# Patient Record
Sex: Male | Born: 1945 | Race: White | Hispanic: No | Marital: Married | Smoking: Never smoker
Health system: Southern US, Community
[De-identification: ages and names within clinical notes are randomized; demographics above are authoritative.]

## PROBLEM LIST (undated history)

## (undated) DIAGNOSIS — J302 Other seasonal allergic rhinitis: Secondary | ICD-10-CM

## (undated) DIAGNOSIS — I10 Essential (primary) hypertension: Secondary | ICD-10-CM

## (undated) DIAGNOSIS — K219 Gastro-esophageal reflux disease without esophagitis: Secondary | ICD-10-CM

## (undated) DIAGNOSIS — R7301 Impaired fasting glucose: Secondary | ICD-10-CM

## (undated) DIAGNOSIS — I2510 Atherosclerotic heart disease of native coronary artery without angina pectoris: Secondary | ICD-10-CM

## (undated) DIAGNOSIS — D369 Benign neoplasm, unspecified site: Secondary | ICD-10-CM

## (undated) HISTORY — DX: Impaired fasting glucose: R73.01

## (undated) HISTORY — DX: Benign neoplasm, unspecified site: D36.9

## (undated) HISTORY — DX: Atherosclerotic heart disease of native coronary artery without angina pectoris: I25.10

## (undated) HISTORY — PX: KNEE ARTHROSCOPY: SHX127

---

## 2005-05-17 ENCOUNTER — Other Ambulatory Visit: Admission: RE | Admit: 2005-05-17 | Discharge: 2005-05-17 | Payer: Self-pay | Admitting: Dermatology

## 2005-10-03 ENCOUNTER — Ambulatory Visit: Payer: Self-pay | Admitting: Internal Medicine

## 2005-10-03 ENCOUNTER — Ambulatory Visit (HOSPITAL_COMMUNITY): Admission: RE | Admit: 2005-10-03 | Discharge: 2005-10-03 | Payer: Self-pay | Admitting: Internal Medicine

## 2005-10-03 ENCOUNTER — Encounter: Payer: Self-pay | Admitting: Internal Medicine

## 2005-10-03 HISTORY — PX: COLONOSCOPY: SHX174

## 2011-04-06 NOTE — Op Note (Signed)
NAME:  Roger [name], Roger [name]              ACCOUNT NO.:  [id number]   MEDICAL RECORD NO.:  [id number]          PATIENT TYPE:  AMB   LOCATION:  DAY                           FACILITY:  [facility]   PHYSICIAN:  Roger [name], M.D. DATE OF BIRTH:  [birth year]   DATE OF PROCEDURE:  10/03/2005  DATE OF DISCHARGE:                                 OPERATIVE REPORT   PROCEDURE:  Colonoscopy with snare polypectomy and biopsy.   INDICATIONS FOR PROCEDURE:  The patient is a 65 year old gentleman sent over  at the courtesy of Dr. Nobie [name] for colorectal cancer screening. He is  devoid of any lower GI tract symptoms. He has never had his colon imaged.  There is no family history of colorectal neoplasia although his dad did have  history of liver cancer; whether this is primary or metastatic is unknown.  Colonoscopy is now being done as a screening maneuver. This approach has  been discussed with the patient at length. Potential risks, benefits, and  alternatives have been reviewed and questions answered. Please see  documentation in the medical record.   PROCEDURE NOTE:  O2 saturation, blood pressure, pulse, and respirations were  monitored throughout the entire procedure. Conscious sedation with Versed 4  mg IV and Demerol 75 mg IV in divided doses.   INSTRUMENT:  Olympus video chip system.   FINDINGS:  Digital rectal exam revealed no abnormalities.   ENDOSCOPIC FINDINGS:  Prep was adequate.   Rectum:  Examination of the rectal mucosa including retroflexed view of the  anal verge revealed a 3-mm polyp at 10 cm. Otherwise rectal mucosa appeared  normal.   Colon:  Colonic mucosa was surveyed from the rectosigmoid junction through  the left, transverse, and right colon to the area of the appendiceal  orifice, ileocecal valve, and cecum. These structures were well seen and  photographed for the record. From this level, the scope was slowly  withdrawn, and all previously mentioned mucosal surfaces were  again seen.  The patient was noted to have left sided diverticula. There was a 5-mm polyp  at the base of the ileocecal valve which was removed with cold snare  technique. There was a 3-mm diminutive polyp mid ascending colon which was  cold biopsied/removed. The remainder of the colonic mucosa appeared normal.  The lesion in the rectum was cold biopsied. The patient tolerated the  procedure well and was reactive to endoscopy.   IMPRESSION:  1.  Diminutive rectal polyp. Otherwise normal rectum status post removal of      cold biopsy technique.  2.  Left sided diverticula. Polyp at ileocecal valve removed with cold snare      technique. Mid ascending colon diminutive polyp removed with cold biopsy      technique. The remainder of colonic mucosa appeared normal.   RECOMMENDATIONS:  1.  No aspirin or arthritis medications for 10 days.  2.  Diverticulosis literature provided to Roger [name].  3.  Follow up on pathology.  4.  Further recommendations to follow.      Roger [name], M.D.  Electronically Signed     RMR/MEDQ  D:  10/03/2005  T:  10/03/2005  Job:  [id number]

## 2012-01-23 ENCOUNTER — Telehealth: Payer: Self-pay

## 2012-01-23 NOTE — Telephone Encounter (Signed)
Pt referred from Dr. Lubertha South for colonoscopy. Hx of adenomatous polyp. Needs OV prior to scheduling. LMOM for pt to return call.

## 2012-01-24 NOTE — Telephone Encounter (Signed)
LMOM for pt to return call. 

## 2012-01-29 ENCOUNTER — Encounter: Payer: Self-pay | Admitting: Internal Medicine

## 2012-01-29 NOTE — Telephone Encounter (Signed)
Pt called and left VM with his cell phone number of [phone], for a return call.

## 2012-01-29 NOTE — Telephone Encounter (Signed)
Called and spoke with pt's wife. She said they were out of town for a few days. Pt has the number and is to call today. She is aware that he does need OV prior to procedure. But she said he knows his schedule because he works out of town some. ( Last colonoscopy was 10/03/2005 by Dr. Jena Gauss. Adenomatous polyps).

## 2012-01-29 NOTE — Telephone Encounter (Signed)
Pt is scheduled for OV with [name], NP on 01/30/2012 @ 2:30 PM.

## 2012-01-30 ENCOUNTER — Ambulatory Visit (INDEPENDENT_AMBULATORY_CARE_PROVIDER_SITE_OTHER): Payer: Medicare Other | Admitting: Gastroenterology

## 2012-01-30 ENCOUNTER — Other Ambulatory Visit: Payer: Self-pay

## 2012-01-30 ENCOUNTER — Encounter: Payer: Self-pay | Admitting: Gastroenterology

## 2012-01-30 VITALS — BP 149/78 | HR 76 | Temp 98.5°F | Ht 72.0 in | Wt 199.2 lb

## 2012-01-30 DIAGNOSIS — D369 Benign neoplasm, unspecified site: Secondary | ICD-10-CM | POA: Diagnosis not present

## 2012-01-30 NOTE — Patient Instructions (Signed)
You have been set up for a colonoscopy with Dr. Jena Gauss.   Further recommendations to follow once this is completed.

## 2012-01-30 NOTE — Progress Notes (Signed)
Faxed to PCP

## 2012-01-30 NOTE — Progress Notes (Signed)
Primary Care Physician:  Harlow Asa, MD, MD Primary Gastroenterologist:  Dr. Jena Gauss  Chief Complaint  Patient presents with  . Colonoscopy    HPI:   Roger [name] is a very healthy 66 year old male who presents today for a visit prior to surveillance colonoscopy. Hx of adenomatous polyps in 2006. Denies any abdominal pain, rectal bleeding, change in bowel habits, wt loss, lack of appetite, or any other issues. He has no upper GI symptoms. Doing well.    Past Medical History  Diagnosis Date  . Adenomatous polyps     Past Surgical History  Procedure Date  . Colonoscopy 10/03/05    inflamed hyperplastic polyp/adenomatous poylp, left sided diverticula  . Knee surgery     both    Current Outpatient Prescriptions  Medication Sig Dispense Refill  . aspirin 81 MG tablet Take 81 mg by mouth daily.      . Multiple Vitamin (MULTIVITAMIN) capsule Take 1 capsule by mouth daily.        Allergies as of 01/30/2012  . (No Known Allergies)    Family History  Problem Relation Age of Onset  . Colon cancer      unsure  . Liver cancer Father     History   Social History  . Marital Status: Married    Spouse Name: N/A    Number of Children: N/A  . Years of Education: N/A   Occupational History  . Not on file.   Social History Main Topics  . Smoking status: Never Smoker   . Smokeless tobacco: Not on file  . Alcohol Use: Yes     2 glass of wine a week  . Drug Use: No  . Sexually Active: Not on file   Other Topics Concern  . Not on file   Social History Narrative  . No narrative on file    Review of Systems: Gen: Denies any fever, chills, fatigue, weight loss, lack of appetite.  CV: Denies chest pain, heart palpitations, peripheral edema, syncope.  Resp: Denies shortness of breath at rest or with exertion. Denies wheezing or cough.  GI: Denies dysphagia or odynophagia. Denies jaundice, hematemesis, fecal incontinence. GU : Denies urinary burning, urinary frequency,  urinary hesitancy MS: Denies joint pain, muscle weakness, cramps, or limitation of movement.  Derm: Denies rash, itching, dry skin Psych: Denies depression, anxiety, memory loss, and confusion Heme: Denies bruising, bleeding, and enlarged lymph nodes.  Physical Exam: BP 149/78  Pulse 76  Temp(Src) 98.5 F (36.9 C) (Temporal)  Ht 6' (1.829 m)  Wt 199 lb 3.2 oz (90.357 kg)  BMI 27.02 kg/m2 General:   Alert and oriented. Pleasant and cooperative. Well-nourished and well-developed.  Head:  Normocephalic and atraumatic. Eyes:  Without icterus, sclera clear and conjunctiva pink.  Ears:  Normal auditory acuity. Nose:  No deformity, discharge,  or lesions. Mouth:  No deformity or lesions, oral mucosa pink.  Neck:  Supple, without mass or thyromegaly. Lungs:  Clear to auscultation bilaterally. No wheezes, rales, or rhonchi. No distress.  Heart:  S1, S2 present without murmurs appreciated.  Abdomen:  +BS, soft, non-tender and non-distended. No HSM noted. No guarding or rebound. No masses appreciated.  Rectal:  Deferred  Msk:  Symmetrical without gross deformities. Normal posture. Extremities:  Without clubbing or edema. Neurologic:  Alert and  oriented x4;  grossly normal neurologically. Skin:  Intact without significant lesions or rashes. Cervical Nodes:  No significant cervical adenopathy. Psych:  Alert and cooperative. Normal mood and affect.

## 2012-01-30 NOTE — Assessment & Plan Note (Signed)
66 year old male with hx of adenomatous rectal polyp in 2006. Due for surveillance now. He is completely void of any lower or upper GI symptoms. We will proceed with a colonoscopy in very near future.  Proceed with TCS with Dr. Jena Gauss in near future: the risks, benefits, and alternatives have been discussed with the patient in detail. The patient states understanding and desires to proceed.

## 2012-02-20 ENCOUNTER — Telehealth: Payer: Self-pay

## 2012-02-20 NOTE — Telephone Encounter (Signed)
LM for pt to call to update meds and info for procedure on 03/03/2012 with Dr. Jena Gauss.

## 2012-02-21 NOTE — Telephone Encounter (Signed)
OK 

## 2012-02-21 NOTE — Telephone Encounter (Signed)
Called and spoke to pt's wife. She said he has had no change in medications/ vitamins since he was here. No new medical problems. He is scheduled for 03/03/2012 @ 10:15 AM with Dr. Jena Gauss.

## 2012-02-29 MED ORDER — SODIUM CHLORIDE 0.45 % IV SOLN
Freq: Once | INTRAVENOUS | Status: AC
  Administered 2012-03-03: 10:00:00 via INTRAVENOUS

## 2012-03-03 ENCOUNTER — Ambulatory Visit (HOSPITAL_COMMUNITY)
Admission: RE | Admit: 2012-03-03 | Discharge: 2012-03-03 | Disposition: A | Discharge status: Home / Self Care | Payer: BC Managed Care – PPO | Source: Ambulatory Visit | Attending: Internal Medicine | Admitting: Internal Medicine

## 2012-03-03 ENCOUNTER — Encounter (HOSPITAL_COMMUNITY)
Admission: RE | Disposition: A | Discharge status: Home / Self Care | Payer: Self-pay | Source: Ambulatory Visit | Attending: Internal Medicine

## 2012-03-03 ENCOUNTER — Encounter (HOSPITAL_COMMUNITY): Payer: Self-pay

## 2012-03-03 DIAGNOSIS — K621 Rectal polyp: Secondary | ICD-10-CM

## 2012-03-03 DIAGNOSIS — Z860100 Personal history of colon polyps, unspecified: Secondary | ICD-10-CM

## 2012-03-03 DIAGNOSIS — D369 Benign neoplasm, unspecified site: Secondary | ICD-10-CM

## 2012-03-03 DIAGNOSIS — K5730 Diverticulosis of large intestine without perforation or abscess without bleeding: Secondary | ICD-10-CM

## 2012-03-03 DIAGNOSIS — Z7982 Long term (current) use of aspirin: Secondary | ICD-10-CM | POA: Insufficient documentation

## 2012-03-03 DIAGNOSIS — Z1211 Encounter for screening for malignant neoplasm of colon: Secondary | ICD-10-CM

## 2012-03-03 DIAGNOSIS — D128 Benign neoplasm of rectum: Secondary | ICD-10-CM | POA: Insufficient documentation

## 2012-03-03 DIAGNOSIS — K620 Anal polyp: Secondary | ICD-10-CM

## 2012-03-03 HISTORY — PX: COLONOSCOPY: SHX5424

## 2012-03-03 HISTORY — DX: Other seasonal allergic rhinitis: J30.2

## 2012-03-03 SURGERY — COLONOSCOPY
Anesthesia: Moderate Sedation

## 2012-03-03 MED ORDER — MIDAZOLAM HCL 5 MG/5ML IJ SOLN
INTRAMUSCULAR | Status: DC | PRN
  Administered 2012-03-03: 2 mg via INTRAVENOUS
  Administered 2012-03-03: 1 mg via INTRAVENOUS
  Administered 2012-03-03: 2 mg via INTRAVENOUS

## 2012-03-03 MED ORDER — MEPERIDINE HCL 100 MG/ML IJ SOLN
INTRAMUSCULAR | Status: AC
  Filled 2012-03-03: qty 1

## 2012-03-03 MED ORDER — MIDAZOLAM HCL 5 MG/5ML IJ SOLN
INTRAMUSCULAR | Status: AC
  Filled 2012-03-03: qty 10

## 2012-03-03 MED ORDER — MEPERIDINE HCL 100 MG/ML IJ SOLN
INTRAMUSCULAR | Status: DC | PRN
  Administered 2012-03-03: 50 mg via INTRAVENOUS
  Administered 2012-03-03 (×2): 25 mg via INTRAVENOUS

## 2012-03-03 MED ORDER — STERILE WATER FOR IRRIGATION IR SOLN
Status: DC | PRN
  Administered 2012-03-03: 10:00:00

## 2012-03-03 NOTE — Op Note (Signed)
[facility] [address] [place], [place]  [address]  COLONOSCOPY PROCEDURE REPORT  PATIENT:  [name], Roger  MR#:  [id number] BIRTHDATE:  February 28, 1946, 66 yrs. old  GENDER:  male ENDOSCOPIST:  R. Roetta Sessions, MD FACP [facility] REF. BY:  Simone Curia, M.D. PROCEDURE DATE:  03/03/2012 PROCEDURE:  Ileocolonoscopy  with snare polypectomy  INDICATIONS:  History of colonic adenoma; here for surveillance examination  INFORMED CONSENT:  The risks, benefits, alternatives and imponderables including but not limited to bleeding, perforation as well as the possibility of a missed lesion have been reviewed. The potential for biopsy, lesion removal, etc. have also been discussed.  Questions have been answered.  All parties agreeable. Please see the history and physical in the medical record for more information.  MEDICATIONS:  Versed 5 mg IV and Demerol 100 mg IV in divided doses.  DESCRIPTION OF PROCEDURE:  After a digital rectal exam was performed, the EC-[id number] ([id number]) colonoscope was advanced from the anus through the rectum and colon to the area of the cecum, ileocecal valve and appendiceal orifice.  The cecum was deeply intubated.  These structures were well-seen and photographed for the record.  From the level of the cecum and ileocecal valve, the scope was slowly and cautiously withdrawn.  The mucosal surfaces were carefully surveyed utilizing scope tip deflection to facilitate fold flattening as needed.  The scope was pulled down into the rectum where a thorough examination including retroflexion was performed. <<PROCEDUREIMAGES>>  FINDINGS:    Adequate preparation. 8 mm polyp-pedunculated in the rectum at 4 cm from the anal verge. Otherwise, the rectal mucosa appeared normal. Left-sided diverticulosis; remainder of colonic mucosa and distal 10 cm of terminal ileal mucosa appeared normal  THERAPEUTIC / DIAGNOSTIC MANEUVERS PERFORMED:  The rectal polyp was  completely removed with one pass of a hot snare cautery.  COMPLICATIONS:  None  CECAL WITHDRAWAL TIME:     12 minutes  IMPRESSION:    Rectal polyp-removed as described above. Colonic diverticulosis.  RECOMMENDATIONS:   Followup on pathology  ______________________________ R. Roetta Sessions, MD Caleen Essex  CC:  Simone Curia, M.D.  n. eSIGNED:   R. Roetta Sessions at 03/03/2012 10:58 AM  Kellie Simmering, [phone]

## 2012-03-03 NOTE — Discharge Instructions (Signed)
Colonoscopy Care After Read the instructions outlined below and refer to this sheet in the next few weeks. These discharge instructions provide you with general information on caring for yourself after you leave the hospital. Your doctor may also give you specific instructions. While your treatment has been planned according to the most current medical practices available, unavoidable complications occasionally occur. If you have any problems or questions after discharge, call your doctor. HOME CARE INSTRUCTIONS ACTIVITY:  You may resume your regular activity, but move at a slower pace for the next 24 hours.   Take frequent rest periods for the next 24 hours.   Walking will help get rid of the air and reduce the bloated feeling in your belly (abdomen).   No driving for 24 hours (because of the medicine (anesthesia) used during the test).   You may shower.   Do not sign any important legal documents or operate any machinery for 24 hours (because of the anesthesia used during the test).  NUTRITION:  Drink plenty of fluids.   You may resume your normal diet as instructed by your doctor.   Begin with a light meal and progress to your normal diet. Heavy or fried foods are harder to digest and may make you feel sick to your stomach (nauseated).   Avoid alcoholic beverages for 24 hours or as instructed.  MEDICATIONS:  You may resume your normal medications unless your doctor tells you otherwise.  WHAT TO EXPECT TODAY:  Some feelings of bloating in the abdomen.   Passage of more gas than usual.   Spotting of blood in your stool or on the toilet paper.  IF YOU HAD POLYPS REMOVED DURING THE COLONOSCOPY:  No aspirin products for 7 days or as instructed.   No alcohol for 7 days or as instructed.   Eat a soft diet for the next 24 hours.  FINDING OUT THE RESULTS OF YOUR TEST Not all test results are available during your visit. If your test results are not back during the visit, make an  appointment with your caregiver to find out the results. Do not assume everything is normal if you have not heard from your caregiver or the medical facility. It is important for you to follow up on all of your test results.  SEEK IMMEDIATE MEDICAL CARE IF:  You have more than a spotting of blood in your stool.   Your belly is swollen (abdominal distention).   You are nauseated or vomiting.   You have a fever.   You have abdominal pain or discomfort that is severe or gets worse throughout the day.  Document Released: 06/19/2004 Document Revised: 10/25/2011 Document Reviewed: 06/17/2008 [facility] Patient Information 2012 [place], [place].   Colonoscopy Discharge Instructions  Read the instructions outlined below and refer to this sheet in the next few weeks. These discharge instructions provide you with general information on caring for yourself after you leave the hospital. Your doctor may also give you specific instructions. While your treatment has been planned according to the most current medical practices available, unavoidable complications occasionally occur. If you have any problems or questions after discharge, call Dr. Jena Gauss at [phone]. ACTIVITY  You may resume your regular activity, but move at a slower pace for the next 24 hours.   Take frequent rest periods for the next 24 hours.   Walking will help get rid of the air and reduce the bloated feeling in your belly (abdomen).   No driving for 24 hours (because of  the medicine (anesthesia) used during the test).    Do not sign any important legal documents or operate any machinery for 24 hours (because of the anesthesia used during the test).  NUTRITION  Drink plenty of fluids.   You may resume your normal diet as instructed by your doctor.   Begin with a light meal and progress to your normal diet. Heavy or fried foods are harder to digest and may make you feel sick to your stomach (nauseated).   Avoid alcoholic  beverages for 24 hours or as instructed.  MEDICATIONS  You may resume your normal medications unless your doctor tells you otherwise.  WHAT YOU CAN EXPECT TODAY  Some feelings of bloating in the abdomen.   Passage of more gas than usual.   Spotting of blood in your stool or on the toilet paper.  IF YOU HAD POLYPS REMOVED DURING THE COLONOSCOPY:  No aspirin products for 7 days or as instructed.   No alcohol for 7 days or as instructed.   Eat a soft diet for the next 24 hours.  FINDING OUT THE RESULTS OF YOUR TEST Not all test results are available during your visit. If your test results are not back during the visit, make an appointment with your caregiver to find out the results. Do not assume everything is normal if you have not heard from your caregiver or the medical facility. It is important for you to follow up on all of your test results.  SEEK IMMEDIATE MEDICAL ATTENTION IF:  You have more than a spotting of blood in your stool.   Your belly is swollen (abdominal distention).   You are nauseated or vomiting.   You have a temperature over 101.   You have abdominal pain or discomfort that is severe or gets worse throughout the day.    Diverticulosis and polyp information provided to the patient by nursing staff. Further recommendations to follow pending review of pathology report. Colon Polyps A polyp is extra tissue that grows inside your body. Colon polyps grow in the large intestine. The large intestine, also called the colon, is part of your digestive system. It is a long, hollow tube at the end of your digestive tract where your body makes and stores stool. Most polyps are not dangerous. They are benign. This means they are not cancerous. But over time, some types of polyps can turn into cancer. Polyps that are smaller than a pea are usually not harmful. But larger polyps could someday become or may already be cancerous. To be safe, doctors remove all polyps and test  them.  WHO GETS POLYPS? Anyone can get polyps, but certain people are more likely than others. You may have a greater chance of getting polyps if:  You are over 50.   You have had polyps before.   Someone in your family has had polyps.   Someone in your family has had cancer of the large intestine.   Find out if someone in your family has had polyps. You may also be more likely to get polyps if you:   Eat a lot of fatty foods.   Smoke.   Drink alcohol.   Do not exercise.   Eat too much.  SYMPTOMS  Most small polyps do not cause symptoms. People often do not know they have one until their caregiver finds it during a regular checkup or while testing them for something else. Some people do have symptoms like these:  Bleeding from the anus.  You might notice blood on your underwear or on toilet paper after you have had a bowel movement.   Constipation or diarrhea that lasts more than a week.   Blood in the stool. Blood can make stool look black or it can show up as red streaks in the stool.  If you have any of these symptoms, see your caregiver. HOW DOES THE DOCTOR TEST FOR POLYPS? The doctor can use four tests to check for polyps:  Digital rectal exam. The caregiver wears gloves and checks your rectum (the last part of the large intestine) to see if it feels normal. This test would find polyps only in the rectum. Your caregiver may need to do one of the other tests listed below to find polyps higher up in the intestine.   Barium enema. The caregiver puts a liquid called barium into your rectum before taking x-rays of your large intestine. Barium makes your intestine look white in the pictures. Polyps are dark, so they are easy to see.   Sigmoidoscopy. With this test, the caregiver can see inside your large intestine. A thin flexible tube is placed into your rectum. The device is called a sigmoidoscope, which has a light and a tiny video camera in it. The caregiver uses the  sigmoidoscope to look at the last third of your large intestine.   Colonoscopy. This test is like sigmoidoscopy, but the caregiver looks at all of the large intestine. It usually requires sedation. This is the most common method for finding and removing polyps.  TREATMENT   The caregiver will remove the polyp during sigmoidoscopy or colonoscopy. The polyp is then tested for cancer.   If you have had polyps, your caregiver may want you to get tested regularly in the future.  PREVENTION  There is not one sure way to prevent polyps. You might be able to lower your risk of getting them if you:  Eat more fruits and vegetables and less fatty food.   Do not smoke.   Avoid alcohol.   Exercise every day.   Lose weight if you are overweight.   Eating more calcium and folate can also lower your risk of getting polyps. Some foods that are rich in calcium are milk, cheese, and broccoli. Some foods that are rich in folate are chickpeas, kidney beans, and spinach.   Aspirin might help prevent polyps. Studies are under way.  Document Released: 08/01/2004 Document Revised: 10/25/2011 Document Reviewed: 01/07/2008 [facility] Patient Information 2012 [place], [place].  Diverticulosis Diverticulosis is a common condition that develops when small pouches (diverticula) form in the wall of the colon. The risk of diverticulosis increases with age. It happens more often in people who eat a low-fiber diet. Most individuals with diverticulosis have no symptoms. Those individuals with symptoms usually experience abdominal pain, constipation, or loose stools (diarrhea). HOME CARE INSTRUCTIONS   Increase the amount of fiber in your diet as directed by your caregiver or dietician. This may reduce symptoms of diverticulosis.   Your caregiver may recommend taking a dietary fiber supplement.   Drink at least 6 to 8 glasses of water each day to prevent constipation.   Try not to strain when you have a bowel movement.    Your caregiver may recommend avoiding nuts and seeds to prevent complications, although this is still an uncertain benefit.   Only take over-the-counter or prescription medicines for pain, discomfort, or fever as directed by your caregiver.  FOODS WITH HIGH FIBER CONTENT INCLUDE:  Fruits. Apple, peach,  pear, tangerine, raisins, prunes.   Vegetables. Brussels sprouts, asparagus, broccoli, cabbage, carrot, cauliflower, romaine lettuce, spinach, summer squash, tomato, winter squash, zucchini.   Starchy Vegetables. Baked beans, kidney beans, lima beans, split peas, lentils, potatoes (with skin).   Grains. Whole wheat bread, brown rice, bran flake cereal, plain oatmeal, white rice, shredded wheat, bran muffins.  SEEK IMMEDIATE MEDICAL CARE IF:   You develop increasing pain or severe bloating.   You have an oral temperature above 102 F (38.9 C), not controlled by medicine.   You develop vomiting or bowel movements that are bloody or black.  Document Released: 08/02/2004 Document Revised: 10/25/2011 Document Reviewed: 04/05/2010 [facility] Patient Information 2012 [place], [place].

## 2012-03-03 NOTE — H&P (Signed)
Primary Care Physician: Harlow Asa, MD, MD  Primary Gastroenterologist: Dr. Jena Gauss  Chief Complaint   Patient presents with   .  Colonoscopy    HPI:  Mr. Roger [name] is a very healthy 66 year old male who presents today for a visit prior to surveillance colonoscopy. Hx of adenomatous polyps in 2006. Denies any abdominal pain, rectal bleeding, change in bowel habits, wt loss, lack of appetite, or any other issues. He has no upper GI symptoms. Doing well.  Past Medical History   Diagnosis  Date   .  Adenomatous polyps     Past Surgical History   Procedure  Date   .  Colonoscopy  10/03/05     inflamed hyperplastic polyp/adenomatous poylp, left sided diverticula   .  Knee surgery      both    Current Outpatient Prescriptions   Medication  Sig  Dispense  Refill   .  aspirin 81 MG tablet  Take 81 mg by mouth daily.     .  Multiple Vitamin (MULTIVITAMIN) capsule  Take 1 capsule by mouth daily.      Allergies as of 01/30/2012   .  (No Known Allergies)    Family History   Problem  Relation  Age of Onset   .  Colon cancer        unsure    .  Liver cancer  Father     History    Social History   .  Marital Status:  Married     Spouse Name:  N/A     Number of Children:  N/A   .  Years of Education:  N/A    Occupational History   .  Not on file.    Social History Main Topics   .  Smoking status:  Never Smoker   .  Smokeless tobacco:  Not on file   .  Alcohol Use:  Yes      2 glass of wine a week   .  Drug Use:  No   .  Sexually Active:  Not on file    Other Topics  Concern   .  Not on file    Social History Narrative   .  No narrative on file    Review of Systems:  Gen: Denies any fever, chills, fatigue, weight loss, lack of appetite.  CV: Denies chest pain, heart palpitations, peripheral edema, syncope.  Resp: Denies shortness of breath at rest or with exertion. Denies wheezing or cough.  GI: Denies dysphagia or odynophagia. Denies jaundice, hematemesis, fecal  incontinence.  GU : Denies urinary burning, urinary frequency, urinary hesitancy  MS: Denies joint pain, muscle weakness, cramps, or limitation of movement.  Derm: Denies rash, itching, dry skin  Psych: Denies depression, anxiety, memory loss, and confusion  Heme: Denies bruising, bleeding, and enlarged lymph nodes.  Physical Exam:  General: Alert and oriented. Pleasant and cooperative. Well-nourished and well-developed.  Head: Normocephalic and atraumatic.  Eyes: Without icterus, sclera clear and conjunctiva pink.  Ears: Normal auditory acuity.  Nose: No deformity, discharge, or lesions.  Mouth: No deformity or lesions, oral mucosa pink.  Neck: Supple, without mass or thyromegaly.  Lungs: Clear to auscultation bilaterally. No wheezes, rales, or rhonchi. No distress.  Heart: S1, S2 present without murmurs appreciated.  Abdomen: +BS, soft, non-tender and non-distended. No HSM noted. No guarding or rebound. No masses appreciated.  Rectal: Deferred  Msk: Symmetrical without gross deformities. Normal posture.  Extremities: Without clubbing or edema.  Neurologic:  Alert and oriented x4; grossly normal neurologically.  Skin: Intact without significant lesions or rashes.  Cervical Nodes: No significant cervical adenopathy.  Psych: Alert and cooperative. Normal mood and affect.     Adenomatous polyps - Roger Halls, NP 66 year old male with hx of adenomatous rectal polyp in 2006. Due for surveillance now. He is completely void of any lower or upper GI symptoms. We will proceed with a colonoscopy in very near future.  Proceed with TCS in near future: the risks, benefits, and alternatives have been discussed with the patient in detail. The patient states understanding and desires to proceed.

## 2012-03-05 ENCOUNTER — Encounter (HOSPITAL_COMMUNITY): Payer: Self-pay | Admitting: Internal Medicine

## 2012-03-07 ENCOUNTER — Encounter: Payer: Self-pay | Admitting: Internal Medicine

## 2012-03-10 ENCOUNTER — Encounter: Payer: Self-pay | Admitting: Internal Medicine

## 2012-04-03 DIAGNOSIS — D235 Other benign neoplasm of skin of trunk: Secondary | ICD-10-CM | POA: Diagnosis not present

## 2012-04-03 DIAGNOSIS — L570 Actinic keratosis: Secondary | ICD-10-CM | POA: Diagnosis not present

## 2012-09-09 DIAGNOSIS — Z23 Encounter for immunization: Secondary | ICD-10-CM | POA: Diagnosis not present

## 2012-10-08 DIAGNOSIS — E782 Mixed hyperlipidemia: Secondary | ICD-10-CM | POA: Diagnosis not present

## 2012-10-08 DIAGNOSIS — R5381 Other malaise: Secondary | ICD-10-CM | POA: Diagnosis not present

## 2012-10-08 DIAGNOSIS — Z125 Encounter for screening for malignant neoplasm of prostate: Secondary | ICD-10-CM | POA: Diagnosis not present

## 2012-10-14 DIAGNOSIS — Z23 Encounter for immunization: Secondary | ICD-10-CM | POA: Diagnosis not present

## 2012-10-14 DIAGNOSIS — Z0000 Encounter for general adult medical examination without abnormal findings: Secondary | ICD-10-CM | POA: Diagnosis not present

## 2013-02-23 DIAGNOSIS — C4441 Basal cell carcinoma of skin of scalp and neck: Secondary | ICD-10-CM | POA: Diagnosis not present

## 2013-02-23 DIAGNOSIS — L570 Actinic keratosis: Secondary | ICD-10-CM | POA: Diagnosis not present

## 2013-02-23 DIAGNOSIS — D235 Other benign neoplasm of skin of trunk: Secondary | ICD-10-CM | POA: Diagnosis not present

## 2013-03-30 DIAGNOSIS — Z85828 Personal history of other malignant neoplasm of skin: Secondary | ICD-10-CM | POA: Diagnosis not present

## 2013-06-03 DIAGNOSIS — L259 Unspecified contact dermatitis, unspecified cause: Secondary | ICD-10-CM | POA: Diagnosis not present

## 2013-08-21 DIAGNOSIS — Z23 Encounter for immunization: Secondary | ICD-10-CM | POA: Diagnosis not present

## 2013-09-09 DIAGNOSIS — H524 Presbyopia: Secondary | ICD-10-CM | POA: Diagnosis not present

## 2013-09-09 DIAGNOSIS — H52229 Regular astigmatism, unspecified eye: Secondary | ICD-10-CM | POA: Diagnosis not present

## 2013-09-09 DIAGNOSIS — H5200 Hypermetropia, unspecified eye: Secondary | ICD-10-CM | POA: Diagnosis not present

## 2013-09-09 DIAGNOSIS — H43399 Other vitreous opacities, unspecified eye: Secondary | ICD-10-CM | POA: Diagnosis not present

## 2013-10-07 ENCOUNTER — Telehealth: Payer: Self-pay | Admitting: Family Medicine

## 2013-10-07 DIAGNOSIS — Z79899 Other long term (current) drug therapy: Secondary | ICD-10-CM

## 2013-10-07 DIAGNOSIS — Z125 Encounter for screening for malignant neoplasm of prostate: Secondary | ICD-10-CM

## 2013-10-07 DIAGNOSIS — E785 Hyperlipidemia, unspecified: Secondary | ICD-10-CM

## 2013-10-07 NOTE — Telephone Encounter (Signed)
Patient needs Bw paperwork °

## 2013-10-08 ENCOUNTER — Other Ambulatory Visit: Payer: Self-pay | Admitting: *Deleted

## 2013-10-08 NOTE — Telephone Encounter (Signed)
Lip liv m7 plus psa plus appt

## 2013-10-08 NOTE — Telephone Encounter (Signed)
Pt notified orders are ready. He can go over to lab and have bloodwork done. He has a follow up appt.

## 2013-10-09 DIAGNOSIS — E785 Hyperlipidemia, unspecified: Secondary | ICD-10-CM | POA: Diagnosis not present

## 2013-10-09 DIAGNOSIS — Z79899 Other long term (current) drug therapy: Secondary | ICD-10-CM | POA: Diagnosis not present

## 2013-10-09 DIAGNOSIS — Z125 Encounter for screening for malignant neoplasm of prostate: Secondary | ICD-10-CM | POA: Diagnosis not present

## 2013-10-09 LAB — LIPID PANEL
Total CHOL/HDL Ratio: 3.4 Ratio
VLDL: 24 mg/dL (ref 0–40)

## 2013-10-09 LAB — BASIC METABOLIC PANEL
CO2: 29 mEq/L (ref 19–32)
Glucose, Bld: 96 mg/dL (ref 70–99)
Potassium: 4.2 mEq/L (ref 3.5–5.3)
Sodium: 142 mEq/L (ref 135–145)

## 2013-10-09 LAB — HEPATIC FUNCTION PANEL
AST: 24 U/L (ref 0–37)
Bilirubin, Direct: 0.1 mg/dL (ref 0.0–0.3)
Total Bilirubin: 0.7 mg/dL (ref 0.3–1.2)

## 2013-10-17 ENCOUNTER — Encounter: Payer: Self-pay | Admitting: *Deleted

## 2013-10-21 ENCOUNTER — Encounter: Payer: Self-pay | Admitting: Family Medicine

## 2013-10-21 ENCOUNTER — Ambulatory Visit (INDEPENDENT_AMBULATORY_CARE_PROVIDER_SITE_OTHER): Payer: Medicare Other | Admitting: Family Medicine

## 2013-10-21 VITALS — BP 146/84 | Ht 72.0 in | Wt 202.6 lb

## 2013-10-21 DIAGNOSIS — Z0000 Encounter for general adult medical examination without abnormal findings: Secondary | ICD-10-CM

## 2013-10-21 NOTE — Progress Notes (Signed)
Subjective:    Patient ID: Roger [name], male    DOB: [birth year], 67 y.o.   MRN: [id number]  HPI AWV- Annual Wellness Visit  The patient was seen for their annual wellness visit. The patient's past medical history, surgical history, and family history were reviewed. Pertinent vaccines were reviewed ( tetanus, pneumonia, shingles, flu) The patient's medication list was reviewed and updated. The height and weight were entered. The patient's current BMI is: 27  Waist: 36in Cognitive screening was completed. Outcome of Mini - Cog: PASS  Falls within the past 6 months: NEGATIVE Current tobacco usage: NEGATIVE (All patients who use tobacco were given written and verbal information on quitting)  Recent listing of emergency department/hospitalizations over the past year were reviewed.  current specialist the patient sees on a regular basis:   Medicare annual wellness visit patient questionnaire was reviewed.  A written screening schedule for the patient for the next 5-10 years was given. Appropriate discussion of followup regarding next visit was discussed.   Spring 2013 of   Diet eats the right stuff, but too much Results for orders placed in visit on 10/07/13  LIPID PANEL      Result Value Range   Cholesterol 188  0 - 200 mg/dL   Triglycerides 409  <811 mg/dL   HDL 55  >91 mg/dL   Total CHOL/HDL Ratio 3.4     VLDL 24  0 - 40 mg/dL   LDL Cholesterol 478 (*) 0 - 99 mg/dL  HEPATIC FUNCTION PANEL      Result Value Range   Total Bilirubin 0.7  0.3 - 1.2 mg/dL   Bilirubin, Direct 0.1  0.0 - 0.3 mg/dL   Indirect Bilirubin 0.6  0.0 - 0.9 mg/dL   Alkaline Phosphatase 74  39 - 117 U/L   AST 24  0 - 37 U/L   ALT 23  0 - 53 U/L   Total Protein 6.9  6.0 - 8.3 g/dL   Albumin 4.2  3.5 - 5.2 g/dL  BASIC METABOLIC PANEL      Result Value Range   Sodium 142  135 - 145 mEq/L   Potassium 4.2  3.5 - 5.3 mEq/L   Chloride 103  96 - 112 mEq/L   CO2 29  19 - 32 mEq/L   Glucose, Bld 96   70 - 99 mg/dL   BUN 19  6 - 23 mg/dL   Creat 2.95  6.21 - 3.08 mg/dL   Calcium 9.0  8.4 - 65.7 mg/dL  PSA      Result Value Range   PSA 0.36  <=4.00 ng/mL    Takes red yeat rice extract, take all at once  exefcise five out of seven days--exercising walking and working at it  Review of Systems  Constitutional: Negative for fever, activity change and appetite change.  HENT: Negative for congestion and rhinorrhea.   Eyes: Negative for discharge.  Respiratory: Negative for cough and wheezing.   Cardiovascular: Negative for chest pain.  Gastrointestinal: Negative for vomiting, abdominal pain and blood in stool.  Genitourinary: Negative for frequency and difficulty urinating.  Musculoskeletal: Negative for neck pain.  Skin: Negative for rash.  Allergic/Immunologic: Negative for environmental allergies and food allergies.  Neurological: Negative for weakness and headaches.  Psychiatric/Behavioral: Negative for agitation.       Objective:   Physical Exam  Vitals reviewed. Constitutional: He appears well-developed and well-nourished.  HENT:  Head: Normocephalic and atraumatic.  Right Ear: External ear normal.  Left  Ear: External ear normal.  Nose: Nose normal.  Mouth/Throat: Oropharynx is clear and moist.  Eyes: EOM are normal. Pupils are equal, round, and reactive to light.  Neck: Normal range of motion. Neck supple. No thyromegaly present.  Cardiovascular: Normal rate, regular rhythm and normal heart sounds.   No murmur heard. Pulmonary/Chest: Effort normal and breath sounds normal. No respiratory distress. He has no wheezes.  Abdominal: Soft. Bowel sounds are normal. He exhibits no distension and no mass. There is no tenderness.  Genitourinary: Penis normal.  Musculoskeletal: Normal range of motion. He exhibits no edema.  Lymphadenopathy:    He has no cervical adenopathy.  Neurological: He is alert. He exhibits normal muscle tone.  Skin: Skin is warm and dry. No erythema.    Psychiatric: He has a normal mood and affect. His behavior is normal. Judgment normal.   Skin multiple benign lesions       Assessment & Plan:  Impression 1 wellness exam. #2 borderline hyperlipidemia discussed #3 multiple benign lesions plan diet exercise discussed maintain same meds. Plan diet exercise discussed in encourage. Check yearly. WSL

## 2013-11-18 ENCOUNTER — Encounter: Payer: Self-pay | Admitting: Nurse Practitioner

## 2013-11-18 ENCOUNTER — Ambulatory Visit (INDEPENDENT_AMBULATORY_CARE_PROVIDER_SITE_OTHER): Payer: Medicare Other | Admitting: Nurse Practitioner

## 2013-11-18 VITALS — BP 120/86 | Temp 98.2°F | Ht 72.0 in | Wt 200.2 lb

## 2013-11-18 DIAGNOSIS — J069 Acute upper respiratory infection, unspecified: Secondary | ICD-10-CM

## 2013-11-18 MED ORDER — AZITHROMYCIN 250 MG PO TABS: ORAL_TABLET | ORAL | Status: DC

## 2013-11-20 ENCOUNTER — Encounter: Payer: Self-pay | Admitting: Nurse Practitioner

## 2013-11-20 NOTE — Progress Notes (Signed)
Subjective:  Presents complaints of congestion and cough that began 2 days ago. Frequent cough, slightly productive. No fever. Frontal area headache. Some bodyaches particularly around the trunk area. No specific chest pain. No wheezing or shortness of breath. No chest pain with activity. Right ear pressure. No sore throat. No vomiting diarrhea or abdominal pain. Taking fluids well.  Objective:   BP 120/86  Temp(Src) 98.2 F (36.8 C) (Oral)  Ht 6' (1.829 m)  Wt 200 lb 4 oz (90.833 kg)  BMI 27.15 kg/m2 NAD. Alert, oriented. TMs significant clear effusion, no erythema. Pharynx injected with PND noted. Neck supple with mild soft nontender adenopathy. Lungs clear. Heart regular rate rhythm.  Assessment:Acute upper respiratory infections of unspecified site  Plan: Meds ordered this encounter  Medications  . azithromycin (ZITHROMAX Z-PAK) 250 MG tablet    Sig: Take 2 tablets (500 mg) on  Day 1,  followed by 1 tablet (250 mg) once daily on Days 2 through 5.    Dispense:  6 each    Refill:  0    Order Specific Question:  Supervising Provider    Answer:  Mikey Kirschner [[id number]]   OTC meds as directed for symptomatic care. Reviewed warning signs. Call back in 5-7 days if no improvement, sooner if worse.

## 2013-12-28 DIAGNOSIS — Z85828 Personal history of other malignant neoplasm of skin: Secondary | ICD-10-CM | POA: Diagnosis not present

## 2013-12-28 DIAGNOSIS — L570 Actinic keratosis: Secondary | ICD-10-CM | POA: Diagnosis not present

## 2013-12-28 DIAGNOSIS — D235 Other benign neoplasm of skin of trunk: Secondary | ICD-10-CM | POA: Diagnosis not present

## 2013-12-28 DIAGNOSIS — L820 Inflamed seborrheic keratosis: Secondary | ICD-10-CM | POA: Diagnosis not present

## 2014-06-30 ENCOUNTER — Encounter: Payer: Self-pay | Admitting: Family Medicine

## 2014-06-30 ENCOUNTER — Ambulatory Visit (INDEPENDENT_AMBULATORY_CARE_PROVIDER_SITE_OTHER): Payer: Medicare Other | Admitting: Family Medicine

## 2014-06-30 VITALS — BP 138/82 | Ht 72.0 in | Wt 195.8 lb

## 2014-06-30 DIAGNOSIS — R079 Chest pain, unspecified: Secondary | ICD-10-CM

## 2014-06-30 NOTE — Progress Notes (Signed)
   Subjective:    Patient ID: Roger [name], male    DOB: 07/11/1946, 68 y.o.   MRN: [id number]  HPI  Patient arrives for a check of possible tick on right thigh.  First started noticing , felt a knot ane muscle tightness  Walking and lawn mowing  Tying up plants  Not exercising , some sense of shortness of breath. Also some sweatiness. Though difficult to say with current climate.  Feels tight and heavy, no pain per se,  Started on low dose aspirin, bro was on it and asked and led to that  fam [place] 119[address] (smoked early on)  Sister has history of heart arrhythmias.   Review of Systems No headache no nausea no domino pain.    Objective:   Physical Exam Alert no acute distress. Vitals stable. Lungs clear. Heart regular rate and rhythm HEENT normal. Abdomen benign. Initial leg lesion that brought patient in is a normal nevus with superimposed small ecchymosis  EKG nonspecific anterolateral ST-T changes with normal sinus rhythm       Assessment & Plan:  Impression chest discomfort with exertion. Some features are atypical, however definitely enough to want further investigation. Discussed at length. Plan cardiology referral. Rationale discussed. EKG faxed to office. WSL

## 2014-07-05 ENCOUNTER — Ambulatory Visit (INDEPENDENT_AMBULATORY_CARE_PROVIDER_SITE_OTHER): Payer: Medicare Other | Admitting: Cardiovascular Disease

## 2014-07-05 ENCOUNTER — Encounter: Payer: Self-pay | Admitting: Cardiovascular Disease

## 2014-07-05 ENCOUNTER — Encounter: Payer: Self-pay | Admitting: *Deleted

## 2014-07-05 VITALS — BP 173/91 | HR 76 | Ht 72.0 in | Wt 195.0 lb

## 2014-07-05 DIAGNOSIS — IMO0001 Reserved for inherently not codable concepts without codable children: Secondary | ICD-10-CM

## 2014-07-05 DIAGNOSIS — R0602 Shortness of breath: Secondary | ICD-10-CM | POA: Diagnosis not present

## 2014-07-05 DIAGNOSIS — R9431 Abnormal electrocardiogram [ECG] [EKG]: Secondary | ICD-10-CM | POA: Diagnosis not present

## 2014-07-05 DIAGNOSIS — R030 Elevated blood-pressure reading, without diagnosis of hypertension: Secondary | ICD-10-CM

## 2014-07-05 DIAGNOSIS — R079 Chest pain, unspecified: Secondary | ICD-10-CM | POA: Insufficient documentation

## 2014-07-05 MED ORDER — NITROGLYCERIN 0.4 MG SL SUBL: 0.4000 mg | SUBLINGUAL_TABLET | SUBLINGUAL | Status: DC | PRN

## 2014-07-05 NOTE — Progress Notes (Signed)
Patient ID: TRA WILEMON, male   DOB: 1946-06-23, 68 y.o.   MRN: [id number]       CARDIOLOGY CONSULT NOTE  Patient ID: EVERTTE SONES MRN: [id number] DOB/AGE: 05-30-1946 68 y.o.  Admit date: (Not on file) Primary Physician Rubbie Battiest, MD  Reason for Consultation: chest pain  HPI: The patient is a 68 year old male with a history of hyperlipidemia who has been referred by Dr. Mickie Hillier for the evaluation of chest pain. Approximately two weeks ago while he was out walking, he began to experience retrosternal chest tightness which radiated to the upper left and right regions of his chest. It was accompanied by mild shortness of breath. He denies associated nausea, dizziness and lightheadedness. It again occurred two days later while he was out walking. Each episode lasted approximately 4-5 minutes. He again had an experience while he was mowing his lawn. He denies a history of orthopnea, leg swelling and paroxysmal nocturnal dyspnea. He tries to walk several times per week. He has never smoked. He said his cholesterol is normally well controlled and he denies a history of high blood pressure. He does say that whenever his blood pressures checked with an automated cuff, systolic readings have been as high as 170 to 190 mm mercury. However he said his blood pressure was checked last week at his primary care physician's office and it was 130/80. An ECG performed at his primary care physician's office demonstrated normal sinus rhythm with a nonspecific ST segment and T wave abnormality.  Soc: Nonsmoker. Worked as a Systems developer.  Fam: Father had MI at age 22.  No Known Allergies  Current Outpatient Prescriptions  Medication Sig Dispense Refill  . aspirin 81 MG tablet Take 81 mg by mouth daily.      . cetirizine (ZYRTEC) 10 MG tablet Take 10 mg by mouth daily as needed for allergies.      . Multiple Vitamin (MULTIVITAMIN) capsule Take 1 capsule by mouth daily.      . Red Yeast Rice 600 MG  CAPS Take 1 capsule by mouth daily.        No current facility-administered medications for this visit.    Past Medical History  Diagnosis Date  . Adenomatous polyps   . Seasonal allergies   . Impaired fasting glucose     Past Surgical History  Procedure Laterality Date  . Colonoscopy  10/03/05    inflamed hyperplastic polyp/adenomatous poylp, left sided diverticula  . Knee surgery      both  . Colonoscopy  03/03/2012    Procedure: COLONOSCOPY;  Surgeon: Daneil Dolin, MD;  Location: [facility];  Service: Endoscopy;  Laterality: N/A;  10:15    History   Social History  . Marital Status: Married    Spouse Name: N/A    Number of Children: N/A  . Years of Education: N/A   Occupational History  . Not on file.   Social History Main Topics  . Smoking status: Never Smoker   . Smokeless tobacco: Never Used  . Alcohol Use: Yes     Comment: 2 glass of wine a week  . Drug Use: No  . Sexual Activity: Not on file   Other Topics Concern  . Not on file   Social History Narrative  . No narrative on file      Prior to Admission medications   Medication Sig Start Date End Date Taking? Authorizing Provider  aspirin 81 MG tablet Take 81 mg by mouth daily.  Yes Historical Provider, MD  cetirizine (ZYRTEC) 10 MG tablet Take 10 mg by mouth daily as needed for allergies.   Yes Historical Provider, MD  Multiple Vitamin (MULTIVITAMIN) capsule Take 1 capsule by mouth daily.   Yes Historical Provider, MD  Red Yeast Rice 600 MG CAPS Take 1 capsule by mouth daily.    Yes Historical Provider, MD     Review of systems complete and found to be negative unless listed above in HPI     Physical exam Blood pressure 173/91, pulse 76, height 6' (1.829 m), weight 195 lb (88.451 kg). General: NAD Neck: No JVD, no thyromegaly or thyroid nodule.  Lungs: Clear to auscultation bilaterally with normal respiratory effort. CV: Nondisplaced PMI. Regular rate and rhythm, normal S1/S2, no  S3/S4, no murmur.  No peripheral edema.  No carotid bruit.  Normal pedal pulses.  Abdomen: Soft, nontender, no hepatosplenomegaly, no distention.  Skin: Intact without lesions or rashes.  Neurologic: Alert and oriented x 3.  Psych: Normal affect. Extremities: No clubbing or cyanosis.  HEENT: Normal.   Labs:   No results found for this basename: WBC, HGB, HCT, MCV, PLT   No results found for this basename: NA, K, CL, CO2, BUN, CREATININE, CALCIUM, LABALBU, PROT, BILITOT, ALKPHOS, ALT, AST, GLUCOSE,  in the last 168 hours No results found for this basename: CKTOTAL, CKMB, CKMBINDEX, TROPONINI    Lab Results  Component Value Date   CHOL 188 10/08/2013   Lab Results  Component Value Date   HDL 55 10/08/2013   Lab Results  Component Value Date   LDLCALC 109* 10/08/2013   Lab Results  Component Value Date   TRIG 121 10/08/2013   Lab Results  Component Value Date   CHOLHDL 3.4 10/08/2013   No results found for this basename: LDLDIRECT         Studies: No results found.  ASSESSMENT AND PLAN:  1. Chest pain: Although he apparently lacks traditional risk factors, his symptoms appear to be consistent with stable angina. I will prescribe sublingual nitroglycerin as he plans to go to the beach this weekend with his wife. I will proceed with an echocardiogram to evaluate left ventricular systolic function and regional wall motion. I will also obtain an exercise Cardiolite myocardial perfusion study to evaluate for ischemia. As he plans to go on a cruise in one month, I will try and complete this assessment beforehand.  2. Elevated BP: I have asked him to return next week for a nurse visit so that he can have a manual blood pressure check. If his blood pressure is consistently elevated, he would benefit from antihypertensive therapy.  Dispo: f/u 3 weeks.  Signed: Kate Sable, M.D., F.A.C.C.  07/05/2014, 1:08 PM

## 2014-07-05 NOTE — Patient Instructions (Signed)
   Begin Nitroglycerin as needed for chest pain ONLY. Sent to pharmacy.  Continue all other medications.   Your physician has requested that you have an echocardiogram. Echocardiography is a painless test that uses sound waves to create images of your heart. It provides your doctor with information about the size and shape of your heart and how well your heart's chambers and valves are working. This procedure takes approximately one hour. There are no restrictions for this procedure. Your physician has requested that you have en exercise stress myoview. For further information please visit [email or web address]. Please follow instruction sheet, as given. Office will contact with results via phone or letter.   Nurse visit in one week. ([employer]) Follow up with Dr. Bronson Ing in  3-4 weeks

## 2014-07-12 ENCOUNTER — Other Ambulatory Visit: Payer: Self-pay | Admitting: *Deleted

## 2014-07-12 ENCOUNTER — Other Ambulatory Visit: Payer: Self-pay | Admitting: Cardiovascular Disease

## 2014-07-12 DIAGNOSIS — R079 Chest pain, unspecified: Secondary | ICD-10-CM

## 2014-07-13 ENCOUNTER — Encounter (HOSPITAL_COMMUNITY): Payer: Self-pay

## 2014-07-13 ENCOUNTER — Ambulatory Visit (HOSPITAL_COMMUNITY)
Admission: RE | Admit: 2014-07-13 | Discharge: 2014-07-13 | Disposition: A | Discharge status: Home / Self Care | Payer: Medicare Other | Source: Ambulatory Visit | Attending: Cardiovascular Disease | Admitting: Cardiovascular Disease

## 2014-07-13 ENCOUNTER — Encounter (HOSPITAL_COMMUNITY)
Admission: RE | Admit: 2014-07-13 | Discharge: 2014-07-13 | Disposition: A | Discharge status: Home / Self Care | Payer: Medicare Other | Source: Ambulatory Visit | Attending: Cardiovascular Disease | Admitting: Cardiovascular Disease

## 2014-07-13 ENCOUNTER — Ambulatory Visit (INDEPENDENT_AMBULATORY_CARE_PROVIDER_SITE_OTHER): Payer: Medicare Other | Admitting: *Deleted

## 2014-07-13 ENCOUNTER — Other Ambulatory Visit: Payer: Self-pay | Admitting: Adult Health

## 2014-07-13 VITALS — BP 152/84 | HR 67 | Ht 72.0 in | Wt 191.0 lb

## 2014-07-13 DIAGNOSIS — I517 Cardiomegaly: Secondary | ICD-10-CM | POA: Diagnosis not present

## 2014-07-13 DIAGNOSIS — I10 Essential (primary) hypertension: Secondary | ICD-10-CM | POA: Diagnosis not present

## 2014-07-13 DIAGNOSIS — I080 Rheumatic disorders of both mitral and aortic valves: Secondary | ICD-10-CM | POA: Diagnosis not present

## 2014-07-13 DIAGNOSIS — I209 Angina pectoris, unspecified: Secondary | ICD-10-CM | POA: Insufficient documentation

## 2014-07-13 DIAGNOSIS — I2510 Atherosclerotic heart disease of native coronary artery without angina pectoris: Secondary | ICD-10-CM | POA: Diagnosis not present

## 2014-07-13 DIAGNOSIS — R079 Chest pain, unspecified: Secondary | ICD-10-CM | POA: Insufficient documentation

## 2014-07-13 DIAGNOSIS — E785 Hyperlipidemia, unspecified: Secondary | ICD-10-CM | POA: Insufficient documentation

## 2014-07-13 DIAGNOSIS — I079 Rheumatic tricuspid valve disease, unspecified: Secondary | ICD-10-CM | POA: Diagnosis not present

## 2014-07-13 DIAGNOSIS — Z7982 Long term (current) use of aspirin: Secondary | ICD-10-CM | POA: Diagnosis not present

## 2014-07-13 DIAGNOSIS — R9439 Abnormal result of other cardiovascular function study: Secondary | ICD-10-CM | POA: Diagnosis present

## 2014-07-13 DIAGNOSIS — R0789 Other chest pain: Secondary | ICD-10-CM

## 2014-07-13 DIAGNOSIS — Z136 Encounter for screening for cardiovascular disorders: Secondary | ICD-10-CM

## 2014-07-13 DIAGNOSIS — Z0130 Encounter for examination of blood pressure without abnormal findings: Secondary | ICD-10-CM

## 2014-07-13 MED ORDER — SODIUM CHLORIDE 0.9 % IJ SOLN
10.0000 mL | INTRAMUSCULAR | Status: DC | PRN
  Administered 2014-07-13: 10 mL via INTRAVENOUS

## 2014-07-13 MED ORDER — TECHNETIUM TC 99M SESTAMIBI - CARDIOLITE
30.0000 | Freq: Once | INTRAVENOUS | Status: AC | PRN
  Administered 2014-07-13: 12:00:00 30 via INTRAVENOUS

## 2014-07-13 MED ORDER — TECHNETIUM TC 99M SESTAMIBI - CARDIOLITE
10.0000 | Freq: Once | INTRAVENOUS | Status: AC | PRN
  Administered 2014-07-13: 10 via INTRAVENOUS

## 2014-07-13 MED ORDER — SODIUM CHLORIDE 0.9 % IJ SOLN
INTRAMUSCULAR | Status: AC
  Administered 2014-07-13: 10 mL via INTRAVENOUS
  Filled 2014-07-13: qty 10

## 2014-07-13 MED ORDER — TECHNETIUM TC 99M SESTAMIBI GENERIC - CARDIOLITE: 30.0000 | Freq: Once | INTRAVENOUS | Status: DC | PRN

## 2014-07-13 MED ORDER — SODIUM CHLORIDE 0.9 % IJ SOLN: 10.0000 mL | INTRAMUSCULAR | Status: DC | PRN

## 2014-07-13 NOTE — Progress Notes (Signed)
Stress Lab Nurses Notes - [place] 07/13/2014 Reason for doing test: Chest Pain Type of test: Stress Cardiolite Nurse performing test: [name], RN Nuclear Medicine Tech: Redmond Baseman Echo Tech: Not Applicable MD performing test: S. McDowell/K.Purcell Nails NP  Family MD: Mickie Hillier Test explained and consent signed: Yes.   IV started: Saline lock flushed, No redness or edema and Saline lock started in radiology Symptoms: SOB & Chest tightness Treatment/Intervention: None Reason test stopped: reached target HR After recovery IV was: Discontinued via X-ray tech and No redness or edema Patient to return to Nuc. Med at : 12:30 Patient discharged: Home Patient's Condition upon discharge was: stable Comments: During test peak BP 217/76 & HR 148.  Recovery BP 172/87 & HR 72.  Symptoms resolved in recovery. Geanie Cooley T

## 2014-07-13 NOTE — Progress Notes (Signed)
Pt had no complaints bp was 154/82 HR 62.

## 2014-07-13 NOTE — Patient Instructions (Signed)
Please keep a Blood pressure log.   We will contact you regarding stress test  You will see Dr. Bronson Ing on the 8th of Spetember at 8:40am in [place]  Thank you for choosing [facility]!!

## 2014-07-13 NOTE — Progress Notes (Signed)
*  PRELIMINARY RESULTS* Echocardiogram 2D Echocardiogram has been performed.  Roger [name] 07/13/2014, 2:56 PM

## 2014-07-14 ENCOUNTER — Ambulatory Visit (INDEPENDENT_AMBULATORY_CARE_PROVIDER_SITE_OTHER): Payer: Medicare Other | Admitting: Cardiovascular Disease

## 2014-07-14 ENCOUNTER — Telehealth: Payer: Self-pay | Admitting: Family Medicine

## 2014-07-14 ENCOUNTER — Other Ambulatory Visit: Payer: Self-pay | Admitting: Cardiovascular Disease

## 2014-07-14 ENCOUNTER — Encounter: Payer: Self-pay | Admitting: *Deleted

## 2014-07-14 ENCOUNTER — Ambulatory Visit (HOSPITAL_COMMUNITY)
Admission: RE | Admit: 2014-07-14 | Discharge: 2014-07-14 | Disposition: A | Discharge status: Home / Self Care | Payer: Medicare Other | Source: Ambulatory Visit | Attending: Cardiovascular Disease | Admitting: Cardiovascular Disease

## 2014-07-14 ENCOUNTER — Telehealth: Payer: Self-pay | Admitting: *Deleted

## 2014-07-14 ENCOUNTER — Encounter: Payer: Self-pay | Admitting: Cardiovascular Disease

## 2014-07-14 VITALS — BP 176/84 | HR 60 | Ht 72.0 in | Wt 195.0 lb

## 2014-07-14 DIAGNOSIS — R0602 Shortness of breath: Secondary | ICD-10-CM

## 2014-07-14 DIAGNOSIS — R079 Chest pain, unspecified: Secondary | ICD-10-CM

## 2014-07-14 DIAGNOSIS — I209 Angina pectoris, unspecified: Secondary | ICD-10-CM

## 2014-07-14 DIAGNOSIS — R931 Abnormal findings on diagnostic imaging of heart and coronary circulation: Secondary | ICD-10-CM

## 2014-07-14 DIAGNOSIS — IMO0001 Reserved for inherently not codable concepts without codable children: Secondary | ICD-10-CM

## 2014-07-14 DIAGNOSIS — R030 Elevated blood-pressure reading, without diagnosis of hypertension: Secondary | ICD-10-CM

## 2014-07-14 DIAGNOSIS — R9439 Abnormal result of other cardiovascular function study: Secondary | ICD-10-CM

## 2014-07-14 MED ORDER — METOPROLOL TARTRATE 25 MG PO TABS: 12.5000 mg | ORAL_TABLET | Freq: Two times a day (BID) | ORAL | Status: DC

## 2014-07-14 NOTE — Telephone Encounter (Signed)
Message copied by Desma Mcgregor on Wed Jul 14, 2014  4:39 PM ------      Message from: Kate Sable A      Created: Wed Jul 14, 2014  3:07 PM       Ok. ------

## 2014-07-14 NOTE — Progress Notes (Signed)
Patient ID: Roger [name], male   DOB: [birth year], 68 y.o.   MRN: [id number]      SUBJECTIVE: The patient returns for follow up after undergoing cardiovascular testing performed for the evaluation of chest pain. Echocardiography demonstrated normal LV systolic function and regional wall motion with mild LVH and grade 1 diastolic dysfunction. Nuclear MPI was markedly abnormal with marked ST depression, TID, inferior scar and focal lateral apical ischemia and was deemed a high risk study, with a Duke treadmill score of -18. He has had no further episodes of chest pain while at home. During the stress test, he complained of chest tightness, shortness of breath, and fatigue.   Review of Systems: As per "subjective", otherwise negative.  No Known Allergies  Current Outpatient Prescriptions  Medication Sig Dispense Refill  . aspirin 81 MG tablet Take 81 mg by mouth daily.      . cetirizine (ZYRTEC) 10 MG tablet Take 10 mg by mouth daily as needed for allergies.      . Multiple Vitamin (MULTIVITAMIN) capsule Take 1 capsule by mouth daily.      . nitroGLYCERIN (NITROSTAT) 0.4 MG SL tablet Place 1 tablet (0.4 mg total) under the tongue every 5 (five) minutes as needed for chest pain.  25 tablet  3  . Red Yeast Rice 600 MG CAPS Take 1 capsule by mouth daily.        No current facility-administered medications for this visit.    Past Medical History  Diagnosis Date  . Adenomatous polyps   . Seasonal allergies   . Impaired fasting glucose     Past Surgical History  Procedure Laterality Date  . Colonoscopy  10/03/05    inflamed hyperplastic polyp/adenomatous poylp, left sided diverticula  . Knee surgery      both  . Colonoscopy  03/03/2012    Procedure: COLONOSCOPY;  Surgeon: Daneil Dolin, MD;  Location: [facility];  Service: Endoscopy;  Laterality: N/A;  10:15    History   Social History  . Marital Status: Married    Spouse Name: N/A    Number of Children: N/A  . Years of  Education: N/A   Occupational History  . Not on file.   Social History Main Topics  . Smoking status: Never Smoker   . Smokeless tobacco: Never Used  . Alcohol Use: Yes     Comment: 2 glass of wine a week  . Drug Use: No  . Sexual Activity: Not on file   Other Topics Concern  . Not on file   Social History Narrative  . No narrative on file    BP 176/84  Pulse 60    PHYSICAL EXAM General: NAD HEENT: Normal. Neck: No JVD, no thyromegaly. Lungs: Clear to auscultation bilaterally with normal respiratory effort. CV: Nondisplaced PMI.  Regular rate and rhythm, normal S1/S2, no S3/S4, no murmur. No pretibial or periankle edema.  No carotid bruit.  Normal pedal pulses.  Abdomen: Soft, nontender, no hepatosplenomegaly, no distention.  Neurologic: Alert and oriented x 3.  Psych: Normal affect. Skin: Normal. Musculoskeletal: Normal range of motion, no gross deformities. Extremities: No clubbing or cyanosis.   ECG: Most recent ECG reviewed.  Stress test: High risk abnormal exercise Cardiolite as outlined. There were  significant ST segment abnormalities consistent with ischemia  beginning in stage I and associated with non limiting chest pain and  hypertensive response. Duke treadmill score was high risk at -18.  Perfusion imaging is of variable quality from stress  to rest  imaging, indicates potentially inferior scar with mild focal lateral  apical ischemia, although the increased TID ratio 1.34 is more  concerning for potential balanced global ischemia which might be  seen with multivessel distribution CAD. LVEF is calculated at 54%  with normal volumes and no obvious wall motion abnormalities.    ASSESSMENT AND PLAN: 1. Chest pain in the setting of a high risk stress test: Given the marked abnormalities noted with exercise Cardiolite myocardial perfusion study (marked ST depression, TID, inferior scar and focal lateral apical ischemia), I will proceed with coronary  angiography. His resting HR is 60 bpm today. I will add low-dose metoprolol 12.5 mg bid and continue ASA 81 mg daily and SL nitro prn. Continue red rice yeast extract for now. I had a long discussion with both the patient and his wife and explained the risks/benefits of coronary angiography and PCI, and they were very appreciative.  2. Elevated BP: The patient was very anxious today. I will continue to monitor. If his blood pressure is consistently elevated, he would benefit from antihypertensive therapy.  3. Hyperlipidemia: Will check lipids and LFT's. Continue red rice yeast extract for now, but will likely require moderate intensity statin therapy at a minimum. LDL 109 in 09/2013.   Dispo: f/u after cath.  Time spent: 40 minutes, of which >50% spent on counseling regarding CAD with pt and wife and reviewing stress test results.  Kate Sable, M.D., F.A.C.C.

## 2014-07-14 NOTE — Telephone Encounter (Signed)
pts going to have a heart Cath put in on Friday morning 07/16/14 They would like for you to call him an discuss this if you have the time  To do so.

## 2014-07-14 NOTE — Patient Instructions (Signed)
Your physician recommends that you schedule a follow-up appointment in: 1 month with Dr. Bronson Ing  Your physician has requested that you have a cardiac catheterization. Cardiac catheterization is used to diagnose and/or treat various heart conditions. Doctors may recommend this procedure for a number of different reasons. The most common reason is to evaluate chest pain. Chest pain can be a symptom of coronary artery disease (CAD), and cardiac catheterization can show whether plaque is narrowing or blocking your heart's arteries. This procedure is also used to evaluate the valves, as well as measure the blood flow and oxygen levels in different parts of your heart. For further information please visit [email or web address]. Please follow instruction sheet, as given.   Your physician has recommended you make the following change in your medication:   Start Metoprolol 12.5 mg twice daily  Thank you for choosing [facility]!!

## 2014-07-14 NOTE — Addendum Note (Signed)
Addended by: Hilarie Fredrickson T on: 07/14/2014 12:38 PM   Modules accepted: Orders

## 2014-07-14 NOTE — Telephone Encounter (Signed)
Notified pt. 

## 2014-07-15 ENCOUNTER — Encounter (HOSPITAL_COMMUNITY): Payer: Self-pay | Admitting: Pharmacy Technician

## 2014-07-15 ENCOUNTER — Telehealth: Payer: Self-pay | Admitting: *Deleted

## 2014-07-15 DIAGNOSIS — R079 Chest pain, unspecified: Secondary | ICD-10-CM | POA: Diagnosis not present

## 2014-07-15 LAB — CBC WITH DIFFERENTIAL/PLATELET
BASOS ABS: 0.1 10*3/uL (ref 0.0–0.1)
BASOS PCT: 1 % (ref 0–1)
EOS ABS: 0.3 10*3/uL (ref 0.0–0.7)
Eosinophils Relative: 5 % (ref 0–5)
HCT: 45 % (ref 39.0–52.0)
Hemoglobin: 15.5 g/dL (ref 13.0–17.0)
Lymphocytes Relative: 29 % (ref 12–46)
Lymphs Abs: 1.6 10*3/uL (ref 0.7–4.0)
MCH: 29.8 pg (ref 26.0–34.0)
MCHC: 34.4 g/dL (ref 30.0–36.0)
MCV: 86.5 fL (ref 78.0–100.0)
MONOS PCT: 10 % (ref 3–12)
Monocytes Absolute: 0.5 10*3/uL (ref 0.1–1.0)
NEUTROS ABS: 3 10*3/uL (ref 1.7–7.7)
NEUTROS PCT: 55 % (ref 43–77)
PLATELETS: 203 10*3/uL (ref 150–400)
RBC: 5.2 MIL/uL (ref 4.22–5.81)
RDW: 13.8 % (ref 11.5–15.5)
WBC: 5.4 10*3/uL (ref 4.0–10.5)

## 2014-07-15 LAB — BASIC METABOLIC PANEL
BUN: 18 mg/dL (ref 6–23)
CALCIUM: 9.1 mg/dL (ref 8.4–10.5)
CO2: 30 mEq/L (ref 19–32)
Chloride: 105 mEq/L (ref 96–112)
Creat: 1.1 mg/dL (ref 0.50–1.35)
GLUCOSE: 100 mg/dL — AB (ref 70–99)
POTASSIUM: 4.3 meq/L (ref 3.5–5.3)
SODIUM: 141 meq/L (ref 135–145)

## 2014-07-15 LAB — HEPATIC FUNCTION PANEL
ALBUMIN: 4.3 g/dL (ref 3.5–5.2)
ALT: 22 U/L (ref 0–53)
AST: 27 U/L (ref 0–37)
Alkaline Phosphatase: 72 U/L (ref 39–117)
Bilirubin, Direct: 0.1 mg/dL (ref 0.0–0.3)
Indirect Bilirubin: 0.5 mg/dL (ref 0.2–1.2)
Total Bilirubin: 0.6 mg/dL (ref 0.2–1.2)
Total Protein: 6.9 g/dL (ref 6.0–8.3)

## 2014-07-15 LAB — LIPID PANEL
CHOL/HDL RATIO: 3.7 ratio
CHOLESTEROL: 190 mg/dL (ref 0–200)
HDL: 51 mg/dL (ref >39)
LDL Cholesterol: 116 mg/dL — ABNORMAL HIGH (ref 0–99)
TRIGLYCERIDES: 114 mg/dL (ref <150)
VLDL: 23 mg/dL (ref 0–40)

## 2014-07-15 LAB — PROTIME-INR
INR: 0.98 (ref <1.50)
PROTHROMBIN TIME: 13 s (ref 11.6–15.2)

## 2014-07-15 NOTE — Telephone Encounter (Signed)
Message copied by Desma Mcgregor on Thu Jul 15, 2014  4:24 PM ------      Message from: Kate Sable A      Created: Thu Jul 15, 2014  4:00 PM       Scheduled for cath. Will likely receive statin therapy at discharge. If not, I will prescribe. ------

## 2014-07-15 NOTE — Telephone Encounter (Signed)
Pt.notified

## 2014-07-15 NOTE — Telephone Encounter (Signed)
Patient said to call him at [phone]. He will be available at 12:30 pm.

## 2014-07-16 ENCOUNTER — Encounter (HOSPITAL_COMMUNITY): Payer: Self-pay | Admitting: General Practice

## 2014-07-16 ENCOUNTER — Encounter (HOSPITAL_COMMUNITY)
Admission: RE | Disposition: A | Discharge status: Home / Self Care | Payer: Self-pay | Source: Ambulatory Visit | Attending: Interventional Cardiology

## 2014-07-16 ENCOUNTER — Ambulatory Visit (HOSPITAL_COMMUNITY)
Admission: RE | Admit: 2014-07-16 | Discharge: 2014-07-17 | Disposition: A | Discharge status: Home / Self Care | Payer: Medicare Other | Source: Ambulatory Visit | Attending: Interventional Cardiology | Admitting: Interventional Cardiology

## 2014-07-16 DIAGNOSIS — I10 Essential (primary) hypertension: Secondary | ICD-10-CM

## 2014-07-16 DIAGNOSIS — E785 Hyperlipidemia, unspecified: Secondary | ICD-10-CM

## 2014-07-16 DIAGNOSIS — I2510 Atherosclerotic heart disease of native coronary artery without angina pectoris: Secondary | ICD-10-CM | POA: Diagnosis not present

## 2014-07-16 DIAGNOSIS — R9439 Abnormal result of other cardiovascular function study: Secondary | ICD-10-CM

## 2014-07-16 DIAGNOSIS — I209 Angina pectoris, unspecified: Secondary | ICD-10-CM | POA: Diagnosis present

## 2014-07-16 DIAGNOSIS — Z713 Dietary counseling and surveillance: Secondary | ICD-10-CM

## 2014-07-16 DIAGNOSIS — Z7182 Exercise counseling: Secondary | ICD-10-CM

## 2014-07-16 DIAGNOSIS — Z7982 Long term (current) use of aspirin: Secondary | ICD-10-CM | POA: Insufficient documentation

## 2014-07-16 DIAGNOSIS — Z955 Presence of coronary angioplasty implant and graft: Secondary | ICD-10-CM

## 2014-07-16 DIAGNOSIS — R931 Abnormal findings on diagnostic imaging of heart and coronary circulation: Secondary | ICD-10-CM

## 2014-07-16 HISTORY — PX: CORONARY ANGIOPLASTY WITH STENT PLACEMENT: SHX49

## 2014-07-16 HISTORY — PX: LEFT AND RIGHT HEART CATHETERIZATION WITH CORONARY ANGIOGRAM: SHX5449

## 2014-07-16 HISTORY — DX: Essential (primary) hypertension: I10

## 2014-07-16 LAB — POCT ACTIVATED CLOTTING TIME: Activated Clotting Time: 298 seconds

## 2014-07-16 LAB — PLATELET COUNT: Platelets: 177 10*3/uL (ref 150–400)

## 2014-07-16 SURGERY — LEFT AND RIGHT HEART CATHETERIZATION WITH CORONARY ANGIOGRAM
Anesthesia: LOCAL

## 2014-07-16 MED ORDER — FENTANYL CITRATE 0.05 MG/ML IJ SOLN
INTRAMUSCULAR | Status: AC
  Filled 2014-07-16: qty 2

## 2014-07-16 MED ORDER — CLOPIDOGREL BISULFATE 300 MG PO TABS
ORAL_TABLET | ORAL | Status: AC
  Filled 2014-07-16: qty 2

## 2014-07-16 MED ORDER — HEPARIN SODIUM (PORCINE) 1000 UNIT/ML IJ SOLN
INTRAMUSCULAR | Status: AC
  Filled 2014-07-16: qty 1

## 2014-07-16 MED ORDER — HEPARIN (PORCINE) IN NACL 2-0.9 UNIT/ML-% IJ SOLN
INTRAMUSCULAR | Status: AC
  Filled 2014-07-16: qty 1000

## 2014-07-16 MED ORDER — ACETAMINOPHEN 500 MG PO TABS: 1000.0000 mg | ORAL_TABLET | Freq: Four times a day (QID) | ORAL | Status: DC | PRN

## 2014-07-16 MED ORDER — SODIUM CHLORIDE 0.9 % IV SOLN: 250.0000 mL | INTRAVENOUS | Status: DC | PRN

## 2014-07-16 MED ORDER — ASPIRIN 81 MG PO TABS: 81.0000 mg | ORAL_TABLET | Freq: Every day | ORAL | Status: DC

## 2014-07-16 MED ORDER — ONDANSETRON HCL 4 MG/2ML IJ SOLN: 4.0000 mg | Freq: Four times a day (QID) | INTRAMUSCULAR | Status: DC | PRN

## 2014-07-16 MED ORDER — VERAPAMIL HCL 2.5 MG/ML IV SOLN
INTRAVENOUS | Status: AC
  Filled 2014-07-16: qty 2

## 2014-07-16 MED ORDER — ACETAMINOPHEN 325 MG PO TABS
650.0000 mg | ORAL_TABLET | ORAL | Status: DC | PRN
  Administered 2014-07-16: 23:00:00 650 mg via ORAL
  Filled 2014-07-16: qty 2

## 2014-07-16 MED ORDER — ASPIRIN 81 MG PO CHEW
CHEWABLE_TABLET | ORAL | Status: AC
  Administered 2014-07-16: 81 mg via ORAL
  Filled 2014-07-16: qty 1

## 2014-07-16 MED ORDER — LORATADINE 10 MG PO TABS
10.0000 mg | ORAL_TABLET | Freq: Every day | ORAL | Status: DC
  Administered 2014-07-17: 09:00:00 10 mg via ORAL
  Filled 2014-07-16 (×2): qty 1

## 2014-07-16 MED ORDER — ASPIRIN 81 MG PO CHEW
81.0000 mg | CHEWABLE_TABLET | ORAL | Status: AC
  Administered 2014-07-16: 81 mg via ORAL

## 2014-07-16 MED ORDER — SODIUM CHLORIDE 0.9 % IJ SOLN: 3.0000 mL | Freq: Two times a day (BID) | INTRAMUSCULAR | Status: DC

## 2014-07-16 MED ORDER — CLOPIDOGREL BISULFATE 75 MG PO TABS
75.0000 mg | ORAL_TABLET | Freq: Every day | ORAL | Status: DC
  Administered 2014-07-17: 75 mg via ORAL
  Filled 2014-07-16: qty 1

## 2014-07-16 MED ORDER — NITROGLYCERIN 0.4 MG SL SUBL: 0.4000 mg | SUBLINGUAL_TABLET | SUBLINGUAL | Status: DC | PRN

## 2014-07-16 MED ORDER — SODIUM CHLORIDE 0.9 % IJ SOLN: 3.0000 mL | INTRAMUSCULAR | Status: DC | PRN

## 2014-07-16 MED ORDER — TIROFIBAN HCL IV 5 MG/100ML
0.1500 ug/kg/min | INTRAVENOUS | Status: AC
  Administered 2014-07-16: 0.15 ug/kg/min via INTRAVENOUS
  Filled 2014-07-16: qty 100

## 2014-07-16 MED ORDER — METOPROLOL TARTRATE 12.5 MG HALF TABLET
12.5000 mg | ORAL_TABLET | Freq: Two times a day (BID) | ORAL | Status: DC
Start: 2014-07-16 — End: 2014-07-17
  Administered 2014-07-16 – 2014-07-17 (×2): 12.5 mg via ORAL
  Filled 2014-07-16 (×3): qty 1

## 2014-07-16 MED ORDER — NITROGLYCERIN 1 MG/10 ML FOR IR/CATH LAB
INTRA_ARTERIAL | Status: AC
  Filled 2014-07-16: qty 10

## 2014-07-16 MED ORDER — TIROFIBAN HCL IV 5 MG/100ML
INTRAVENOUS | Status: AC
  Filled 2014-07-16: qty 100

## 2014-07-16 MED ORDER — MIDAZOLAM HCL 2 MG/2ML IJ SOLN
INTRAMUSCULAR | Status: AC
  Filled 2014-07-16: qty 2

## 2014-07-16 MED ORDER — SODIUM CHLORIDE 0.9 % IV SOLN
1.0000 mL/kg/h | INTRAVENOUS | Status: AC
  Administered 2014-07-16 (×2): 1 mL/kg/h via INTRAVENOUS

## 2014-07-16 MED ORDER — ASPIRIN 81 MG PO CHEW
81.0000 mg | CHEWABLE_TABLET | Freq: Every day | ORAL | Status: DC
  Administered 2014-07-17: 09:00:00 81 mg via ORAL
  Filled 2014-07-16: qty 1

## 2014-07-16 MED ORDER — MORPHINE SULFATE 2 MG/ML IJ SOLN: 2.0000 mg | INTRAMUSCULAR | Status: DC | PRN

## 2014-07-16 MED ORDER — LIDOCAINE HCL (PF) 1 % IJ SOLN
INTRAMUSCULAR | Status: AC
  Filled 2014-07-16: qty 30

## 2014-07-16 MED ORDER — ATORVASTATIN CALCIUM 10 MG PO TABS
10.0000 mg | ORAL_TABLET | Freq: Every day | ORAL | Status: DC
  Administered 2014-07-16: 21:00:00 10 mg via ORAL
  Filled 2014-07-16 (×2): qty 1

## 2014-07-16 NOTE — Interval H&P Note (Signed)
Cath Lab Visit (complete for each Cath Lab visit)  Clinical Evaluation Leading to the Procedure:   ACS: No.  Non-ACS:    Anginal Classification: CCS III  Anti-ischemic medical therapy: No Therapy  Non-Invasive Test Results: High-risk stress test findings: cardiac mortality >3%/year  Prior CABG: No previous CABG      History and Physical Interval Note:  07/16/2014 10:45 AM  Roger [name]  has presented today for surgery, with the diagnosis of angina  The various methods of treatment have been discussed with the patient and family. After consideration of risks, benefits and other options for treatment, the patient has consented to  Procedure(s): LEFT AND RIGHT HEART CATHETERIZATION WITH CORONARY ANGIOGRAM (N/A) as a surgical intervention .  The patient's history has been reviewed, patient examined, no change in status, stable for surgery.  I have reviewed the patient's chart and labs.  Questions were answered to the patient's satisfaction.     [name] S.

## 2014-07-16 NOTE — Care Management Note (Addendum)
  Page 1 of 1   07/16/2014     1:25:45 PM CARE MANAGEMENT NOTE 07/16/2014  Patient:  Roger [name], Roger [name]   Account Number:  [id number]  Date Initiated:  07/16/2014  Documentation initiated by:  Mariann Laster  Subjective/Objective Assessment:   Angina     Action/Plan:   CM to follow for disposition needs   Anticipated DC Date:  07/17/2014   Anticipated DC Plan:  HOME/SELF CARE         Choice offered to / List presented to:             Status of service:  Completed, signed off Medicare Important Message given?   (If response is "NO", the following Medicare IM given date fields will be blank) Date Medicare IM given:   Medicare IM given by:   Date Additional Medicare IM given:   Additional Medicare IM given by:    Discharge Disposition:  HOME/SELF CARE  Per UR Regulation:    If discussed at Long Length of Stay Meetings, dates discussed:    Comments:  Lekita Kerekes RN, BSN, MSHL, CCM  Nurse - Case Manager, (Unit [phone]  07/16/2014 Med Review:  Plavix Dispo Plan:  Home / Self care.

## 2014-07-16 NOTE — CV Procedure (Signed)
       PROCEDURE:  Left heart catheterization with selective coronary angiography, left ventriculogram.  PCI LAD  INDICATIONS:  High risk stress test  The risks, benefits, and details of the procedure were explained to the patient.  The patient verbalized understanding and wanted to proceed.  Informed written consent was obtained.  PROCEDURE TECHNIQUE:  After Xylocaine anesthesia a 61F slender sheath was placed in the right radial artery with a single anterior needle wall stick.   IV heparin was given. Right coronary angiography was done using a Judkins R4 guide catheter.  Left coronary angiography was done using a Judkins L3.5 guide catheter.  Left ventriculography was done using a pigtail catheter.  A TR band was used for hemostasis.   CONTRAST:  Total of 80 cc.  COMPLICATIONS:  None.    HEMODYNAMICS:  Aortic pressure was 152/73; LV pressure was 148/8; LVEDP 18.  There was no gradient between the left ventricle and aorta.    ANGIOGRAPHIC DATA:   The left main coronary artery is widely patent.  The left anterior descending artery is a large vessel which wraps around the apex. At the origin of the first diagonal, there is a severe, 90% stenosis. There is mild disease past the stenosis. The mid to distal LAD has mild, diffuse atherosclerosis. There are several small to medium-sized diagonals which are patent.  The left circumflex artery is a large, dominant vessel. There are 2 large obtuse marginal vessels which are patent. The ramus vessel is also widely patent.    The right coronary artery is a medium size nondominant vessel which is widely patent.  LEFT VENTRICULOGRAM:  Left ventricular angiogram was not done. LVEDP was 18 mmHg.  PCI NARRATIVE: A CLS 3.0 guiding catheters using his left main. A pro-water wire was placed across the area disease in the LAD. A 2.5 x 15 angiosculpt cutting balloon was placed across the lesion in several inflations were performed. A 3.0 x 24 promise  drug-eluting stent was then used to treat the entire diseased segment. A large diagonal was jailed. The stent was post dilated with a 3.75 x 15 noncompliant balloon. TIMI-3 flow remained in as well as in the large diagonal. There was some plaque shift into the diagonal despite using the scoring balloon.  Several doses of intra-coronary nitroglycerin were given. The patient tolerated the procedure well.  IMPRESSIONS:  1. Normal left main coronary artery. 2. Severe disease in the proximal to mid left anterior descending artery.  This was successfully treated with a 3.0 x 24 promus drug-eluting stent, postdilated to 3.8 mm in diameter. There was a large first diagonal which was jailed but continued to have TIMI-3 flow. 3. Widely patent left circumflex artery and its branches. 4. Widely patent nondominant right coronary artery. 5. Normal left ventricular systolic function.  LVEDP 18 mmHg.  Ejection fraction normal by prior echocardiogram.  RECOMMENDATION:  Continue dual antiplatelet therapy for at least a year. He'll be watched overnight. Continue aggressive secondary prevention. Would recommend cardiac rehabilitation as well. Anticipate discharge tomorrow. Continue IV tirofiban for 4 hours.  Will start low dose statin as well given his CAD.

## 2014-07-16 NOTE — H&P (View-Only) (Signed)
Patient ID: Roger [name], male   DOB: 10-02-1946, 68 y.o.   MRN: [id number]      SUBJECTIVE: The patient returns for follow up after undergoing cardiovascular testing performed for the evaluation of chest pain. Echocardiography demonstrated normal LV systolic function and regional wall motion with mild LVH and grade 1 diastolic dysfunction. Nuclear MPI was markedly abnormal with marked ST depression, TID, inferior scar and focal lateral apical ischemia and was deemed a high risk study, with a Duke treadmill score of -18. He has had no further episodes of chest pain while at home. During the stress test, he complained of chest tightness, shortness of breath, and fatigue.   Review of Systems: As per "subjective", otherwise negative.  No Known Allergies  Current Outpatient Prescriptions  Medication Sig Dispense Refill  . aspirin 81 MG tablet Take 81 mg by mouth daily.      . cetirizine (ZYRTEC) 10 MG tablet Take 10 mg by mouth daily as needed for allergies.      . Multiple Vitamin (MULTIVITAMIN) capsule Take 1 capsule by mouth daily.      . nitroGLYCERIN (NITROSTAT) 0.4 MG SL tablet Place 1 tablet (0.4 mg total) under the tongue every 5 (five) minutes as needed for chest pain.  25 tablet  3  . Red Yeast Rice 600 MG CAPS Take 1 capsule by mouth daily.        No current facility-administered medications for this visit.    Past Medical History  Diagnosis Date  . Adenomatous polyps   . Seasonal allergies   . Impaired fasting glucose     Past Surgical History  Procedure Laterality Date  . Colonoscopy  10/03/05    inflamed hyperplastic polyp/adenomatous poylp, left sided diverticula  . Knee surgery      both  . Colonoscopy  03/03/2012    Procedure: COLONOSCOPY;  Surgeon: Daneil Dolin, MD;  Location: [facility];  Service: Endoscopy;  Laterality: N/A;  10:15    History   Social History  . Marital Status: Married    Spouse Name: N/A    Number of Children: N/A  . Years of  Education: N/A   Occupational History  . Not on file.   Social History Main Topics  . Smoking status: Never Smoker   . Smokeless tobacco: Never Used  . Alcohol Use: Yes     Comment: 2 glass of wine a week  . Drug Use: No  . Sexual Activity: Not on file   Other Topics Concern  . Not on file   Social History Narrative  . No narrative on file    BP 176/84  Pulse 60    PHYSICAL EXAM General: NAD HEENT: Normal. Neck: No JVD, no thyromegaly. Lungs: Clear to auscultation bilaterally with normal respiratory effort. CV: Nondisplaced PMI.  Regular rate and rhythm, normal S1/S2, no S3/S4, no murmur. No pretibial or periankle edema.  No carotid bruit.  Normal pedal pulses.  Abdomen: Soft, nontender, no hepatosplenomegaly, no distention.  Neurologic: Alert and oriented x 3.  Psych: Normal affect. Skin: Normal. Musculoskeletal: Normal range of motion, no gross deformities. Extremities: No clubbing or cyanosis.   ECG: Most recent ECG reviewed.  Stress test: High risk abnormal exercise Cardiolite as outlined. There were  significant ST segment abnormalities consistent with ischemia  beginning in stage I and associated with non limiting chest pain and  hypertensive response. Duke treadmill score was high risk at -18.  Perfusion imaging is of variable quality from stress  to rest  imaging, indicates potentially inferior scar with mild focal lateral  apical ischemia, although the increased TID ratio 1.34 is more  concerning for potential balanced global ischemia which might be  seen with multivessel distribution CAD. LVEF is calculated at 54%  with normal volumes and no obvious wall motion abnormalities.    ASSESSMENT AND PLAN: 1. Chest pain in the setting of a high risk stress test: Given the marked abnormalities noted with exercise Cardiolite myocardial perfusion study (marked ST depression, TID, inferior scar and focal lateral apical ischemia), I will proceed with coronary  angiography. His resting HR is 60 bpm today. I will add low-dose metoprolol 12.5 mg bid and continue ASA 81 mg daily and SL nitro prn. Continue red rice yeast extract for now. I had a long discussion with both the patient and his wife and explained the risks/benefits of coronary angiography and PCI, and they were very appreciative.  2. Elevated BP: The patient was very anxious today. I will continue to monitor. If his blood pressure is consistently elevated, he would benefit from antihypertensive therapy.  3. Hyperlipidemia: Will check lipids and LFT's. Continue red rice yeast extract for now, but will likely require moderate intensity statin therapy at a minimum. LDL 109 in 09/2013.   Dispo: f/u after cath.  Time spent: 40 minutes, of which >50% spent on counseling regarding CAD with pt and wife and reviewing stress test results.  Kate Sable, M.D., F.A.C.C.

## 2014-07-17 DIAGNOSIS — I2510 Atherosclerotic heart disease of native coronary artery without angina pectoris: Secondary | ICD-10-CM | POA: Diagnosis not present

## 2014-07-17 DIAGNOSIS — I209 Angina pectoris, unspecified: Secondary | ICD-10-CM | POA: Diagnosis not present

## 2014-07-17 DIAGNOSIS — Z713 Dietary counseling and surveillance: Secondary | ICD-10-CM

## 2014-07-17 DIAGNOSIS — I10 Essential (primary) hypertension: Secondary | ICD-10-CM | POA: Diagnosis not present

## 2014-07-17 DIAGNOSIS — E785 Hyperlipidemia, unspecified: Secondary | ICD-10-CM

## 2014-07-17 DIAGNOSIS — Z9861 Coronary angioplasty status: Secondary | ICD-10-CM | POA: Diagnosis not present

## 2014-07-17 DIAGNOSIS — R9439 Abnormal result of other cardiovascular function study: Secondary | ICD-10-CM

## 2014-07-17 DIAGNOSIS — R079 Chest pain, unspecified: Secondary | ICD-10-CM | POA: Diagnosis not present

## 2014-07-17 DIAGNOSIS — Z7182 Exercise counseling: Secondary | ICD-10-CM

## 2014-07-17 LAB — BASIC METABOLIC PANEL
Anion gap: 11 (ref 5–15)
BUN: 15 mg/dL (ref 6–23)
CO2: 26 mEq/L (ref 19–32)
Calcium: 8.8 mg/dL (ref 8.4–10.5)
Chloride: 105 mEq/L (ref 96–112)
Creatinine, Ser: 1.05 mg/dL (ref 0.50–1.35)
GFR, EST AFRICAN AMERICAN: 82 mL/min — AB (ref >90)
GFR, EST NON AFRICAN AMERICAN: 71 mL/min — AB (ref >90)
Glucose, Bld: 104 mg/dL — ABNORMAL HIGH (ref 70–99)
POTASSIUM: 4.1 meq/L (ref 3.7–5.3)
Sodium: 142 mEq/L (ref 137–147)

## 2014-07-17 LAB — CBC
HCT: 42.8 % (ref 39.0–52.0)
Hemoglobin: 14.8 g/dL (ref 13.0–17.0)
MCH: 29.8 pg (ref 26.0–34.0)
MCHC: 34.6 g/dL (ref 30.0–36.0)
MCV: 86.3 fL (ref 78.0–100.0)
Platelets: 177 10*3/uL (ref 150–400)
RBC: 4.96 MIL/uL (ref 4.22–5.81)
RDW: 13 % (ref 11.5–15.5)
WBC: 6.8 10*3/uL (ref 4.0–10.5)

## 2014-07-17 MED ORDER — LISINOPRIL 5 MG PO TABS
5.0000 mg | ORAL_TABLET | Freq: Every day | ORAL | Status: DC
Start: 2014-07-17 — End: 2014-07-17
  Administered 2014-07-17: 5 mg via ORAL
  Filled 2014-07-17 (×2): qty 1

## 2014-07-17 MED ORDER — LISINOPRIL 5 MG PO TABS: 5.0000 mg | ORAL_TABLET | Freq: Every day | ORAL | Status: DC

## 2014-07-17 MED ORDER — LISINOPRIL 5 MG PO TABS
5.0000 mg | ORAL_TABLET | Freq: Once | ORAL | Status: AC
  Administered 2014-07-17: 5 mg via ORAL
  Filled 2014-07-17: qty 1

## 2014-07-17 MED ORDER — LISINOPRIL 10 MG PO TABS: 10.0000 mg | ORAL_TABLET | Freq: Every day | ORAL | Status: DC

## 2014-07-17 MED ORDER — ATORVASTATIN CALCIUM 10 MG PO TABS: 10.0000 mg | ORAL_TABLET | Freq: Every day | ORAL | Status: DC

## 2014-07-17 MED ORDER — CLOPIDOGREL BISULFATE 75 MG PO TABS: 75.0000 mg | ORAL_TABLET | Freq: Every day | ORAL | Status: DC

## 2014-07-17 NOTE — Progress Notes (Signed)
B/p 158/78 mmhg ,cleared for discharge by B. Simon PA, discharged home with wife ,denies any discomforts

## 2014-07-17 NOTE — Progress Notes (Signed)
Additional dose of lisinopril 5 mg given per B. Simon, B/P 173/ 66 mmhg, pt denies any discomforts

## 2014-07-17 NOTE — Progress Notes (Signed)
The patient was seen and examined, and I agree with the assessment and plan as documented above, with modifications as noted below. Pt feeling better after undergoing LAD stenting. Denies chest pain and shortness of breath. Plans on participating in cardiac rehab after cruise with his wife to [place], [place], and [place]. I explained the benefits of both statin therapy and BP reduction. He will continue with Lipitor 10 mg daily. I will also start lisinopril 5 mg daily for BP reduction. Also provided education on lifestyle risk factor modification through specific dietary strategies and exercise (walking). He will f/u with me in clinic on 9/8.

## 2014-07-17 NOTE — Discharge Summary (Signed)
Physician Discharge Summary  Patient ID: Roger [name] MRN: [id number] DOB/AGE: 68/01/1946 68 y.o.  Admit date: 07/16/2014 Discharge date: 07/17/2014  Admission Diagnoses:  Discharge Diagnoses:  Active Problems:   Other and unspecified angina pectoris   Abnormal stress test   HTN (hypertension)   HLD (hyperlipidemia)- LDL not at goal of <70.   CAD- s/p PCI + DES to mid LAD 07/16/14   Discharged Condition: stable  Hospital Course: The patient is a 68 year old male, followed by Dr. Bronson Ing, with a prior history of hypertension and hyperlipidemia who presented to [facility] on 07/16/2014 to undergo elective cardiac catheterization in the setting of a markedly abnormal nuclear stress test. Left heart catheterization was performed by Dr. Irish Lack. Access was obtained via the right radial artery. He was found to have severe disease in the proximal to mid left anterior descending artery. This was successfully treated with PCI utilizing a drug-eluting stent. The left main coronary artery was normal. The left circumflex and right coronary artery are both widely patent. Left ventriculography was deferred as prior 2-D echocardiogram revealed normal systolic function with an ejection fraction of 55-60%. The patient was placed on dual antiplatelet therapy with aspirin plus Plavix and left the Cath Lab in stable condition. He was also continued on beta blocker therapy. Statin therapy was initiated with Lipitor (note LDL was elevated at 116 mg/dL). Due to elevated blood pressure, ACE inhibitor therapy was also initiated. Renal function was stable. He was placed on 5 mg of lisinopril. He had no post-cath complications. He denied recurrent chest pain. The right radial access site remained stable. He had no difficulties with ambulation. He was last seen and examined by Dr. Bronson Ing, who determined that he was stable for discharge home. It is recommended that he enroll in cardiac rehabilitation as an  outpatient. He is scheduled to followup with Dr. Bronson Ing on 07/27/2014 in [place].  Consults: None  Significant Diagnostic Studies:   LHC 07/16/14 IMPRESSIONS:  1. Normal left main coronary artery. 2. Severe disease in the proximal to mid left anterior descending artery. This was successfully treated with a 3.0 x 24 promus drug-eluting stent, postdilated to 3.8 mm in diameter. There was a large first diagonal which was jailed but continued to have TIMI-3 flow. 3. Widely patent left circumflex artery and its branches. 4. Widely patent nondominant right coronary artery. 5. Normal left ventricular systolic function. LVEDP 18 mmHg. Ejection fraction normal by prior echocardiogram.   Treatments: See Hospital Course  Discharge Exam: Blood pressure 161/71, pulse 75, temperature 97.8 F (36.6 C), temperature source Oral, resp. rate 18, height 6' (1.829 m), weight 190 lb 14.7 oz (86.6 kg), SpO2 96.00%.   Disposition: 01-Home or Self Care      Discharge Instructions   Amb Referral to Cardiac Rehabilitation    Complete by:  As directed      Diet - low sodium heart healthy    Complete by:  As directed      Increase activity slowly    Complete by:  As directed             Medication List    STOP taking these medications       Red Yeast Rice 600 MG Caps      TAKE these medications       acetaminophen 500 MG tablet  Commonly known as:  TYLENOL  Take 1,000 mg by mouth every 6 (six) hours as needed (pain).     aspirin 81 MG tablet  Take 81 mg by mouth daily.     atorvastatin 10 MG tablet  Commonly known as:  LIPITOR  Take 1 tablet (10 mg total) by mouth daily at 6 PM.     cetirizine 10 MG tablet  Commonly known as:  ZYRTEC  Take 10 mg by mouth daily as needed for allergies.     clopidogrel 75 MG tablet  Commonly known as:  PLAVIX  Take 1 tablet (75 mg total) by mouth daily with breakfast.     lisinopril 5 MG tablet  Commonly known as:  PRINIVIL,ZESTRIL  Take 1 tablet  (5 mg total) by mouth daily.     metoprolol tartrate 25 MG tablet  Commonly known as:  LOPRESSOR  Take 0.5 tablets (12.5 mg total) by mouth 2 (two) times daily.     multivitamin capsule  Take 1 capsule by mouth daily.     nitroGLYCERIN 0.4 MG SL tablet  Commonly known as:  NITROSTAT  Place 1 tablet (0.4 mg total) under the tongue every 5 (five) minutes as needed for chest pain.       Follow-up Information   Follow up with Herminio Commons, MD On 07/27/2014. (8:40 am)    Specialty:  Cardiology   Contact information:   [address] [place] [address] [phone]      TIME SPENT ON DISCHARGE INCLUDING PHYSICIAN TIME: > 30 MINUTES  Signed: Micaila Ziemba 07/17/2014, 10:22 AM

## 2014-07-17 NOTE — Progress Notes (Signed)
CARDIAC REHAB PHASE I   PRE:  Rate/Rhythm: 54 SR  BP:  Supine:   Sitting: 173/81  Standing:    SaO2: 100% RA  MODE:  Ambulation: 600+ ft   POST:  Rate/Rhythm: 73 SR  BP:  Supine:   Sitting: 193/81  Standing:    SaO2: 100% RA  [id number] Patient tolerated ambulation well, gait steady. PCI education complete with patient and patient's wife including restrictions, risk factor modification, CP, NTG use, and calling 911, Plavix use, stent card reviewed, heart healthy diet and exercise guidelines given. Pt verbalizes understanding of instructions given. Patient is interested in participating in the phase 2 cardiac rehab, permission given to send contact info to the cardiac rehab at [facility].   Sol Passer, MS, ACSM CCEP

## 2014-07-17 NOTE — Progress Notes (Signed)
Patient Profile: 68 y/o male with h/o HTN and HLD admitted for diagnostic LHC in the setting of an abnormal, "high risk" stress test.    Subjective: No chest pain overnight. Hasn't ambulated yet this am. Denies dyspnea. Right wrist ok.   Objective: Vital signs in last 24 hours: Temp:  [97.5 F (36.4 C)-97.9 F (36.6 C)] 97.8 F (36.6 C) (08/29 0744) Pulse Rate:  [53-75] 75 (08/29 0744) Resp:  [18] 18 (08/29 0744) BP: (139-163)/(62-84) 161/71 mmHg (08/29 0744) SpO2:  [96 %-100 %] 96 % (08/29 0744) Weight:  [190 lb 14.7 oz (86.6 kg)-191 lb (86.637 kg)] 190 lb 14.7 oz (86.6 kg) (08/29 0003) Last BM Date: 07/15/14  Intake/Output from previous day: 08/28 0701 - 08/29 0700 In: 963.3 [P.O.:240; I.V.:723.3] Out: 900 [Urine:900] Intake/Output this shift: Total I/O In: 480 [P.O.:480] Out: 375 [Urine:375]  Medications Current Facility-Administered Medications  Medication Dose Route Frequency Provider Last Rate Last Dose  . acetaminophen (TYLENOL) tablet 650 mg  650 mg Oral Q4H PRN [name], MD   650 mg at 07/16/14 2305  . aspirin chewable tablet 81 mg  81 mg Oral Daily [name], MD      . atorvastatin (LIPITOR) tablet 10 mg  10 mg Oral q1800 [name], MD   10 mg at 07/16/14 2109  . clopidogrel (PLAVIX) tablet 75 mg  75 mg Oral Q breakfast [name], MD      . loratadine (CLARITIN) tablet 10 mg  10 mg Oral Daily [name], MD      . metoprolol tartrate (LOPRESSOR) tablet 12.5 mg  12.5 mg Oral BID [name], MD   12.5 mg at 07/16/14 2109  . morphine 2 MG/ML injection 2 mg  2 mg Intravenous Q1H PRN [name], MD      . nitroGLYCERIN (NITROSTAT) SL tablet 0.4 mg  0.4 mg Sublingual Q5 min PRN [name], MD      . ondansetron [facility]) injection 4 mg  4 mg Intravenous Q6H PRN [name], MD        PE: General appearance: alert, cooperative and no distress Neck: no carotid bruit and no JVD Lungs:  clear to auscultation bilaterally Heart: regular rate and rhythm Extremities: no LEE Pulses: 2+ and symmetric Skin: warm and dry Neurologic: Grossly normal  Lab Results:   Recent Labs  07/16/14 1740 07/17/14 0427  WBC  --  6.8  HGB  --  14.8  HCT  --  42.8  PLT 177 177   BMET  Recent Labs  07/17/14 0427  NA 142  K 4.1  CL 105  CO2 26  GLUCOSE 104*  BUN 15  CREATININE 1.05  CALCIUM 8.8   PT/INR No results found for this basename: LABPROT, INR,  in the last 72 hours Cholesterol  Recent Labs  07/14/14 1230  CHOL 190   Cardiac Enzymes No components found with this basename: TROPONIN,  CKMB,   Studies/Results: LHC 07/16/14  IMPRESSIONS:  1. Normal left main coronary artery. 2. Severe disease in the proximal to mid left anterior descending artery. This was successfully treated with a 3.0 x 24 promus drug-eluting stent, postdilated to 3.8 mm in diameter. There was a large first diagonal which was jailed but continued to have TIMI-3 flow. 3. Widely patent left circumflex artery and its branches. 4. Widely patent nondominant right coronary artery. 5. Normal left ventricular systolic function. LVEDP 18 mmHg. Ejection fraction normal by prior echocardiogram.  Assessment/Plan  Active Problems:   Other and unspecified angina pectoris  1. Abnormal Stress Test: LHC was diagnostic for CAD (see plan below).  2. CAD: LHC revealed severe disease in the proximal to mid LAD. Day 1 s/p successful PCI utilizing a DES. No recurrent CP. EF normal by prior echo. Continue DAPT with ASA+ Plavix for a minimum of 1 year. Continue Lipitor, and metoprolol. Will also add an ACE to treat his HTN. Plan for OP cardiac rehab. Consider checking a P2Y12 at f/u to check responsiveness to Plavix.    3. HTN: BP elevated in the 333L systolic. Has h/o of elevated BP in the outpatient setting. Will continue BB therapy given concominant CAD. Will initiate ACE-I therapy for better BP control. Renal  function is stable.   4. HLD: LDL elevated at 116. Goal is < 70. Continue statin therapy with Lipitor. Recheck fasting lipid panel and LFTs in several weeks. If LDL not at goal, can consider adding Zetia.     LOS: 1 day    Brittainy M. Rosita Fire, PA-C 07/17/2014 8:02 AM

## 2014-07-27 ENCOUNTER — Ambulatory Visit (INDEPENDENT_AMBULATORY_CARE_PROVIDER_SITE_OTHER): Payer: Medicare Other | Admitting: Cardiovascular Disease

## 2014-07-27 ENCOUNTER — Encounter: Payer: Self-pay | Admitting: Cardiovascular Disease

## 2014-07-27 VITALS — BP 160/74 | HR 55 | Ht 72.0 in | Wt 191.0 lb

## 2014-07-27 DIAGNOSIS — Z9861 Coronary angioplasty status: Secondary | ICD-10-CM | POA: Diagnosis not present

## 2014-07-27 DIAGNOSIS — E785 Hyperlipidemia, unspecified: Secondary | ICD-10-CM

## 2014-07-27 DIAGNOSIS — I10 Essential (primary) hypertension: Secondary | ICD-10-CM

## 2014-07-27 DIAGNOSIS — Z87898 Personal history of other specified conditions: Secondary | ICD-10-CM

## 2014-07-27 DIAGNOSIS — Z9289 Personal history of other medical treatment: Secondary | ICD-10-CM

## 2014-07-27 DIAGNOSIS — Z955 Presence of coronary angioplasty implant and graft: Secondary | ICD-10-CM

## 2014-07-27 DIAGNOSIS — I2510 Atherosclerotic heart disease of native coronary artery without angina pectoris: Secondary | ICD-10-CM | POA: Diagnosis not present

## 2014-07-27 DIAGNOSIS — I209 Angina pectoris, unspecified: Secondary | ICD-10-CM | POA: Diagnosis not present

## 2014-07-27 MED ORDER — LISINOPRIL 20 MG PO TABS: 20.0000 mg | ORAL_TABLET | Freq: Every day | ORAL | Status: DC

## 2014-07-27 NOTE — Progress Notes (Signed)
Patient ID: Roger [name], male   DOB: [birth year], 68 y.o.   MRN: [id number]      SUBJECTIVE: The patient presents for followup after undergoing drug-eluting stent placement to the LAD. Coronary angiography demonstrated severe disease in the proximal to mid left anterior descending artery. This was successfully treated with a 3.0 x 24 promus drug-eluting stent. There was a large first diagonal which was jailed but continued to have TIMI-3 flow. He also had widely patent left circumflex artery and its branches and a patent RCA.  He is doing very well. He occasionally has fleeting chest twinges lasting seconds, but they are not particularly bothersome. He denies exertional chest pain and dyspnea. He is excited about going on a cruise next week with his wife. He plans to commence cardiac rehabilitation in early October.  He has been checking his blood pressure at home with systolic readings fairly consistently in the 140-160 mm mercury range.   Review of Systems: As per "subjective", otherwise negative.  No Known Allergies  Current Outpatient Prescriptions  Medication Sig Dispense Refill  . acetaminophen (TYLENOL) 500 MG tablet Take 1,000 mg by mouth every 6 (six) hours as needed (pain).      Marland Kitchen aspirin 81 MG tablet Take 81 mg by mouth daily.      Marland Kitchen atorvastatin (LIPITOR) 10 MG tablet Take 1 tablet (10 mg total) by mouth daily at 6 PM.  30 tablet  5  . cetirizine (ZYRTEC) 10 MG tablet Take 10 mg by mouth daily as needed for allergies.      Marland Kitchen clopidogrel (PLAVIX) 75 MG tablet Take 1 tablet (75 mg total) by mouth daily with breakfast.  30 tablet  11  . lisinopril (PRINIVIL,ZESTRIL) 10 MG tablet Take 1 tablet (10 mg total) by mouth daily.  30 tablet  5  . metoprolol tartrate (LOPRESSOR) 25 MG tablet Take 0.5 tablets (12.5 mg total) by mouth 2 (two) times daily.  30 tablet  3  . Multiple Vitamin (MULTIVITAMIN) capsule Take 1 capsule by mouth daily.      . nitroGLYCERIN (NITROSTAT) 0.4 MG SL tablet  Place 1 tablet (0.4 mg total) under the tongue every 5 (five) minutes as needed for chest pain.  25 tablet  3   No current facility-administered medications for this visit.    Past Medical History  Diagnosis Date  . Adenomatous polyps   . Seasonal allergies   . Impaired fasting glucose   . Hypertension     Past Surgical History  Procedure Laterality Date  . Colonoscopy  10/03/05    inflamed hyperplastic polyp/adenomatous poylp, left sided diverticula  . Knee arthroscopy Right ~ 1994  . Colonoscopy  03/03/2012    Procedure: COLONOSCOPY;  Surgeon: Daneil Dolin, MD;  [facility]: [facility];  Service: Endoscopy;  Laterality: N/A;  10:15  . Coronary angioplasty with stent placement  07/16/2014    "1"  . Knee arthroscopy Left ~ 2001    History   Social History  . Marital Status: Married    Spouse Name: N/A    Number of Children: N/A  . Years of Education: N/A   Occupational History  . Not on file.   Social History Main Topics  . Smoking status: Never Smoker   . Smokeless tobacco: Never Used  . Alcohol Use: 1.2 oz/week    2 Glasses of wine per week  . Drug Use: No  . Sexual Activity: Yes   Other Topics Concern  . Not on file  Social History Narrative  . No narrative on file     Filed Vitals:   07/27/14 0826 07/27/14 0833  BP: 162/76 160/74  Pulse: 56 55  Height: 6' (1.829 m)   Weight: 191 lb (86.637 kg)     PHYSICAL EXAM General: NAD HEENT: Normal. Neck: No JVD, no thyromegaly. Lungs: Clear to auscultation bilaterally with normal respiratory effort. CV: Nondisplaced PMI.  Regular rate and rhythm, normal S1/S2, no S3/S4, no murmur. No pretibial or periankle edema.  No carotid bruit.  Normal pedal pulses.  Abdomen: Soft, nontender, no hepatosplenomegaly, no distention.  Neurologic: Alert and oriented x 3.  Psych: Normal affect. Skin: Normal. Musculoskeletal: Normal range of motion, no gross deformities. Extremities: No clubbing or cyanosis.   ECG:  Most recent ECG reviewed.      ASSESSMENT AND PLAN: 1. CAD s/p LAD DES: Stable ischemic heart disease. He will commence cardiac rehabilitation in the near future. Continue aspirin, Plavix, metoprolol and statin therapy. Dual antiplatelet therapy for at least 1 year. 2. Essential HTN: Elevated BP today. Will increase lisinopril to 20 mg daily. I recommended he continue checking home BP three times per week at different times in the day, and to keep a log of this. 3. Hyperlipidemia: Now on simvastatin. Will recheck lipids in 3 months with LFT's.  Dispo: f/u 6 months.  Kate Sable, M.D., F.A.C.C.

## 2014-07-27 NOTE — Patient Instructions (Signed)
   Increase Lisinopril to 20mg  daily. New sent to pharmacy. Continue all other medications.   Labs for Lipids and LFT's to be done in 3 month. Office will mail labs when due. Your physician wants you to follow up in: 6 months.  You will receive a reminder letter in the mail one-two months in advance.  If you don't receive a letter, please call our office to schedule the follow up appointment

## 2014-08-26 ENCOUNTER — Encounter (HOSPITAL_COMMUNITY): Payer: Self-pay

## 2014-08-26 ENCOUNTER — Encounter (HOSPITAL_COMMUNITY)
Admission: RE | Admit: 2014-08-26 | Discharge: 2014-08-26 | Disposition: A | Discharge status: Home / Self Care | Payer: Medicare Other | Source: Ambulatory Visit | Attending: Cardiovascular Disease | Admitting: Cardiovascular Disease

## 2014-08-26 VITALS — BP 140/64 | HR 56 | Ht 72.0 in | Wt 191.1 lb

## 2014-08-26 DIAGNOSIS — I2510 Atherosclerotic heart disease of native coronary artery without angina pectoris: Secondary | ICD-10-CM | POA: Insufficient documentation

## 2014-08-26 DIAGNOSIS — Z5189 Encounter for other specified aftercare: Secondary | ICD-10-CM | POA: Insufficient documentation

## 2014-08-26 DIAGNOSIS — Z955 Presence of coronary angioplasty implant and graft: Secondary | ICD-10-CM

## 2014-08-26 DIAGNOSIS — Z9861 Coronary angioplasty status: Secondary | ICD-10-CM | POA: Insufficient documentation

## 2014-08-26 NOTE — Progress Notes (Signed)
Patient was referred to CR post Coronary Stent placement V45.92 by Dr. Bronson Ing. During orientation advised patient on arrival and appointment times what to wear, what to do before, during and after exercise. Reviewed attendance and class policy. Talked about inclement weather and class consultation policy. Pt is scheduled to start Cardiac Rehab on 08/30/14 at 9:30 am. Pt was advised to come to class 5 minutes before class starts. He was also given instructions on meeting with the dietician and attending the Family Structure classes. Pt is eager to get started. Patient was able to complete 6 minute walk test.

## 2014-08-26 NOTE — Patient Instructions (Signed)
Pt has finished orientation and is scheduled to start CR on 08/30/14 at 9:30 am. Pt has been instructed to arrive to class 15 minutes early for scheduled class. Pt has been instructed to wear comfortable clothing and shoes with rubber soles. Pt has been told to take their medications 1 hour prior to coming to class.  If the patient is not going to attend class, he/she has been instructed to call.

## 2014-08-30 ENCOUNTER — Encounter (HOSPITAL_COMMUNITY)
Admission: RE | Admit: 2014-08-30 | Discharge: 2014-08-30 | Disposition: A | Discharge status: Home / Self Care | Payer: Medicare Other | Source: Ambulatory Visit | Attending: Cardiovascular Disease | Admitting: Cardiovascular Disease

## 2014-08-30 DIAGNOSIS — Z9861 Coronary angioplasty status: Secondary | ICD-10-CM | POA: Diagnosis not present

## 2014-08-30 DIAGNOSIS — Z5189 Encounter for other specified aftercare: Secondary | ICD-10-CM | POA: Diagnosis not present

## 2014-08-30 DIAGNOSIS — I2510 Atherosclerotic heart disease of native coronary artery without angina pectoris: Secondary | ICD-10-CM | POA: Diagnosis not present

## 2014-09-01 ENCOUNTER — Encounter (HOSPITAL_COMMUNITY)
Admission: RE | Admit: 2014-09-01 | Discharge: 2014-09-01 | Disposition: A | Discharge status: Home / Self Care | Payer: Medicare Other | Source: Ambulatory Visit | Attending: Cardiovascular Disease | Admitting: Cardiovascular Disease

## 2014-09-01 DIAGNOSIS — I2510 Atherosclerotic heart disease of native coronary artery without angina pectoris: Secondary | ICD-10-CM | POA: Diagnosis not present

## 2014-09-01 DIAGNOSIS — Z9861 Coronary angioplasty status: Secondary | ICD-10-CM | POA: Diagnosis not present

## 2014-09-01 DIAGNOSIS — Z5189 Encounter for other specified aftercare: Secondary | ICD-10-CM | POA: Diagnosis not present

## 2014-09-03 ENCOUNTER — Encounter (HOSPITAL_COMMUNITY)
Admission: RE | Admit: 2014-09-03 | Discharge: 2014-09-03 | Disposition: A | Discharge status: Home / Self Care | Payer: Medicare Other | Source: Ambulatory Visit | Attending: Cardiovascular Disease | Admitting: Cardiovascular Disease

## 2014-09-03 DIAGNOSIS — Z9861 Coronary angioplasty status: Secondary | ICD-10-CM | POA: Diagnosis not present

## 2014-09-03 DIAGNOSIS — Z5189 Encounter for other specified aftercare: Secondary | ICD-10-CM | POA: Diagnosis not present

## 2014-09-03 DIAGNOSIS — I2510 Atherosclerotic heart disease of native coronary artery without angina pectoris: Secondary | ICD-10-CM | POA: Diagnosis not present

## 2014-09-06 ENCOUNTER — Encounter (HOSPITAL_COMMUNITY)
Admission: RE | Admit: 2014-09-06 | Discharge: 2014-09-06 | Disposition: A | Discharge status: Home / Self Care | Payer: Medicare Other | Source: Ambulatory Visit | Attending: Cardiovascular Disease | Admitting: Cardiovascular Disease

## 2014-09-06 DIAGNOSIS — Z9861 Coronary angioplasty status: Secondary | ICD-10-CM | POA: Diagnosis not present

## 2014-09-06 DIAGNOSIS — Z5189 Encounter for other specified aftercare: Secondary | ICD-10-CM | POA: Diagnosis not present

## 2014-09-06 DIAGNOSIS — I2510 Atherosclerotic heart disease of native coronary artery without angina pectoris: Secondary | ICD-10-CM | POA: Diagnosis not present

## 2014-09-08 ENCOUNTER — Encounter (HOSPITAL_COMMUNITY)
Admission: RE | Admit: 2014-09-08 | Discharge: 2014-09-08 | Disposition: A | Discharge status: Home / Self Care | Payer: Medicare Other | Source: Ambulatory Visit | Attending: Cardiovascular Disease | Admitting: Cardiovascular Disease

## 2014-09-08 DIAGNOSIS — Z9861 Coronary angioplasty status: Secondary | ICD-10-CM | POA: Diagnosis not present

## 2014-09-08 DIAGNOSIS — I2510 Atherosclerotic heart disease of native coronary artery without angina pectoris: Secondary | ICD-10-CM | POA: Diagnosis not present

## 2014-09-08 DIAGNOSIS — Z5189 Encounter for other specified aftercare: Secondary | ICD-10-CM | POA: Diagnosis not present

## 2014-09-10 ENCOUNTER — Encounter (HOSPITAL_COMMUNITY)
Admission: RE | Admit: 2014-09-10 | Discharge: 2014-09-10 | Disposition: A | Discharge status: Home / Self Care | Payer: Medicare Other | Source: Ambulatory Visit | Attending: Cardiovascular Disease | Admitting: Cardiovascular Disease

## 2014-09-10 DIAGNOSIS — I2510 Atherosclerotic heart disease of native coronary artery without angina pectoris: Secondary | ICD-10-CM | POA: Diagnosis not present

## 2014-09-10 DIAGNOSIS — Z9861 Coronary angioplasty status: Secondary | ICD-10-CM | POA: Diagnosis not present

## 2014-09-10 DIAGNOSIS — Z5189 Encounter for other specified aftercare: Secondary | ICD-10-CM | POA: Diagnosis not present

## 2014-09-13 ENCOUNTER — Encounter (HOSPITAL_COMMUNITY)
Admission: RE | Admit: 2014-09-13 | Discharge: 2014-09-13 | Disposition: A | Discharge status: Home / Self Care | Payer: Medicare Other | Source: Ambulatory Visit | Attending: Cardiovascular Disease | Admitting: Cardiovascular Disease

## 2014-09-13 DIAGNOSIS — Z9861 Coronary angioplasty status: Secondary | ICD-10-CM | POA: Diagnosis not present

## 2014-09-13 DIAGNOSIS — I2510 Atherosclerotic heart disease of native coronary artery without angina pectoris: Secondary | ICD-10-CM | POA: Diagnosis not present

## 2014-09-13 DIAGNOSIS — Z5189 Encounter for other specified aftercare: Secondary | ICD-10-CM | POA: Diagnosis not present

## 2014-09-15 ENCOUNTER — Encounter (HOSPITAL_COMMUNITY)
Admission: RE | Admit: 2014-09-15 | Discharge: 2014-09-15 | Disposition: A | Discharge status: Home / Self Care | Payer: Medicare Other | Source: Ambulatory Visit | Attending: Cardiovascular Disease | Admitting: Cardiovascular Disease

## 2014-09-15 DIAGNOSIS — Z5189 Encounter for other specified aftercare: Secondary | ICD-10-CM | POA: Diagnosis not present

## 2014-09-15 DIAGNOSIS — Z9861 Coronary angioplasty status: Secondary | ICD-10-CM | POA: Diagnosis not present

## 2014-09-15 DIAGNOSIS — I2510 Atherosclerotic heart disease of native coronary artery without angina pectoris: Secondary | ICD-10-CM | POA: Diagnosis not present

## 2014-09-15 NOTE — Progress Notes (Signed)
Cardiac Rehabilitation Program Outcomes Report   Orientation:  08/26/14 Graduate Date:  tbd Discharge Date:  tbd # of sessions completed: 3  Cardiologist: Bronson Ing Family MD:  Wolfgang Phoenix Class Time:  0930  A.  Exercise Program:  Tolerates exercise @ 3.59 METS for 15 minutes and Walk Test Results:  Pre: 3.0 mets  B.  Mental Health:  Good mental attitude  C.  Education/Instruction/Skills  Accurately checks own pulse.  Rest:  68  Exercise:  79  Uses Perceived Exertion Scale and/or Dyspnea Scale  D.  Nutrition/Weight Control/Body Composition:  Adherence to prescribed nutrition program: good    E.  Blood Lipids    Lab Results  Component Value Date   CHOL 190 07/14/2014   HDL 51 07/14/2014   LDLCALC 116* 07/14/2014   TRIG 114 07/14/2014   CHOLHDL 3.7 07/14/2014    F.  Lifestyle Changes:  Making positive lifestyle changes  G.  Symptoms noted with exercise:  Asymptomatic  Report Completed By:  Benay Pillow RN   Comments:  First week progress note.

## 2014-09-17 ENCOUNTER — Encounter (HOSPITAL_COMMUNITY)
Admission: RE | Admit: 2014-09-17 | Discharge: 2014-09-17 | Disposition: A | Discharge status: Home / Self Care | Payer: Medicare Other | Source: Ambulatory Visit | Attending: Cardiovascular Disease | Admitting: Cardiovascular Disease

## 2014-09-17 DIAGNOSIS — Z5189 Encounter for other specified aftercare: Secondary | ICD-10-CM | POA: Diagnosis not present

## 2014-09-17 DIAGNOSIS — I2510 Atherosclerotic heart disease of native coronary artery without angina pectoris: Secondary | ICD-10-CM | POA: Diagnosis not present

## 2014-09-17 DIAGNOSIS — Z9861 Coronary angioplasty status: Secondary | ICD-10-CM | POA: Diagnosis not present

## 2014-09-20 ENCOUNTER — Encounter (HOSPITAL_COMMUNITY)
Admission: RE | Admit: 2014-09-20 | Discharge: 2014-09-20 | Disposition: A | Discharge status: Home / Self Care | Payer: Medicare Other | Source: Ambulatory Visit | Attending: Cardiovascular Disease | Admitting: Cardiovascular Disease

## 2014-09-20 DIAGNOSIS — I2510 Atherosclerotic heart disease of native coronary artery without angina pectoris: Secondary | ICD-10-CM | POA: Diagnosis not present

## 2014-09-20 DIAGNOSIS — Z9861 Coronary angioplasty status: Secondary | ICD-10-CM | POA: Insufficient documentation

## 2014-09-20 DIAGNOSIS — Z5189 Encounter for other specified aftercare: Secondary | ICD-10-CM | POA: Diagnosis not present

## 2014-09-21 DIAGNOSIS — Z23 Encounter for immunization: Secondary | ICD-10-CM | POA: Diagnosis not present

## 2014-09-22 ENCOUNTER — Encounter (HOSPITAL_COMMUNITY): Payer: Medicare Other

## 2014-09-22 DIAGNOSIS — Z5189 Encounter for other specified aftercare: Secondary | ICD-10-CM | POA: Diagnosis not present

## 2014-09-24 ENCOUNTER — Encounter (HOSPITAL_COMMUNITY)
Admission: RE | Admit: 2014-09-24 | Discharge: 2014-09-24 | Disposition: A | Discharge status: Home / Self Care | Payer: Medicare Other | Source: Ambulatory Visit | Attending: Cardiovascular Disease | Admitting: Cardiovascular Disease

## 2014-09-24 ENCOUNTER — Encounter: Payer: Self-pay | Admitting: Family Medicine

## 2014-09-24 ENCOUNTER — Ambulatory Visit (INDEPENDENT_AMBULATORY_CARE_PROVIDER_SITE_OTHER): Payer: Medicare Other | Admitting: Family Medicine

## 2014-09-24 VITALS — BP 130/70 | Temp 98.2°F | Ht 72.0 in | Wt 191.0 lb

## 2014-09-24 DIAGNOSIS — I209 Angina pectoris, unspecified: Secondary | ICD-10-CM

## 2014-09-24 DIAGNOSIS — Z5189 Encounter for other specified aftercare: Secondary | ICD-10-CM | POA: Diagnosis not present

## 2014-09-24 DIAGNOSIS — R21 Rash and other nonspecific skin eruption: Secondary | ICD-10-CM

## 2014-09-24 MED ORDER — PREDNISONE 10 MG PO TABS: ORAL_TABLET | ORAL | Status: DC

## 2014-09-24 NOTE — Progress Notes (Signed)
   Subjective:    Patient ID: Roger [name], male    DOB: October 08, 1946, 68 y.o.   MRN: [id number]  Rash This is a recurrent problem. The current episode started today (Same s/s back in July 2014). The rash is diffuse. The rash is characterized by itchiness and redness. Associated with: working out in the yard. Past treatments include nothing.   Patient had a similar rash last year. This was before he was on any medication.  Came up as patchy hive-like itchy splotches. Itching seriously  Started after working on leaves  No spraying   Sweating and   Review of Systems  Skin: Positive for rash.  no shortness of breath no headache no wheezing     Objective:   Physical Exam  Alert no apparent distress vital stable lungs clear heart regular in rhythm H&T normal skin multiple discrete urticaria-like patches on trunk and upper extremities      Assessment & Plan:  Impression urticarial rash very pruritic after outdoors exposure true etiology unclear discussed plan prednisone taper. Antihistamine including Benadryl and Zyrtec. Local measures discussed. WSL

## 2014-09-27 ENCOUNTER — Encounter (HOSPITAL_COMMUNITY)
Admission: RE | Admit: 2014-09-27 | Discharge: 2014-09-27 | Disposition: A | Discharge status: Home / Self Care | Payer: Medicare Other | Source: Ambulatory Visit | Attending: Cardiovascular Disease | Admitting: Cardiovascular Disease

## 2014-09-27 DIAGNOSIS — Z5189 Encounter for other specified aftercare: Secondary | ICD-10-CM | POA: Diagnosis not present

## 2014-09-29 ENCOUNTER — Encounter (HOSPITAL_COMMUNITY)
Admission: RE | Admit: 2014-09-29 | Discharge: 2014-09-29 | Disposition: A | Discharge status: Home / Self Care | Payer: Medicare Other | Source: Ambulatory Visit | Attending: Cardiovascular Disease | Admitting: Cardiovascular Disease

## 2014-09-29 DIAGNOSIS — Z5189 Encounter for other specified aftercare: Secondary | ICD-10-CM | POA: Diagnosis not present

## 2014-10-01 ENCOUNTER — Encounter (HOSPITAL_COMMUNITY)
Admission: RE | Admit: 2014-10-01 | Discharge: 2014-10-01 | Disposition: A | Discharge status: Home / Self Care | Payer: Medicare Other | Source: Ambulatory Visit | Attending: Cardiovascular Disease | Admitting: Cardiovascular Disease

## 2014-10-01 DIAGNOSIS — Z5189 Encounter for other specified aftercare: Secondary | ICD-10-CM | POA: Diagnosis not present

## 2014-10-04 ENCOUNTER — Encounter (HOSPITAL_COMMUNITY)
Admission: RE | Admit: 2014-10-04 | Discharge: 2014-10-04 | Disposition: A | Discharge status: Home / Self Care | Payer: Medicare Other | Source: Ambulatory Visit | Attending: Cardiovascular Disease | Admitting: Cardiovascular Disease

## 2014-10-04 DIAGNOSIS — Z5189 Encounter for other specified aftercare: Secondary | ICD-10-CM | POA: Diagnosis not present

## 2014-10-06 ENCOUNTER — Encounter (HOSPITAL_COMMUNITY)
Admission: RE | Admit: 2014-10-06 | Discharge: 2014-10-06 | Disposition: A | Discharge status: Home / Self Care | Payer: Medicare Other | Source: Ambulatory Visit | Attending: Cardiovascular Disease | Admitting: Cardiovascular Disease

## 2014-10-06 DIAGNOSIS — Z5189 Encounter for other specified aftercare: Secondary | ICD-10-CM | POA: Diagnosis not present

## 2014-10-08 ENCOUNTER — Encounter (HOSPITAL_COMMUNITY)
Admission: RE | Admit: 2014-10-08 | Discharge: 2014-10-08 | Disposition: A | Discharge status: Home / Self Care | Payer: Medicare Other | Source: Ambulatory Visit | Attending: Cardiovascular Disease | Admitting: Cardiovascular Disease

## 2014-10-08 DIAGNOSIS — Z5189 Encounter for other specified aftercare: Secondary | ICD-10-CM | POA: Diagnosis not present

## 2014-10-08 NOTE — Progress Notes (Signed)
Cardiac Rehabilitation Program Outcomes Report   Orientation:  08/26/14 Graduate Date:  tbd Discharge Date:  tbd # of sessions completed: 18  Cardiologist: Gaynelle Cage MD:  Wolfgang Phoenix Class Time:  0930  A.  Exercise Program:  Tolerates exercise @ 3.57 METS for 15 minutes  B.  Mental Health:  Good mental attitude  C.  Education/Instruction/Skills  Accurately checks own pulse.  Rest:  67  Exercise:  93 and Knows THR for exercise  Uses Perceived Exertion Scale and/or Dyspnea Scale  D.  Nutrition/Weight Control/Body Composition:  Adherence to prescribed nutrition program: good    E.  Blood Lipids    Lab Results  Component Value Date   CHOL 190 07/14/2014   HDL 51 07/14/2014   LDLCALC 116* 07/14/2014   TRIG 114 07/14/2014   CHOLHDL 3.7 07/14/2014    F.  Lifestyle Changes:  Making positive lifestyle changes  G.  Symptoms noted with exercise:  Asymptomatic  Report Completed By:  Stevphen Rochester RN   Comments:  This is patients halfway note. Pt is doing well in program.

## 2014-10-11 ENCOUNTER — Encounter (HOSPITAL_COMMUNITY)
Admission: RE | Admit: 2014-10-11 | Discharge: 2014-10-11 | Disposition: A | Discharge status: Home / Self Care | Payer: Medicare Other | Source: Ambulatory Visit | Attending: Cardiovascular Disease | Admitting: Cardiovascular Disease

## 2014-10-11 DIAGNOSIS — Z5189 Encounter for other specified aftercare: Secondary | ICD-10-CM | POA: Diagnosis not present

## 2014-10-13 ENCOUNTER — Encounter (HOSPITAL_COMMUNITY)
Admission: RE | Admit: 2014-10-13 | Discharge: 2014-10-13 | Disposition: A | Discharge status: Home / Self Care | Payer: Medicare Other | Source: Ambulatory Visit | Attending: Cardiovascular Disease | Admitting: Cardiovascular Disease

## 2014-10-13 DIAGNOSIS — Z5189 Encounter for other specified aftercare: Secondary | ICD-10-CM | POA: Diagnosis not present

## 2014-10-15 ENCOUNTER — Encounter (HOSPITAL_COMMUNITY): Payer: Medicare Other

## 2014-10-18 ENCOUNTER — Encounter (HOSPITAL_COMMUNITY)
Admission: RE | Admit: 2014-10-18 | Discharge: 2014-10-18 | Disposition: A | Discharge status: Home / Self Care | Payer: Medicare Other | Source: Ambulatory Visit | Attending: Cardiovascular Disease | Admitting: Cardiovascular Disease

## 2014-10-18 ENCOUNTER — Telehealth: Payer: Self-pay | Admitting: Family Medicine

## 2014-10-18 DIAGNOSIS — Z125 Encounter for screening for malignant neoplasm of prostate: Secondary | ICD-10-CM

## 2014-10-18 DIAGNOSIS — Z5189 Encounter for other specified aftercare: Secondary | ICD-10-CM | POA: Diagnosis not present

## 2014-10-18 DIAGNOSIS — E785 Hyperlipidemia, unspecified: Secondary | ICD-10-CM

## 2014-10-18 DIAGNOSIS — Z79899 Other long term (current) drug therapy: Secondary | ICD-10-CM

## 2014-10-18 NOTE — Telephone Encounter (Signed)
Lip liv m7 psa 

## 2014-10-18 NOTE — Telephone Encounter (Signed)
Patient was notified that blood work has been ordered.  

## 2014-10-18 NOTE — Telephone Encounter (Signed)
Pt has appt 12/4  Last labs ran were [facility] 8/29 BMP, CBC Last labs here are Lipid, Hept Func, INR, CBC, BMP

## 2014-10-19 DIAGNOSIS — E785 Hyperlipidemia, unspecified: Secondary | ICD-10-CM | POA: Diagnosis not present

## 2014-10-19 DIAGNOSIS — Z79899 Other long term (current) drug therapy: Secondary | ICD-10-CM | POA: Diagnosis not present

## 2014-10-19 DIAGNOSIS — Z125 Encounter for screening for malignant neoplasm of prostate: Secondary | ICD-10-CM | POA: Diagnosis not present

## 2014-10-19 LAB — HEPATIC FUNCTION PANEL
ALT: 25 U/L (ref 0–53)
AST: 23 U/L (ref 0–37)
Albumin: 4.1 g/dL (ref 3.5–5.2)
Alkaline Phosphatase: 69 U/L (ref 39–117)
Bilirubin, Direct: 0.2 mg/dL (ref 0.0–0.3)
Indirect Bilirubin: 0.7 mg/dL (ref 0.2–1.2)
TOTAL PROTEIN: 6.4 g/dL (ref 6.0–8.3)
Total Bilirubin: 0.9 mg/dL (ref 0.2–1.2)

## 2014-10-19 LAB — LIPID PANEL
CHOL/HDL RATIO: 2.5 ratio
CHOLESTEROL: 137 mg/dL (ref 0–200)
HDL: 54 mg/dL (ref >39)
LDL CALC: 67 mg/dL (ref 0–99)
Triglycerides: 81 mg/dL (ref <150)
VLDL: 16 mg/dL (ref 0–40)

## 2014-10-19 LAB — BASIC METABOLIC PANEL
BUN: 23 mg/dL (ref 6–23)
CHLORIDE: 105 meq/L (ref 96–112)
CO2: 30 mEq/L (ref 19–32)
Calcium: 8.9 mg/dL (ref 8.4–10.5)
Creat: 1.3 mg/dL (ref 0.50–1.35)
Glucose, Bld: 101 mg/dL — ABNORMAL HIGH (ref 70–99)
POTASSIUM: 4.4 meq/L (ref 3.5–5.3)
SODIUM: 139 meq/L (ref 135–145)

## 2014-10-20 ENCOUNTER — Encounter (HOSPITAL_COMMUNITY)
Admission: RE | Admit: 2014-10-20 | Discharge: 2014-10-20 | Disposition: A | Discharge status: Home / Self Care | Payer: Medicare Other | Source: Ambulatory Visit | Attending: Cardiovascular Disease | Admitting: Cardiovascular Disease

## 2014-10-20 DIAGNOSIS — Z5189 Encounter for other specified aftercare: Secondary | ICD-10-CM | POA: Insufficient documentation

## 2014-10-20 DIAGNOSIS — I2510 Atherosclerotic heart disease of native coronary artery without angina pectoris: Secondary | ICD-10-CM | POA: Diagnosis not present

## 2014-10-20 DIAGNOSIS — Z9861 Coronary angioplasty status: Secondary | ICD-10-CM | POA: Diagnosis not present

## 2014-10-20 LAB — PSA: PSA: 0.39 ng/mL (ref <=4.00)

## 2014-10-22 ENCOUNTER — Encounter (HOSPITAL_COMMUNITY): Payer: Medicare Other

## 2014-10-22 ENCOUNTER — Encounter: Payer: Self-pay | Admitting: Family Medicine

## 2014-10-22 ENCOUNTER — Ambulatory Visit (INDEPENDENT_AMBULATORY_CARE_PROVIDER_SITE_OTHER): Payer: Medicare Other | Admitting: Family Medicine

## 2014-10-22 VITALS — BP 130/78 | Ht 72.0 in | Wt 189.0 lb

## 2014-10-22 DIAGNOSIS — Z0000 Encounter for general adult medical examination without abnormal findings: Secondary | ICD-10-CM

## 2014-10-22 DIAGNOSIS — Z23 Encounter for immunization: Secondary | ICD-10-CM

## 2014-10-22 DIAGNOSIS — E785 Hyperlipidemia, unspecified: Secondary | ICD-10-CM | POA: Diagnosis not present

## 2014-10-22 DIAGNOSIS — I10 Essential (primary) hypertension: Secondary | ICD-10-CM | POA: Diagnosis not present

## 2014-10-22 NOTE — Progress Notes (Signed)
Subjective:    Patient ID: Roger [name], male    DOB: 26-Jul-1946, 68 y.o.   MRN: [id number]  HPI AWV- Annual Wellness Visit  The patient was seen for their annual wellness visit. The patient's past medical history, surgical history, and family history were reviewed. Pertinent vaccines were reviewed ( tetanus, pneumonia, shingles, flu) The patient's medication list was reviewed and updated.  The height and weight were entered. The patient's current BMI is: 25  Cognitive screening was completed. Outcome of Mini - Cog: pass Falls within the past 6 months: No  Current tobacco usage: No (All patients who use tobacco were given written and verbal information on quitting)  Recent listing of emergency department/hospitalizations over the past year were reviewed. current specialist the patient sees on a regular basis: Cardiologist, Dr. Jacinta Shoe   Medicare annual wellness visit patient questionnaire was reviewed.  A written screening schedule for the patient for the next 5-10 years was given. Appropriate discussion of followup regarding next visit was discussed.  Results for orders placed or performed in visit on 10/18/14  Lipid panel  Result Value Ref Range   Cholesterol 137 0 - 200 mg/dL   Triglycerides 81 <150 mg/dL   HDL 54 >39 mg/dL   Total CHOL/HDL Ratio 2.5 Ratio   VLDL 16 0 - 40 mg/dL   LDL Cholesterol 67 0 - 99 mg/dL  Hepatic function panel  Result Value Ref Range   Total Bilirubin 0.9 0.2 - 1.2 mg/dL   Bilirubin, Direct 0.2 0.0 - 0.3 mg/dL   Indirect Bilirubin 0.7 0.2 - 1.2 mg/dL   Alkaline Phosphatase 69 39 - 117 U/L   AST 23 0 - 37 U/L   ALT 25 0 - 53 U/L   Total Protein 6.4 6.0 - 8.3 g/dL   Albumin 4.1 3.5 - 5.2 g/dL  Basic metabolic panel  Result Value Ref Range   Sodium 139 135 - 145 mEq/L   Potassium 4.4 3.5 - 5.3 mEq/L   Chloride 105 96 - 112 mEq/L   CO2 30 19 - 32 mEq/L   Glucose, Bld 101 (H) 70 - 99 mg/dL   BUN 23 6 - 23 mg/dL   Creat 1.30 0.50 -  1.35 mg/dL   Calcium 8.9 8.4 - 10.5 mg/dL  PSA  Result Value Ref Range   PSA 0.39 <=4.00 ng/mL        Review of Systems  Constitutional: Negative for fever, activity change and appetite change.  HENT: Negative for congestion and rhinorrhea.   Eyes: Negative for discharge.  Respiratory: Negative for cough and wheezing.   Cardiovascular: Negative for chest pain.  Gastrointestinal: Negative for vomiting, abdominal pain and blood in stool.  Genitourinary: Negative for frequency and difficulty urinating.  Musculoskeletal: Negative for neck pain.  Skin: Negative for rash.  Allergic/Immunologic: Negative for environmental allergies and food allergies.  Neurological: Negative for weakness and headaches.  Psychiatric/Behavioral: Negative for agitation.  All other systems reviewed and are negative.      Objective:   Physical Exam  Constitutional: He appears well-developed and well-nourished.  HENT:  Head: Normocephalic and atraumatic.  Right Ear: External ear normal.  Left Ear: External ear normal.  Nose: Nose normal.  Mouth/Throat: Oropharynx is clear and moist.  Eyes: EOM are normal. Pupils are equal, round, and reactive to light.  Neck: Normal range of motion. Neck supple. No thyromegaly present.  Cardiovascular: Normal rate, regular rhythm and normal heart sounds.   No murmur heard. Pulmonary/Chest: Effort normal and  breath sounds normal. No respiratory distress. He has no wheezes.  Abdominal: Soft. Bowel sounds are normal. He exhibits no distension and no mass. There is no tenderness.  Genitourinary: Penis normal.  Musculoskeletal: Normal range of motion. He exhibits no edema.  Lymphadenopathy:    He has no cervical adenopathy.  Neurological: He is alert. He exhibits normal muscle tone.  Skin: Skin is warm and dry. No erythema.  Psychiatric: He has a normal mood and affect. His behavior is normal. Judgment normal.  Vitals reviewed.         Assessment & Plan:    Impression wellness exam #2 hyperlipidemia discussed at length. Particularly in terms of new diagnosis of #3. LDL now down below 70. #3 coronary artery disease status post stenting. Patient has had transient fast chest pain non-lasting more than a few seconds plan reassured deftly noncardiac. Lipids now at goal discussed. Prevnar shot today. Recheck as needed. WSL

## 2014-10-25 ENCOUNTER — Encounter (HOSPITAL_COMMUNITY)
Admission: RE | Admit: 2014-10-25 | Discharge: 2014-10-25 | Disposition: A | Discharge status: Home / Self Care | Payer: Medicare Other | Source: Ambulatory Visit | Attending: Cardiovascular Disease | Admitting: Cardiovascular Disease

## 2014-10-25 DIAGNOSIS — Z5189 Encounter for other specified aftercare: Secondary | ICD-10-CM | POA: Diagnosis not present

## 2014-10-27 ENCOUNTER — Encounter (HOSPITAL_COMMUNITY)
Admission: RE | Admit: 2014-10-27 | Discharge: 2014-10-27 | Disposition: A | Discharge status: Home / Self Care | Payer: Medicare Other | Source: Ambulatory Visit | Attending: Cardiovascular Disease | Admitting: Cardiovascular Disease

## 2014-10-27 DIAGNOSIS — Z5189 Encounter for other specified aftercare: Secondary | ICD-10-CM | POA: Diagnosis not present

## 2014-10-28 ENCOUNTER — Encounter (HOSPITAL_COMMUNITY): Payer: Self-pay | Admitting: Interventional Cardiology

## 2014-10-29 ENCOUNTER — Encounter (HOSPITAL_COMMUNITY)
Admission: RE | Admit: 2014-10-29 | Discharge: 2014-10-29 | Disposition: A | Discharge status: Home / Self Care | Payer: Medicare Other | Source: Ambulatory Visit | Attending: Cardiovascular Disease | Admitting: Cardiovascular Disease

## 2014-10-29 DIAGNOSIS — Z5189 Encounter for other specified aftercare: Secondary | ICD-10-CM | POA: Diagnosis not present

## 2014-11-01 ENCOUNTER — Encounter (HOSPITAL_COMMUNITY): Payer: Medicare Other

## 2014-11-03 ENCOUNTER — Encounter (HOSPITAL_COMMUNITY)
Admission: RE | Admit: 2014-11-03 | Discharge: 2014-11-03 | Disposition: A | Discharge status: Home / Self Care | Payer: Medicare Other | Source: Ambulatory Visit | Attending: Cardiovascular Disease | Admitting: Cardiovascular Disease

## 2014-11-03 DIAGNOSIS — Z5189 Encounter for other specified aftercare: Secondary | ICD-10-CM | POA: Diagnosis not present

## 2014-11-05 ENCOUNTER — Encounter (HOSPITAL_COMMUNITY)
Admission: RE | Admit: 2014-11-05 | Discharge: 2014-11-05 | Disposition: A | Discharge status: Home / Self Care | Payer: Medicare Other | Source: Ambulatory Visit | Attending: Cardiovascular Disease | Admitting: Cardiovascular Disease

## 2014-11-05 DIAGNOSIS — Z5189 Encounter for other specified aftercare: Secondary | ICD-10-CM | POA: Diagnosis not present

## 2014-11-08 ENCOUNTER — Encounter (HOSPITAL_COMMUNITY)
Admission: RE | Admit: 2014-11-08 | Discharge: 2014-11-08 | Disposition: A | Discharge status: Home / Self Care | Payer: Medicare Other | Source: Ambulatory Visit | Attending: Cardiovascular Disease | Admitting: Cardiovascular Disease

## 2014-11-08 DIAGNOSIS — Z5189 Encounter for other specified aftercare: Secondary | ICD-10-CM | POA: Diagnosis not present

## 2014-11-09 ENCOUNTER — Telehealth: Payer: Self-pay | Admitting: Cardiovascular Disease

## 2014-11-09 MED ORDER — METOPROLOL TARTRATE 25 MG PO TABS: 12.5000 mg | ORAL_TABLET | Freq: Two times a day (BID) | ORAL | Status: DC

## 2014-11-09 NOTE — Telephone Encounter (Signed)
Done

## 2014-11-09 NOTE — Telephone Encounter (Signed)
Patient called stating he needs refill on metoprolol tartrate (LOPRESSOR) 25 MG tablet [place], [place] .Marland Kitchen States that [facility] told patient to contact our office.

## 2014-11-10 ENCOUNTER — Encounter (HOSPITAL_COMMUNITY)
Admission: RE | Admit: 2014-11-10 | Discharge: 2014-11-10 | Disposition: A | Discharge status: Home / Self Care | Payer: Medicare Other | Source: Ambulatory Visit | Attending: Cardiovascular Disease | Admitting: Cardiovascular Disease

## 2014-11-10 DIAGNOSIS — Z5189 Encounter for other specified aftercare: Secondary | ICD-10-CM | POA: Diagnosis not present

## 2014-11-12 ENCOUNTER — Encounter (HOSPITAL_COMMUNITY): Payer: Medicare Other

## 2014-11-15 ENCOUNTER — Encounter (HOSPITAL_COMMUNITY)
Admission: RE | Admit: 2014-11-15 | Discharge: 2014-11-15 | Disposition: A | Discharge status: Home / Self Care | Payer: Medicare Other | Source: Ambulatory Visit | Attending: Cardiovascular Disease | Admitting: Cardiovascular Disease

## 2014-11-15 DIAGNOSIS — Z5189 Encounter for other specified aftercare: Secondary | ICD-10-CM | POA: Diagnosis not present

## 2014-11-17 ENCOUNTER — Encounter (HOSPITAL_COMMUNITY)
Admission: RE | Admit: 2014-11-17 | Discharge: 2014-11-17 | Disposition: A | Discharge status: Home / Self Care | Payer: Medicare Other | Source: Ambulatory Visit | Attending: Cardiovascular Disease | Admitting: Cardiovascular Disease

## 2014-11-17 DIAGNOSIS — Z5189 Encounter for other specified aftercare: Secondary | ICD-10-CM | POA: Diagnosis not present

## 2014-11-19 ENCOUNTER — Encounter (HOSPITAL_COMMUNITY): Payer: Medicare Other

## 2014-11-22 ENCOUNTER — Encounter (HOSPITAL_COMMUNITY)
Admission: RE | Admit: 2014-11-22 | Discharge: 2014-11-22 | Disposition: A | Discharge status: Home / Self Care | Payer: Medicare Other | Source: Ambulatory Visit | Attending: Cardiovascular Disease | Admitting: Cardiovascular Disease

## 2014-11-22 DIAGNOSIS — Z9861 Coronary angioplasty status: Secondary | ICD-10-CM | POA: Insufficient documentation

## 2014-11-22 DIAGNOSIS — Z5189 Encounter for other specified aftercare: Secondary | ICD-10-CM | POA: Diagnosis not present

## 2014-11-22 DIAGNOSIS — I2510 Atherosclerotic heart disease of native coronary artery without angina pectoris: Secondary | ICD-10-CM | POA: Diagnosis not present

## 2014-11-24 ENCOUNTER — Encounter (HOSPITAL_COMMUNITY)
Admission: RE | Admit: 2014-11-24 | Discharge: 2014-11-24 | Disposition: A | Discharge status: Home / Self Care | Payer: Medicare Other | Source: Ambulatory Visit | Attending: Cardiovascular Disease | Admitting: Cardiovascular Disease

## 2014-11-24 DIAGNOSIS — Z5189 Encounter for other specified aftercare: Secondary | ICD-10-CM | POA: Diagnosis not present

## 2014-11-24 DIAGNOSIS — Z9861 Coronary angioplasty status: Secondary | ICD-10-CM | POA: Diagnosis not present

## 2014-11-24 DIAGNOSIS — I2510 Atherosclerotic heart disease of native coronary artery without angina pectoris: Secondary | ICD-10-CM | POA: Diagnosis not present

## 2014-11-26 ENCOUNTER — Encounter (HOSPITAL_COMMUNITY): Payer: Medicare Other

## 2014-11-29 ENCOUNTER — Encounter (HOSPITAL_COMMUNITY)
Admission: RE | Admit: 2014-11-29 | Discharge: 2014-11-29 | Disposition: A | Discharge status: Home / Self Care | Payer: Medicare Other | Source: Ambulatory Visit | Attending: Cardiovascular Disease | Admitting: Cardiovascular Disease

## 2014-11-29 DIAGNOSIS — Z9861 Coronary angioplasty status: Secondary | ICD-10-CM | POA: Diagnosis not present

## 2014-11-29 DIAGNOSIS — Z5189 Encounter for other specified aftercare: Secondary | ICD-10-CM | POA: Diagnosis not present

## 2014-11-29 DIAGNOSIS — I2510 Atherosclerotic heart disease of native coronary artery without angina pectoris: Secondary | ICD-10-CM | POA: Diagnosis not present

## 2014-12-01 ENCOUNTER — Encounter (HOSPITAL_COMMUNITY)
Admission: RE | Admit: 2014-12-01 | Discharge: 2014-12-01 | Disposition: A | Discharge status: Home / Self Care | Payer: Medicare Other | Source: Ambulatory Visit | Attending: Cardiovascular Disease | Admitting: Cardiovascular Disease

## 2014-12-01 DIAGNOSIS — Z9861 Coronary angioplasty status: Secondary | ICD-10-CM | POA: Diagnosis not present

## 2014-12-01 DIAGNOSIS — I2510 Atherosclerotic heart disease of native coronary artery without angina pectoris: Secondary | ICD-10-CM | POA: Diagnosis not present

## 2014-12-01 DIAGNOSIS — Z5189 Encounter for other specified aftercare: Secondary | ICD-10-CM | POA: Diagnosis not present

## 2014-12-03 ENCOUNTER — Encounter (HOSPITAL_COMMUNITY)
Admission: RE | Admit: 2014-12-03 | Discharge: 2014-12-03 | Disposition: A | Discharge status: Home / Self Care | Payer: Medicare Other | Source: Ambulatory Visit | Attending: Cardiovascular Disease | Admitting: Cardiovascular Disease

## 2014-12-03 DIAGNOSIS — Z5189 Encounter for other specified aftercare: Secondary | ICD-10-CM | POA: Diagnosis not present

## 2014-12-03 DIAGNOSIS — I2510 Atherosclerotic heart disease of native coronary artery without angina pectoris: Secondary | ICD-10-CM | POA: Diagnosis not present

## 2014-12-03 DIAGNOSIS — Z9861 Coronary angioplasty status: Secondary | ICD-10-CM | POA: Diagnosis not present

## 2014-12-03 NOTE — Progress Notes (Signed)
Cardiac Rehabilitation Program Outcomes Report   Orientation:  08/26/14 Graduate Date:  12/03/14 Discharge Date:  12/03/14 # of sessions completed: 36  Cardiologist: Bronson Ing Family MD:  Durward Fortes Time:  0930  A.  Exercise Program:  Tolerates exercise @ 3.58 METS for 15 minutes and Walk Test Results:  Post: 3.32  B.  Mental Health:  Good mental attitude  C.  Education/Instruction/Skills  Accurately checks own pulse.  Rest:  61  Exercise:  98, Knows THR for exercise and Attended 12 education classes  Uses Perceived Exertion Scale and/or Dyspnea Scale  D.  Nutrition/Weight Control/Body Composition:  Adherence to prescribed nutrition program: good    E.  Blood Lipids    Lab Results  Component Value Date   CHOL 137 10/19/2014   HDL 54 10/19/2014   LDLCALC 67 10/19/2014   TRIG 81 10/19/2014   CHOLHDL 2.5 10/19/2014    F.  Lifestyle Changes:  Making positive lifestyle changes/never smoker  G.  Symptoms noted with exercise:  Asymptomatic  Report Completed By:  Stevphen Rochester RN    Comments:  Patient has graduated from CR today.  Patient has done well in the program and is going to join the [facility] with wife.

## 2014-12-03 NOTE — Progress Notes (Signed)
Patient is discharged from [place] and Pulmonary program today December 03, 2014 with 36 sessions.  He achieved LTG of 30 minutes of aerobic exercise at max met level of 3.59.  All patient vitals are WNL.  Patient has met with dietician.  Discharge instructions have been reviewed in detail and patient expressed an understanding of material given.  Patient plans to exercise at home and join the [facility]. Cardiac Rehab will make 1 month, 6 month and 1 year call backs.  Patient had no complaints of any abnormal S/S or pain on their exit visit.  Patient able to finish the post six minute walk test.

## 2014-12-06 ENCOUNTER — Encounter (HOSPITAL_COMMUNITY): Payer: Medicare Other

## 2014-12-08 ENCOUNTER — Encounter (HOSPITAL_COMMUNITY): Payer: Medicare Other

## 2014-12-10 ENCOUNTER — Encounter (HOSPITAL_COMMUNITY): Payer: Medicare Other

## 2015-01-03 DIAGNOSIS — L820 Inflamed seborrheic keratosis: Secondary | ICD-10-CM | POA: Diagnosis not present

## 2015-01-03 DIAGNOSIS — Z1283 Encounter for screening for malignant neoplasm of skin: Secondary | ICD-10-CM | POA: Diagnosis not present

## 2015-01-07 ENCOUNTER — Telehealth: Payer: Self-pay | Admitting: Cardiovascular Disease

## 2015-01-07 MED ORDER — CLOPIDOGREL BISULFATE 75 MG PO TABS: 75.0000 mg | ORAL_TABLET | Freq: Every day | ORAL | Status: DC

## 2015-01-07 NOTE — Telephone Encounter (Signed)
Plavix sent to [facility]. Pt made aware

## 2015-01-10 ENCOUNTER — Telehealth: Payer: Self-pay | Admitting: *Deleted

## 2015-01-10 ENCOUNTER — Telehealth: Payer: Self-pay | Admitting: Cardiovascular Disease

## 2015-01-10 MED ORDER — ATORVASTATIN CALCIUM 10 MG PO TABS: 10.0000 mg | ORAL_TABLET | Freq: Every day | ORAL | Status: DC

## 2015-01-10 NOTE — Telephone Encounter (Signed)
Called and left message on voice mail asking to speak with Dr. Bronson Ing in reference to a medication that was called into pharmacy.

## 2015-01-10 NOTE — Telephone Encounter (Signed)
Refill request from [facility] Lipitor 10 mg daily

## 2015-01-10 NOTE — Telephone Encounter (Signed)
Advised patient that medication was sent to pharmacy this morning at 7:44 am.

## 2015-01-27 ENCOUNTER — Ambulatory Visit (INDEPENDENT_AMBULATORY_CARE_PROVIDER_SITE_OTHER): Payer: Medicare Other | Admitting: Cardiovascular Disease

## 2015-01-27 ENCOUNTER — Encounter: Payer: Self-pay | Admitting: Cardiovascular Disease

## 2015-01-27 VITALS — BP 130/80 | HR 63 | Ht 72.0 in | Wt 193.4 lb

## 2015-01-27 DIAGNOSIS — I10 Essential (primary) hypertension: Secondary | ICD-10-CM

## 2015-01-27 DIAGNOSIS — Z955 Presence of coronary angioplasty implant and graft: Secondary | ICD-10-CM | POA: Diagnosis not present

## 2015-01-27 DIAGNOSIS — E785 Hyperlipidemia, unspecified: Secondary | ICD-10-CM | POA: Diagnosis not present

## 2015-01-27 DIAGNOSIS — I2510 Atherosclerotic heart disease of native coronary artery without angina pectoris: Secondary | ICD-10-CM | POA: Diagnosis not present

## 2015-01-27 MED ORDER — CLOPIDOGREL BISULFATE 75 MG PO TABS: 75.0000 mg | ORAL_TABLET | Freq: Every day | ORAL | Status: DC

## 2015-01-27 MED ORDER — ATORVASTATIN CALCIUM 10 MG PO TABS: 10.0000 mg | ORAL_TABLET | Freq: Every day | ORAL | Status: DC

## 2015-01-27 MED ORDER — METOPROLOL TARTRATE 25 MG PO TABS: 12.5000 mg | ORAL_TABLET | Freq: Two times a day (BID) | ORAL | Status: DC

## 2015-01-27 NOTE — Patient Instructions (Signed)
Your physician wants you to follow-up in: 6 months with Dr. Shawna Orleans. You will receive a reminder letter in the mail two months in advance. If you don't receive a letter, please call our office to schedule the follow-up appointment.  Your physician recommends that you continue on your current medications as directed. Please refer to the Current Medication list given to you today.  I have refilled your Rx with 90 day supply.   Thank you for choosing [facility]!

## 2015-01-27 NOTE — Progress Notes (Signed)
Patient ID: Roger [name], male   DOB: 1946/01/07, 69 y.o.   MRN: [id number]      SUBJECTIVE: The patient presents for followup of CAD. He underwent drug-eluting stent placement to the LAD on 07/16/14. Coronary angiography demonstrated severe disease in the proximal to mid left anterior descending artery. This was successfully treated with a 3.0 x 24 promus drug-eluting stent. There was a large first diagonal which was jailed but continued to have TIMI-3 flow. He also had widely patent left circumflex artery and its branches and a patent RCA.  He completed cardiac rehabilitation. On 10/19/14, total cholesterol 137, triglycerides 81, HDL 54, LDL 67. Liver transaminases were normal.  He has been feeling very well and denies exertional chest pain, exertional dyspnea, leg swelling, palpitations, orthopnea, lightheadedness, dizziness, and bleeding problems. He had some mild postnasal drip when the weather was colder. He denies nosebleeds.  He told me that his sister-in-law, Manus Gunning, is a Cabin crew in [place].   Review of Systems: As per "subjective", otherwise negative.  No Known Allergies  Current Outpatient Prescriptions  Medication Sig Dispense Refill  . acetaminophen (TYLENOL) 500 MG tablet Take 1,000 mg by mouth every 6 (six) hours as needed (pain).    Marland Kitchen aspirin 81 MG tablet Take 81 mg by mouth daily.    Marland Kitchen atorvastatin (LIPITOR) 10 MG tablet Take 1 tablet (10 mg total) by mouth daily at 6 PM. 30 tablet 5  . cetirizine (ZYRTEC) 10 MG tablet Take 10 mg by mouth daily as needed for allergies.    Marland Kitchen clopidogrel (PLAVIX) 75 MG tablet Take 1 tablet (75 mg total) by mouth daily with breakfast. 30 tablet 6  . lisinopril (PRINIVIL,ZESTRIL) 20 MG tablet Take 1 tablet (20 mg total) by mouth daily. 30 tablet 6  . metoprolol tartrate (LOPRESSOR) 25 MG tablet Take 0.5 tablets (12.5 mg total) by mouth 2 (two) times daily. 30 tablet 6  . Multiple Vitamin (MULTIVITAMIN) capsule Take 1 capsule by mouth daily.     . nitroGLYCERIN (NITROSTAT) 0.4 MG SL tablet Place 1 tablet (0.4 mg total) under the tongue every 5 (five) minutes as needed for chest pain. 25 tablet 3   No current facility-administered medications for this visit.    Past Medical History  Diagnosis Date  . Adenomatous polyps   . Seasonal allergies   . Impaired fasting glucose   . Hypertension     Past Surgical History  Procedure Laterality Date  . Colonoscopy  10/03/05    inflamed hyperplastic polyp/adenomatous poylp, left sided diverticula  . Knee arthroscopy Right ~ 1994  . Colonoscopy  03/03/2012    Procedure: COLONOSCOPY;  Surgeon: Daneil Dolin, MD;  [facility];  Service: Endoscopy;  Laterality: N/A;  10:15  . Coronary angioplasty with stent placement  07/16/2014    "1"  . Knee arthroscopy Left ~ 2001  . Left and right heart catheterization with coronary angiogram N/A 07/16/2014    Procedure: LEFT AND RIGHT HEART CATHETERIZATION WITH CORONARY ANGIOGRAM;  Surgeon: [name], MD;  Location: [facility] CATH LAB;  Service: Cardiovascular;  Laterality: N/A;    History   Social History  . Marital Status: Married    Spouse Name: N/A  . Number of Children: N/A  . Years of Education: N/A   Occupational History  . Not on file.   Social History Main Topics  . Smoking status: Never Smoker   . Smokeless tobacco: Never Used  . Alcohol Use: 1.2 oz/week    2  Glasses of wine per week  . Drug Use: No  . Sexual Activity: Yes   Other Topics Concern  . Not on file   Social History Narrative     Filed Vitals:   01/27/15 0905  BP: 130/80  Pulse: 63  Height: 6' (1.829 m)  Weight: 193 lb 6.4 oz (87.726 kg)  SpO2: 99%    PHYSICAL EXAM General: NAD HEENT: Normal. Neck: No JVD, no thyromegaly. Lungs: Clear to auscultation bilaterally with normal respiratory effort. CV: Nondisplaced PMI.  Regular rate and rhythm, normal S1/S2, no S3/S4, no murmur. No pretibial or periankle edema.  No carotid bruit.  Normal  pedal pulses.  Abdomen: Soft, nontender, no hepatosplenomegaly, no distention.  Neurologic: Alert and oriented x 3.  Psych: Normal affect. Skin: Normal. Musculoskeletal: Normal range of motion, no gross deformities. Extremities: No clubbing or cyanosis.   ECG: Most recent ECG reviewed.      ASSESSMENT AND PLAN: 1. CAD s/p LAD DES: Stable ischemic heart disease. He has completed cardiac rehabilitation. Continue aspirin, Plavix, metoprolol and statin therapy. Dual antiplatelet therapy for at least 1 year. 2. Essential HTN: Well controlled with increase of lisinopril to 20 mg daily. No changes. 3. Hyperlipidemia: Results from 10/19/14 noted above. Continue Lipitor 10 mg.  Dispo: f/u 6 months.   Kate Sable, M.D., F.A.C.C.

## 2015-03-03 ENCOUNTER — Other Ambulatory Visit: Payer: Self-pay | Admitting: Cardiovascular Disease

## 2015-03-03 NOTE — Telephone Encounter (Signed)
Received fax refill request  Rx # [id number] Medication:  LISINOPRIL 25MG  TAB Qty 30 Sig:  TAKE ONE TABLEY BY MOUTH ONCE DAILY (DOSE INCREASE 07/27/14) Physician:  Kate Sable

## 2015-03-25 DIAGNOSIS — H43392 Other vitreous opacities, left eye: Secondary | ICD-10-CM | POA: Diagnosis not present

## 2015-03-25 DIAGNOSIS — H52223 Regular astigmatism, bilateral: Secondary | ICD-10-CM | POA: Diagnosis not present

## 2015-03-25 DIAGNOSIS — H524 Presbyopia: Secondary | ICD-10-CM | POA: Diagnosis not present

## 2015-03-25 DIAGNOSIS — H5203 Hypermetropia, bilateral: Secondary | ICD-10-CM | POA: Diagnosis not present

## 2015-08-04 ENCOUNTER — Encounter: Payer: Self-pay | Admitting: Family Medicine

## 2015-08-04 ENCOUNTER — Ambulatory Visit (INDEPENDENT_AMBULATORY_CARE_PROVIDER_SITE_OTHER): Payer: Medicare Other | Admitting: Family Medicine

## 2015-08-04 VITALS — Temp 98.1°F | Ht 72.0 in | Wt 194.1 lb

## 2015-08-04 DIAGNOSIS — R21 Rash and other nonspecific skin eruption: Secondary | ICD-10-CM | POA: Diagnosis not present

## 2015-08-04 DIAGNOSIS — I2510 Atherosclerotic heart disease of native coronary artery without angina pectoris: Secondary | ICD-10-CM

## 2015-08-04 NOTE — Progress Notes (Signed)
   Subjective:    Patient ID: Roger [name], male    DOB: 23-Jul-1946, 69 y.o.   MRN: [id number]  Rash This is a recurrent problem. The affected locations include the face, neck and chest. The rash is characterized by itchiness and redness. It is unknown if there was an exposure to a precipitant. Past treatments include antihistamine (Benadryl, Zyrtec).   Red splotch raised and itchy  Came up last night   [place] and goes  Outdoors playing soccer   After zyrtec and bendadryl, Has had several spells like this in the past. Always occurring after being outdoors. Sudden itchy splotchy patches that come up. Resolved nicely with Benadryl and Zyrtec More itchy  Patient states no other concerns this visit.  Review of Systems  Skin: Positive for rash.   No headache no chest pain no back pain    Objective:   Physical Exam  Alert vitals stable lungs clear heart regular in rhythm HEENT normal skin resolving urticarial patch      Assessment & Plan:  U impression Urticaria, likely environmental induced, sporadic and not a major challenged patient discuss. We'll hold off on allergy workup for now. Plan Zyrtec and Benadryl when necessary. Care discussed WSL

## 2015-08-18 ENCOUNTER — Encounter: Payer: Self-pay | Admitting: Cardiovascular Disease

## 2015-08-18 ENCOUNTER — Ambulatory Visit (INDEPENDENT_AMBULATORY_CARE_PROVIDER_SITE_OTHER): Payer: Medicare Other | Admitting: Cardiovascular Disease

## 2015-08-18 VITALS — BP 128/76 | HR 65 | Ht 72.0 in | Wt 194.0 lb

## 2015-08-18 DIAGNOSIS — E785 Hyperlipidemia, unspecified: Secondary | ICD-10-CM

## 2015-08-18 DIAGNOSIS — I2510 Atherosclerotic heart disease of native coronary artery without angina pectoris: Secondary | ICD-10-CM | POA: Diagnosis not present

## 2015-08-18 DIAGNOSIS — Z955 Presence of coronary angioplasty implant and graft: Secondary | ICD-10-CM | POA: Diagnosis not present

## 2015-08-18 DIAGNOSIS — I10 Essential (primary) hypertension: Secondary | ICD-10-CM | POA: Diagnosis not present

## 2015-08-18 NOTE — Patient Instructions (Signed)
Your physician wants you to follow-up in: 1 year with Dr Koneswaran You will receive a reminder letter in the mail two months in advance. If you don't receive a letter, please call our office to schedule the follow-up appointment.    STOP Plavix   Thank you for choosing [facility] !         

## 2015-08-18 NOTE — Progress Notes (Signed)
Patient ID: Roger [name], male   DOB: [birth year], 69 y.o.   MRN: [id number]      SUBJECTIVE: The patient presents for followup of CAD. He underwent drug-eluting stent placement to the LAD on 07/16/14. Coronary angiography demonstrated severe disease in the proximal to mid left anterior descending artery. This was successfully treated with a 3.0 x 24 promus drug-eluting stent. There was a large first diagonal which was jailed but continued to have TIMI-3 flow. He also had widely patent left circumflex artery and its branches and a patent RCA.  He has been feeling very well and denies exertional chest pain, exertional dyspnea, leg swelling, palpitations, orthopnea, and bleeding problems.   If he stands up too quickly after kneeling down while gardening, he experiences some dizziness.  ECG performed in the office today demonstrates normal sinus rhythm with no ischemic ST segment or T-wave abnormalities, nor any arrhythmias.  Soc: His sister-in-law, Manus Gunning, is a Cabin crew in [place].   Review of Systems: As per "subjective", otherwise negative.  No Known Allergies  Current Outpatient Prescriptions  Medication Sig Dispense Refill  . acetaminophen (TYLENOL) 500 MG tablet Take 1,000 mg by mouth every 6 (six) hours as needed (pain).    Marland Kitchen aspirin 81 MG tablet Take 81 mg by mouth daily.    Marland Kitchen atorvastatin (LIPITOR) 10 MG tablet Take 1 tablet (10 mg total) by mouth daily at 6 PM. 90 tablet 3  . cetirizine (ZYRTEC) 10 MG tablet Take 10 mg by mouth daily as needed for allergies.    Marland Kitchen clopidogrel (PLAVIX) 75 MG tablet Take 1 tablet (75 mg total) by mouth daily with breakfast. 90 tablet 3  . lisinopril (PRINIVIL,ZESTRIL) 20 MG tablet Take 1 tablet (20 mg total) by mouth daily. 30 tablet 6  . metoprolol tartrate (LOPRESSOR) 25 MG tablet Take 0.5 tablets (12.5 mg total) by mouth 2 (two) times daily. 90 tablet 3  . Multiple Vitamin (MULTIVITAMIN) capsule Take 1 capsule by mouth daily.    . nitroGLYCERIN  (NITROSTAT) 0.4 MG SL tablet Place 1 tablet (0.4 mg total) under the tongue every 5 (five) minutes as needed for chest pain. 25 tablet 3   No current facility-administered medications for this visit.    Past Medical History  Diagnosis Date  . Adenomatous polyps   . Seasonal allergies   . Impaired fasting glucose   . Hypertension     Past Surgical History  Procedure Laterality Date  . Colonoscopy  10/03/05    inflamed hyperplastic polyp/adenomatous poylp, left sided diverticula  . Knee arthroscopy Right ~ 1994  . Colonoscopy  03/03/2012    Procedure: COLONOSCOPY;  Surgeon: Daneil Dolin, MD;  Location: [facility];  Service: Endoscopy;  Laterality: N/A;  10:15  . Coronary angioplasty with stent placement  07/16/2014    "1"  . Knee arthroscopy Left ~ 2001  . Left and right heart catheterization with coronary angiogram N/A 07/16/2014    Procedure: LEFT AND RIGHT HEART CATHETERIZATION WITH CORONARY ANGIOGRAM;  Surgeon: [name], MD;  Location: [facility] CATH LAB;  Service: Cardiovascular;  Laterality: N/A;    Social History   Social History  . Marital Status: Married    Spouse Name: N/A  . Number of Children: N/A  . Years of Education: N/A   Occupational History  . Not on file.   Social History Main Topics  . Smoking status: Never Smoker   . Smokeless tobacco: Never Used  . Alcohol Use: 1.2 oz/week  2 Glasses of wine per week  . Drug Use: No  . Sexual Activity: Yes   Other Topics Concern  . Not on file   Social History Narrative     Filed Vitals:   08/18/15 1358  BP: 128/76  Pulse: 65  Height: 6' (1.829 m)  Weight: 194 lb (87.998 kg)  SpO2: 97%    PHYSICAL EXAM General: NAD HEENT: Normal. Neck: No JVD, no thyromegaly. Lungs: Clear to auscultation bilaterally with normal respiratory effort. CV: Nondisplaced PMI.  Regular rate and rhythm, normal S1/S2, no S3/S4, no murmur. No pretibial or periankle edema.  No carotid bruit.  Normal pedal pulses.    Abdomen: Soft, nontender, no hepatosplenomegaly, no distention.  Neurologic: Alert and oriented x 3.  Psych: Normal affect. Skin: Normal. Musculoskeletal: Normal range of motion, no gross deformities. Extremities: No clubbing or cyanosis.   ECG: Most recent ECG reviewed.      ASSESSMENT AND PLAN: 1. CAD s/p LAD DES: Stable ischemic heart disease. Continue aspirin, will stop Plavix, continue metoprolol and statin therapy.   2. Essential HTN: Well controlled lisinopril 20 mg daily. No changes.  3. Hyperlipidemia: Lipids due to be checked in December 2016. Continue Lipitor 10 mg.  Dispo: f/u 1 year.  Kate Sable, M.D., F.A.C.C.

## 2015-08-22 ENCOUNTER — Other Ambulatory Visit: Payer: Self-pay | Admitting: Cardiovascular Disease

## 2015-08-25 ENCOUNTER — Ambulatory Visit (INDEPENDENT_AMBULATORY_CARE_PROVIDER_SITE_OTHER): Payer: Medicare Other | Admitting: *Deleted

## 2015-08-25 DIAGNOSIS — Z23 Encounter for immunization: Secondary | ICD-10-CM | POA: Diagnosis not present

## 2015-10-04 ENCOUNTER — Telehealth: Payer: Self-pay | Admitting: Family Medicine

## 2015-10-04 DIAGNOSIS — Z139 Encounter for screening, unspecified: Secondary | ICD-10-CM

## 2015-10-04 DIAGNOSIS — E785 Hyperlipidemia, unspecified: Secondary | ICD-10-CM

## 2015-10-04 DIAGNOSIS — Z79899 Other long term (current) drug therapy: Secondary | ICD-10-CM

## 2015-10-04 DIAGNOSIS — I10 Essential (primary) hypertension: Secondary | ICD-10-CM

## 2015-10-04 NOTE — Telephone Encounter (Signed)
Called and informed patient per Dr.Steve [place] were ordered for upcoming physical. Informed patient to fast 8 hrs prior to testing. Patient verbalized understanding.

## 2015-10-04 NOTE — Telephone Encounter (Signed)
Same as last  

## 2015-10-04 NOTE — Telephone Encounter (Signed)
bw orders for wellness  Last labs 10/19/14 Lip, Hep, BMP, PSA

## 2015-10-05 ENCOUNTER — Other Ambulatory Visit: Payer: Self-pay | Admitting: *Deleted

## 2015-10-05 DIAGNOSIS — Z125 Encounter for screening for malignant neoplasm of prostate: Secondary | ICD-10-CM

## 2015-10-05 DIAGNOSIS — E785 Hyperlipidemia, unspecified: Secondary | ICD-10-CM

## 2015-10-05 DIAGNOSIS — Z79899 Other long term (current) drug therapy: Secondary | ICD-10-CM

## 2015-10-27 DIAGNOSIS — Z79899 Other long term (current) drug therapy: Secondary | ICD-10-CM | POA: Diagnosis not present

## 2015-10-27 DIAGNOSIS — Z125 Encounter for screening for malignant neoplasm of prostate: Secondary | ICD-10-CM | POA: Diagnosis not present

## 2015-10-27 DIAGNOSIS — E785 Hyperlipidemia, unspecified: Secondary | ICD-10-CM | POA: Diagnosis not present

## 2015-10-27 DIAGNOSIS — Z139 Encounter for screening, unspecified: Secondary | ICD-10-CM | POA: Diagnosis not present

## 2015-10-27 DIAGNOSIS — I10 Essential (primary) hypertension: Secondary | ICD-10-CM | POA: Diagnosis not present

## 2015-10-28 LAB — BASIC METABOLIC PANEL
BUN / CREAT RATIO: 16 (ref 10–22)
BUN: 20 mg/dL (ref 8–27)
CHLORIDE: 103 mmol/L (ref 97–106)
CO2: 24 mmol/L (ref 18–29)
CREATININE: 1.22 mg/dL (ref 0.76–1.27)
Calcium: 9.2 mg/dL (ref 8.6–10.2)
GFR calc Af Amer: 69 mL/min/{1.73_m2} (ref >59)
GFR calc non Af Amer: 60 mL/min/{1.73_m2} (ref >59)
GLUCOSE: 106 mg/dL — AB (ref 65–99)
Potassium: 4.7 mmol/L (ref 3.5–5.2)
SODIUM: 142 mmol/L (ref 136–144)

## 2015-10-28 LAB — HEPATIC FUNCTION PANEL
ALT: 35 IU/L (ref 0–44)
AST: 26 IU/L (ref 0–40)
Albumin: 4.3 g/dL (ref 3.6–4.8)
Alkaline Phosphatase: 70 IU/L (ref 39–117)
BILIRUBIN, DIRECT: 0.13 mg/dL (ref 0.00–0.40)
Bilirubin Total: 0.6 mg/dL (ref 0.0–1.2)
TOTAL PROTEIN: 6.5 g/dL (ref 6.0–8.5)

## 2015-10-28 LAB — LIPID PANEL
CHOL/HDL RATIO: 3.8 ratio (ref 0.0–5.0)
Cholesterol, Total: 196 mg/dL (ref 100–199)
HDL: 51 mg/dL (ref >39)
LDL Calculated: 121 mg/dL — ABNORMAL HIGH (ref 0–99)
Triglycerides: 119 mg/dL (ref 0–149)
VLDL Cholesterol Cal: 24 mg/dL (ref 5–40)

## 2015-10-28 LAB — PSA: PROSTATE SPECIFIC AG, SERUM: 0.4 ng/mL (ref 0.0–4.0)

## 2015-11-03 ENCOUNTER — Ambulatory Visit (INDEPENDENT_AMBULATORY_CARE_PROVIDER_SITE_OTHER): Payer: Medicare Other | Admitting: Family Medicine

## 2015-11-03 ENCOUNTER — Telehealth: Payer: Self-pay | Admitting: Cardiovascular Disease

## 2015-11-03 ENCOUNTER — Encounter: Payer: Self-pay | Admitting: Family Medicine

## 2015-11-03 VITALS — BP 138/80 | Ht 72.0 in | Wt 195.4 lb

## 2015-11-03 DIAGNOSIS — Z0000 Encounter for general adult medical examination without abnormal findings: Secondary | ICD-10-CM | POA: Diagnosis not present

## 2015-11-03 NOTE — Progress Notes (Signed)
Subjective:    Patient ID: Roger [name], male    DOB: 09-21-1946, 69 y.o.   MRN: [id number]  HPI AWV- Annual Wellness Visit  The patient was seen for their annual wellness visit. The patient's past medical history, surgical history, and family history were reviewed. Pertinent vaccines were reviewed ( tetanus, pneumonia, shingles, flu) The patient's medication list was reviewed and updated.  The height and weight were entered. The patient's current BMI is:26.5  Cognitive screening was completed. Outcome of Mini - Cog: pass  Falls within the past 6 months:none  Current tobacco usage: none (All patients who use tobacco were given written and verbal information on quitting)  Recent listing of emergency department/hospitalizations over the past year were reviewed.  current specialist the patient sees on a regular basis: cardiology once a year   Medicare annual wellness visit patient questionnaire was reviewed.  A written screening schedule for the patient for the next 5-10 years was given. Appropriate discussion of followup regarding next visit was discussed.   Results for orders placed or performed in visit on 10/05/15  Lipid panel  Result Value Ref Range   Cholesterol, Total 196 100 - 199 mg/dL   Triglycerides 119 0 - 149 mg/dL   HDL 51 >39 mg/dL   VLDL Cholesterol Cal 24 5 - 40 mg/dL   LDL Calculated 121 (H) 0 - 99 mg/dL   Chol/HDL Ratio 3.8 0.0 - 5.0 ratio units  Hepatic function panel  Result Value Ref Range   Total Protein 6.5 6.0 - 8.5 g/dL   Albumin 4.3 3.6 - 4.8 g/dL   Bilirubin Total 0.6 0.0 - 1.2 mg/dL   Bilirubin, Direct 0.13 0.00 - 0.40 mg/dL   Alkaline Phosphatase 70 39 - 117 IU/L   AST 26 0 - 40 IU/L   ALT 35 0 - 44 IU/L  Basic metabolic panel  Result Value Ref Range   Glucose 106 (H) 65 - 99 mg/dL   BUN 20 8 - 27 mg/dL   Creatinine, Ser 1.22 0.76 - 1.27 mg/dL   GFR calc non Af Amer 60 >59 mL/min/1.73   GFR calc Af Amer 69 >59 mL/min/1.73   BUN/Creatinine Ratio 16 10 - 22   Sodium 142 136 - 144 mmol/L   Potassium 4.7 3.5 - 5.2 mmol/L   Chloride 103 97 - 106 mmol/L   CO2 24 18 - 29 mmol/L   Calcium 9.2 8.6 - 10.2 mg/dL  PSA  Result Value Ref Range   Prostate Specific Ag, Serum 0.4 0.0 - 4.0 ng/mL   Baby aspirin watching and compliant         Review of Systems  Constitutional: Negative for fever, activity change and appetite change.  HENT: Negative for congestion and rhinorrhea.   Eyes: Negative for discharge.  Respiratory: Negative for cough and wheezing.   Cardiovascular: Negative for chest pain.  Gastrointestinal: Negative for vomiting, abdominal pain and blood in stool.  Genitourinary: Negative for frequency and difficulty urinating.  Musculoskeletal: Negative for neck pain.  Skin: Negative for rash.  Allergic/Immunologic: Negative for environmental allergies and food allergies.  Neurological: Negative for weakness and headaches.  Psychiatric/Behavioral: Negative for agitation.  All other systems reviewed and are negative.      Objective:   Physical Exam  Constitutional: He appears well-developed and well-nourished.  HENT:  Head: Normocephalic and atraumatic.  Right Ear: External ear normal.  Left Ear: External ear normal.  Nose: Nose normal.  Mouth/Throat: Oropharynx is clear and moist.  Eyes:  EOM are normal. Pupils are equal, round, and reactive to light.  Neck: Normal range of motion. Neck supple. No thyromegaly present.  Cardiovascular: Normal rate, regular rhythm and normal heart sounds.   No murmur heard. Pulmonary/Chest: Effort normal and breath sounds normal. No respiratory distress. He has no wheezes.  Mild nasal cong, lungs clear  Abdominal: Soft. Bowel sounds are normal. He exhibits no distension and no mass. There is no tenderness.  Genitourinary: Penis normal.  Musculoskeletal: Normal range of motion. He exhibits no edema.  Lymphadenopathy:    He has no cervical adenopathy.    Neurological: He is alert. He exhibits normal muscle tone.  Skin: Skin is warm and dry. No erythema.  Psychiatric: He has a normal mood and affect. His behavior is normal. Judgment normal.  Vitals reviewed.         Assessment & Plan:  Impression 1 wellness exam #2 blood work discussed LDL is up, medications maintain by cardiologist patient will speak cardiologist #3 URI plan diet exercise discussed up-to-date on colonoscopy up-to-date in vaccinations WSL

## 2015-11-03 NOTE — Patient Instructions (Signed)
Results for orders placed or performed in visit on 10/05/15  Lipid panel  Result Value Ref Range   Cholesterol, Total 196 100 - 199 mg/dL   Triglycerides 119 0 - 149 mg/dL   HDL 51 >39 mg/dL   VLDL Cholesterol Cal 24 5 - 40 mg/dL   LDL Calculated 121 (H) 0 - 99 mg/dL   Chol/HDL Ratio 3.8 0.0 - 5.0 ratio units  Hepatic function panel  Result Value Ref Range   Total Protein 6.5 6.0 - 8.5 g/dL   Albumin 4.3 3.6 - 4.8 g/dL   Bilirubin Total 0.6 0.0 - 1.2 mg/dL   Bilirubin, Direct 0.13 0.00 - 0.40 mg/dL   Alkaline Phosphatase 70 39 - 117 IU/L   AST 26 0 - 40 IU/L   ALT 35 0 - 44 IU/L  Basic metabolic panel  Result Value Ref Range   Glucose 106 (H) 65 - 99 mg/dL   BUN 20 8 - 27 mg/dL   Creatinine, Ser 1.22 0.76 - 1.27 mg/dL   GFR calc non Af Amer 60 >59 mL/min/1.73   GFR calc Af Amer 69 >59 mL/min/1.73   BUN/Creatinine Ratio 16 10 - 22   Sodium 142 136 - 144 mmol/L   Potassium 4.7 3.5 - 5.2 mmol/L   Chloride 103 97 - 106 mmol/L   CO2 24 18 - 29 mmol/L   Calcium 9.2 8.6 - 10.2 mg/dL  PSA  Result Value Ref Range   Prostate Specific Ag, Serum 0.4 0.0 - 4.0 ng/mL

## 2015-11-03 NOTE — Telephone Encounter (Signed)
Roger [name] called the [facility] office wanting Dr. Bronson Ing to review his recent labs from Dr. Lance Sell office.

## 2015-11-07 ENCOUNTER — Ambulatory Visit (INDEPENDENT_AMBULATORY_CARE_PROVIDER_SITE_OTHER): Payer: Medicare Other | Admitting: Adult Health

## 2015-11-07 ENCOUNTER — Encounter: Payer: Self-pay | Admitting: Adult Health

## 2015-11-07 VITALS — BP 120/60 | HR 67 | Ht 72.0 in | Wt 197.0 lb

## 2015-11-07 DIAGNOSIS — E7800 Pure hypercholesterolemia, unspecified: Secondary | ICD-10-CM | POA: Diagnosis not present

## 2015-11-07 DIAGNOSIS — E785 Hyperlipidemia, unspecified: Secondary | ICD-10-CM

## 2015-11-07 DIAGNOSIS — I2510 Atherosclerotic heart disease of native coronary artery without angina pectoris: Secondary | ICD-10-CM

## 2015-11-07 IMAGING — CR DG CHEST 2V
2 series · 2 of 2 positions shown · non-contrast
Comparison: None.

CLINICAL DATA: Chest pain and shortness of breath.

EXAM:
CHEST  2 VIEW

[view not recorded (1 of 2)]
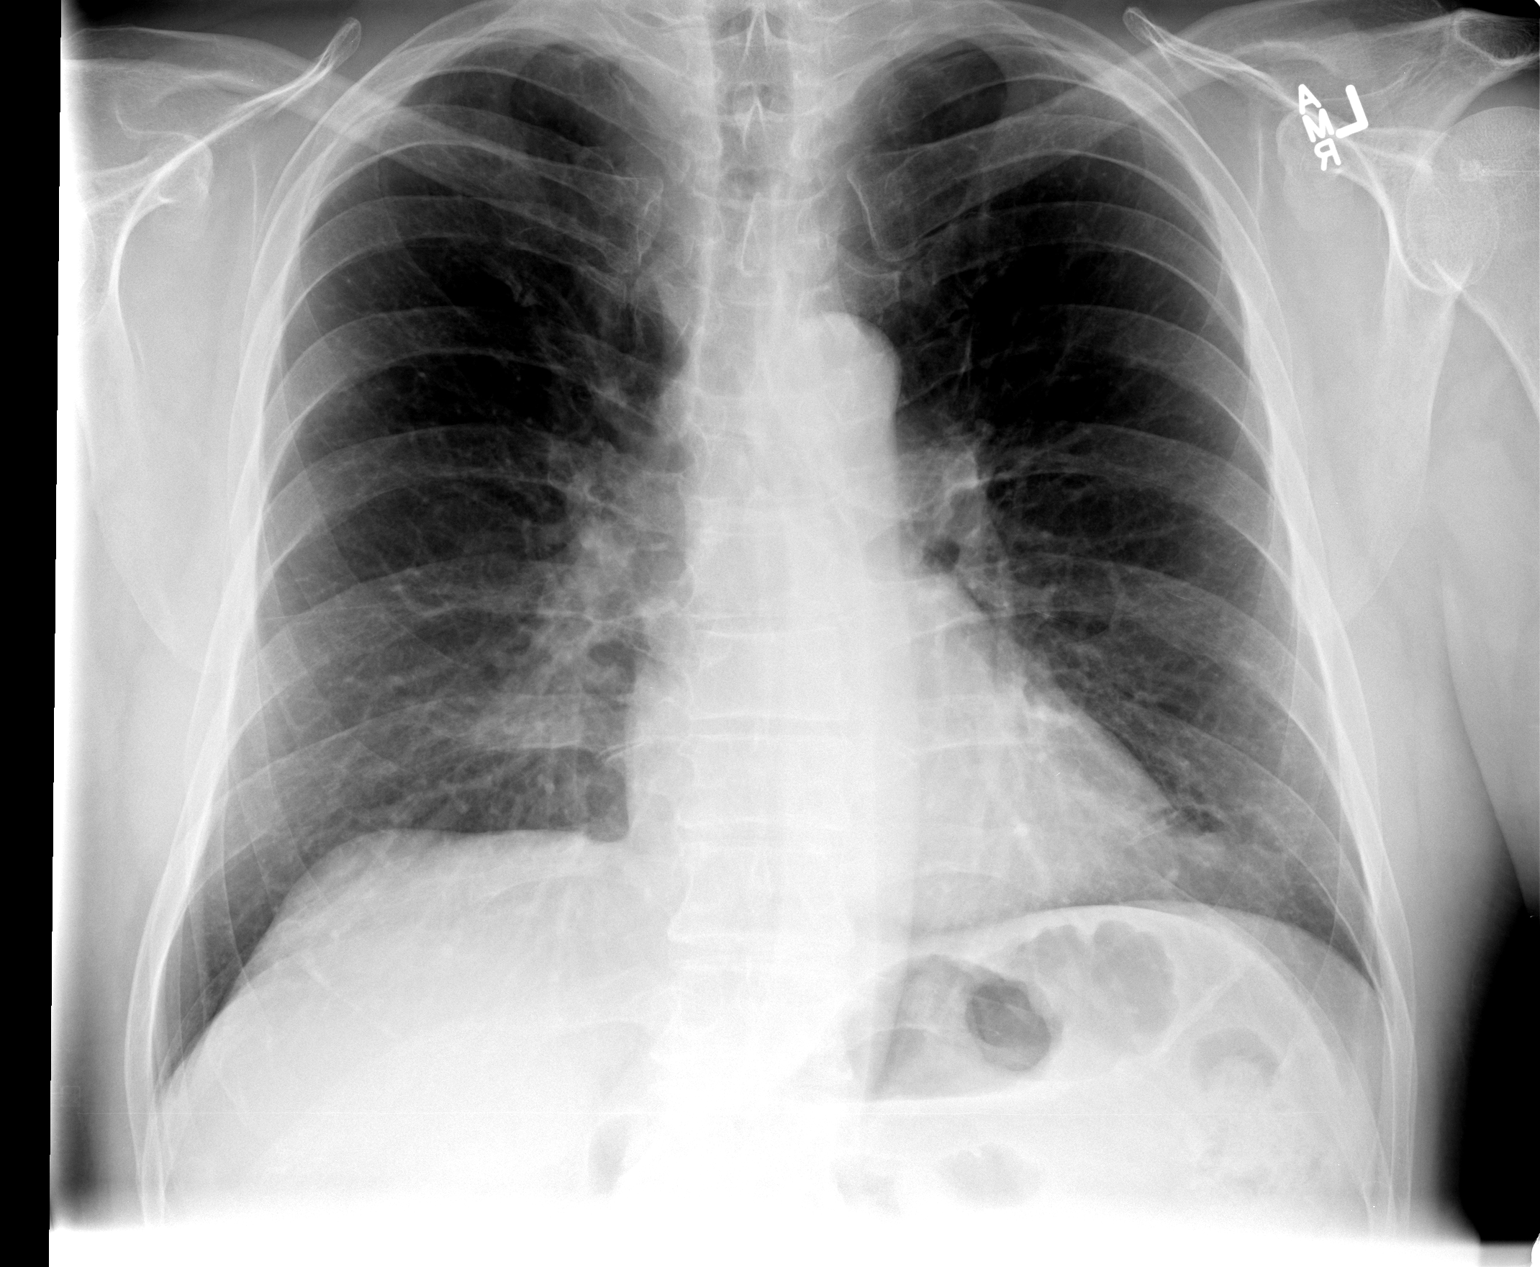

[view not recorded (2 of 2)]
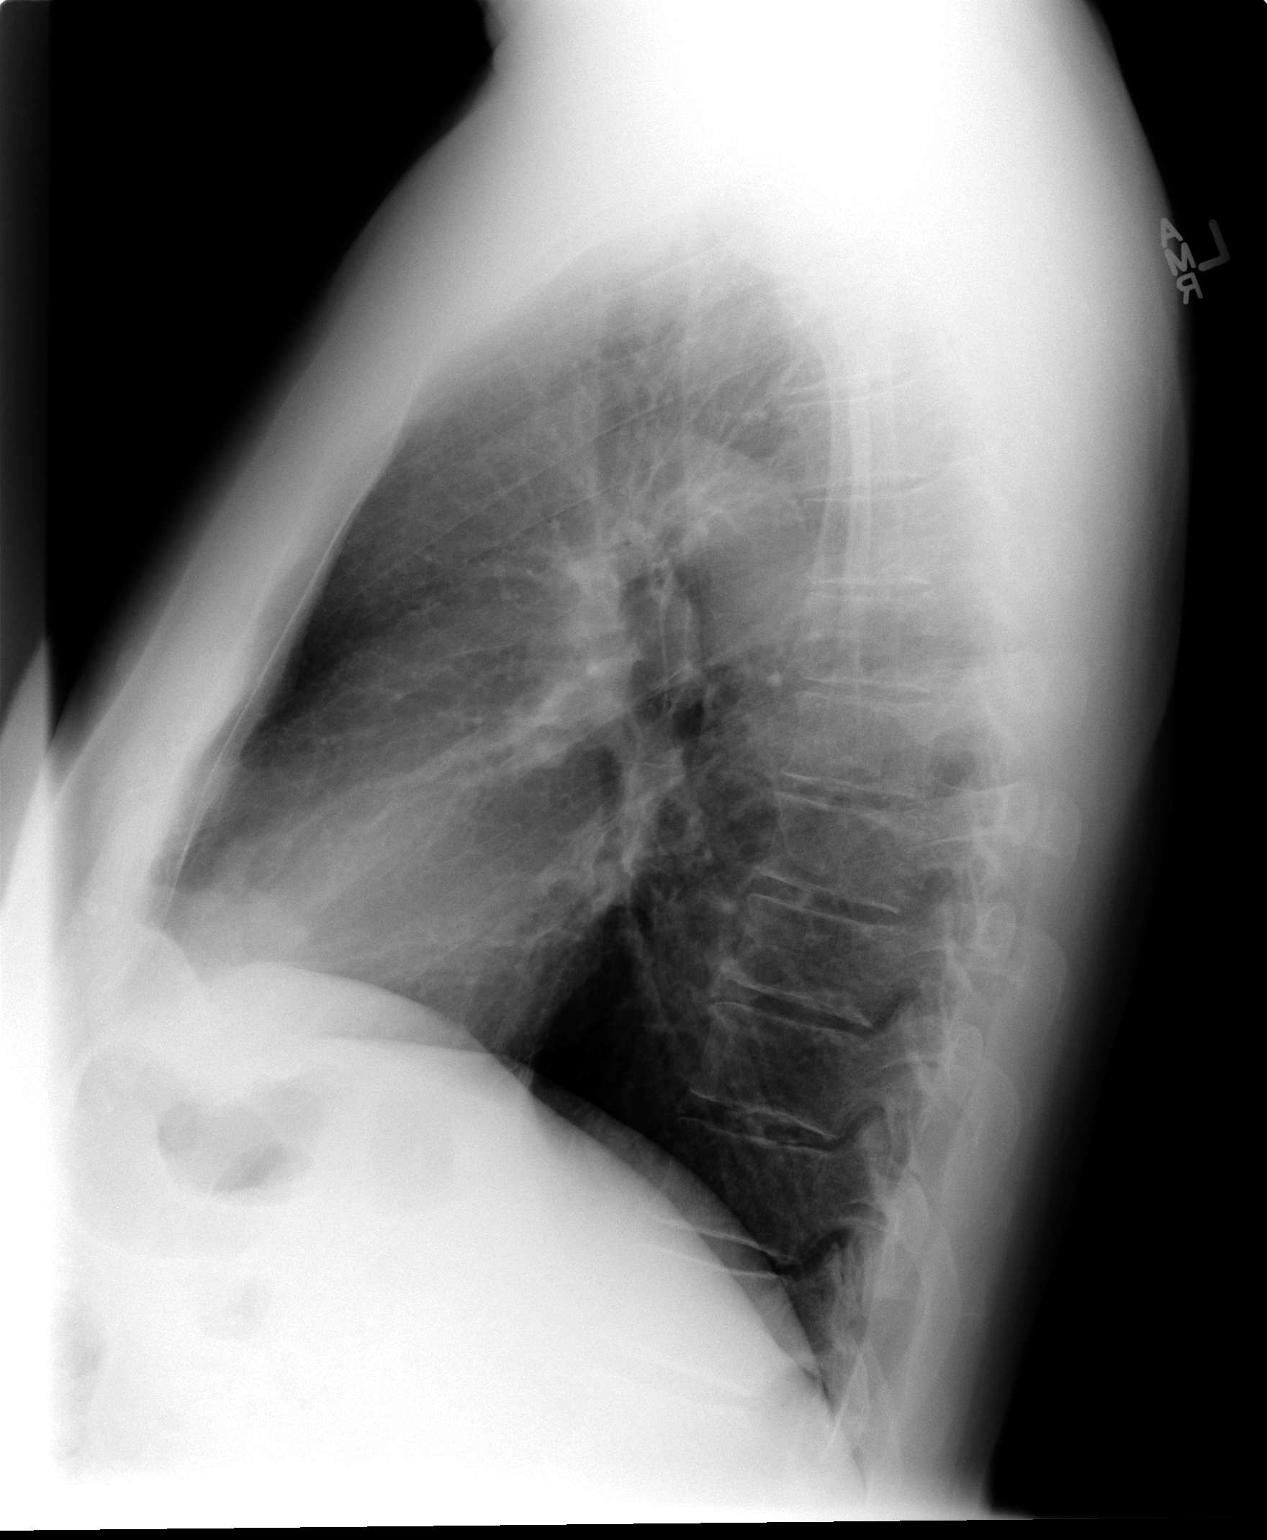

[2 of 2 positions shown; findings below may reference images not displayed]

FINDINGS: Trachea is midline. Heart size normal. Right infrahilar added
density is likely due to superimposition of osseous and vascular
shadows. Lungs are mildly hyperinflated but otherwise clear.
IMPRESSION: No acute findings.

## 2015-11-07 MED ORDER — ATORVASTATIN CALCIUM 20 MG PO TABS: 20.0000 mg | ORAL_TABLET | Freq: Every day | ORAL | Status: DC

## 2015-11-07 NOTE — Progress Notes (Deleted)
Cardiology Office Note   Date:  11/07/2015   ID:  BLONG PIKE, DOB 1946-05-04, MRN [id number]  PCP:  Mickie Hillier, MD  Cardiologist:  Woodroe Chen, NP   No chief complaint on file.     History of Present Illness: Roger [name] is a 69 y.o. male who presents for for followup of CAD. He underwent drug-eluting stent placement to the LAD on 07/16/14. Coronary angiography demonstrated severe disease in the proximal to mid left anterior descending artery. This was successfully treated with a 3.0 x 24 promus drug-eluting stent. There was a large first diagonal which was jailed but continued to have TIMI-3 flow. He also had widely patent left circumflex artery and its branches and a patent RCA.  He was last seen by Dr. Wolfgang Phoenix earlier this month and was found to have elevated LDL of 120 compared to previous levels of 67 on year ago.Total cholesterol has risen from 167 to 197. He remains active and asymptomatic. He walks daily. He denies chest pain or DOE. No muscle aches or pains.    Past Medical History  Diagnosis Date  . Adenomatous polyps   . Seasonal allergies   . Impaired fasting glucose   . Hypertension     Past Surgical History  Procedure Laterality Date  . Colonoscopy  10/03/05    inflamed hyperplastic polyp/adenomatous poylp, left sided diverticula  . Knee arthroscopy Right ~ 1994  . Colonoscopy  03/03/2012    Procedure: COLONOSCOPY;  Surgeon: Daneil Dolin, MD;  Location: [facility];  Service: Endoscopy;  Laterality: N/A;  10:15  . Coronary angioplasty with stent placement  07/16/2014    "1"  . Knee arthroscopy Left ~ 2001  . Left and right heart catheterization with coronary angiogram N/A 07/16/2014    Procedure: LEFT AND RIGHT HEART CATHETERIZATION WITH CORONARY ANGIOGRAM;  Surgeon: [name], MD;  Location: [facility] CATH LAB;  Service: Cardiovascular;  Laterality: N/A;     Current Outpatient Prescriptions  Medication Sig Dispense Refill  .  acetaminophen (TYLENOL) 500 MG tablet Take 1,000 mg by mouth every 6 (six) hours as needed (pain).    Marland Kitchen aspirin 81 MG tablet Take 81 mg by mouth daily.    Marland Kitchen atorvastatin (LIPITOR) 10 MG tablet Take 1 tablet (10 mg total) by mouth daily at 6 PM. 90 tablet 3  . cetirizine (ZYRTEC) 10 MG tablet Take 10 mg by mouth daily as needed for allergies.    Marland Kitchen lisinopril (PRINIVIL,ZESTRIL) 20 MG tablet Take 1 tablet (20 mg total) by mouth daily. 30 tablet 6  . metoprolol tartrate (LOPRESSOR) 25 MG tablet Take 0.5 tablets (12.5 mg total) by mouth 2 (two) times daily. 90 tablet 3  . Multiple Vitamin (MULTIVITAMIN) capsule Take 1 capsule by mouth daily.    Marland Kitchen NITROSTAT 0.4 MG SL tablet PLACE 1 TABLET UNDER THE TONGUE EVERY 5 MINUTES AS NEEDED FOR CHEST PAIN 25 tablet 3   No current facility-administered medications for this visit.    Allergies:   Review of patient's allergies indicates no known allergies.    Social History:  The patient  reports that he has never smoked. He has never used smokeless tobacco. He reports that he drinks about 1.2 oz of alcohol per week. He reports that he does not use illicit drugs.   Family History:  The patient's family history includes Cancer in his father; Liver cancer in his father. There is no history of Colon cancer.    ROS: All other  systems are reviewed and negative. Unless otherwise mentioned in H&P    PHYSICAL EXAM: VS:  BP 120/60 mmHg  Pulse 67  Ht 6' (1.829 m)  Wt 197 lb (89.359 kg)  BMI 26.71 kg/m2  SpO2 99% , BMI Body mass index is 26.71 kg/(m^2). GEN: Well nourished, well developed, in no acute distress HEENT: normal Neck: no JVD, carotid bruits, or masses Cardiac: RRR; no murmurs, rubs, or gallops,no edema  Respiratory:  clear to auscultation bilaterally, normal work of breathing GI: soft, nontender, nondistended, + BS MS: no deformity or atrophy Skin: warm and dry, no rash Neuro:  Strength and sensation are intact Psych: euthymic mood, full  affect   Recent Labs: 10/27/2015: ALT 35; BUN 20; Creatinine, Ser 1.22; Potassium 4.7; Sodium 142    Lipid Panel    Component Value Date/Time   CHOL 196 10/27/2015 0812   CHOL 137 10/19/2014 0743   TRIG 119 10/27/2015 0812   HDL 51 10/27/2015 0812   HDL 54 10/19/2014 0743   CHOLHDL 3.8 10/27/2015 0812   CHOLHDL 2.5 10/19/2014 0743   VLDL 16 10/19/2014 0743   LDLCALC 121* 10/27/2015 0812   LDLCALC 67 10/19/2014 0743      Wt Readings from Last 3 Encounters:  11/07/15 197 lb (89.359 kg)  11/03/15 195 lb 6.4 oz (88.633 kg)  08/18/15 194 lb (87.998 kg)     ASSESSMENT AND PLAN:  1. CAD: No complaints of fatigue, chest pain or DOE. He remains active. Continue metoprolol as directed along with ASA and ACE.   2. Hypercholesterolemia: LDL is above recommended levels of 70 for CAD patient. I will increase lipitor to 20 mg and he is advised to take it at HS instead of in the am. Will repeat his L/L studies in 6 weeks. Copy to Dr. Wolfgang Phoenix. He is advised on low cholesterol diet.   3. Hypertension: BP is well controlled. Review of labs reveals good kidney fx.    Current medicines are reviewed at length with the patient today.    Labs/ tests ordered today include: Fasting lipids and LFT's in 6 weeks.  No orders of the defined types were placed in this encounter.     Disposition:   FU with 3 months.   Signed, Jory Sims, NP  11/07/2015 2:06 PM    [facility] [address] [place], [place] [address] Phone: [phone]; Fax: [phone]

## 2015-11-07 NOTE — Progress Notes (Signed)
Name: VIREN SCHENONE    DOB: [birth year]  Age: 69 y.o.  MR#: [id number]       PCP:  Mickie Hillier, MD      Insurance: Payor: MEDICARE / Plan: MEDICARE PART A AND B / Product Type: *No Product type* /   CC:   No chief complaint on file.   VS Filed Vitals:   11/07/15 1358  BP: 120/60  Pulse: 67  Height: 6' (1.829 m)  Weight: 197 lb (89.359 kg)  SpO2: 99%    Weights Current Weight  11/07/15 197 lb (89.359 kg)  11/03/15 195 lb 6.4 oz (88.633 kg)  08/18/15 194 lb (87.998 kg)    Blood Pressure  BP Readings from Last 3 Encounters:  11/07/15 120/60  11/03/15 138/80  08/18/15 128/76     Admit date:  (Not on file) Last encounter with RMR:  Visit date not found   Allergy Review of patient's allergies indicates no known allergies.  Current Outpatient Prescriptions  Medication Sig Dispense Refill  . acetaminophen (TYLENOL) 500 MG tablet Take 1,000 mg by mouth every 6 (six) hours as needed (pain).    Marland Kitchen aspirin 81 MG tablet Take 81 mg by mouth daily.    Marland Kitchen atorvastatin (LIPITOR) 10 MG tablet Take 1 tablet (10 mg total) by mouth daily at 6 PM. 90 tablet 3  . cetirizine (ZYRTEC) 10 MG tablet Take 10 mg by mouth daily as needed for allergies.    Marland Kitchen lisinopril (PRINIVIL,ZESTRIL) 20 MG tablet Take 1 tablet (20 mg total) by mouth daily. 30 tablet 6  . metoprolol tartrate (LOPRESSOR) 25 MG tablet Take 0.5 tablets (12.5 mg total) by mouth 2 (two) times daily. 90 tablet 3  . Multiple Vitamin (MULTIVITAMIN) capsule Take 1 capsule by mouth daily.    Marland Kitchen NITROSTAT 0.4 MG SL tablet PLACE 1 TABLET UNDER THE TONGUE EVERY 5 MINUTES AS NEEDED FOR CHEST PAIN 25 tablet 3   No current facility-administered medications for this visit.    Discontinued Meds:   There are no discontinued medications.  Patient Active Problem List   Diagnosis Date Noted  . Abnormal stress test 07/17/2014  . HTN (hypertension) 07/17/2014  . HLD (hyperlipidemia)- LDL not at goal of <70. 07/17/2014  . CAD- s/p PCI + DES to mid  LAD 07/16/14 07/17/2014  . Other and unspecified angina pectoris 07/16/2014  . Chest pain 07/05/2014  . Adenomatous polyps 01/30/2012    LABS    Component Value Date/Time   NA 142 10/27/2015 0812   NA 139 10/19/2014 0743   NA 142 07/17/2014 0427   NA 141 07/14/2014 0716   K 4.7 10/27/2015 0812   K 4.4 10/19/2014 0743   K 4.1 07/17/2014 0427   CL 103 10/27/2015 0812   CL 105 10/19/2014 0743   CL 105 07/17/2014 0427   CO2 24 10/27/2015 0812   CO2 30 10/19/2014 0743   CO2 26 07/17/2014 0427   GLUCOSE 106* 10/27/2015 0812   GLUCOSE 101* 10/19/2014 0743   GLUCOSE 104* 07/17/2014 0427   GLUCOSE 100* 07/14/2014 0716   BUN 20 10/27/2015 0812   BUN 23 10/19/2014 0743   BUN 15 07/17/2014 0427   BUN 18 07/14/2014 0716   CREATININE 1.22 10/27/2015 0812   CREATININE 1.30 10/19/2014 0743   CREATININE 1.05 07/17/2014 0427   CREATININE 1.10 07/14/2014 0716   CREATININE 1.09 10/08/2013 0714   CALCIUM 9.2 10/27/2015 0812   CALCIUM 8.9 10/19/2014 0743   CALCIUM 8.8 07/17/2014 0427  GFRNONAA 60 10/27/2015 0812   GFRNONAA 71* 07/17/2014 0427   GFRAA 69 10/27/2015 0812   GFRAA 82* 07/17/2014 0427   CMP     Component Value Date/Time   NA 142 10/27/2015 0812   NA 139 10/19/2014 0743   K 4.7 10/27/2015 0812   CL 103 10/27/2015 0812   CO2 24 10/27/2015 0812   GLUCOSE 106* 10/27/2015 0812   GLUCOSE 101* 10/19/2014 0743   BUN 20 10/27/2015 0812   BUN 23 10/19/2014 0743   CREATININE 1.22 10/27/2015 0812   CREATININE 1.30 10/19/2014 0743   CALCIUM 9.2 10/27/2015 0812   PROT 6.5 10/27/2015 0812   PROT 6.4 10/19/2014 0743   ALBUMIN 4.3 10/27/2015 0812   ALBUMIN 4.1 10/19/2014 0743   AST 26 10/27/2015 0812   ALT 35 10/27/2015 0812   ALKPHOS 70 10/27/2015 0812   BILITOT 0.6 10/27/2015 0812   BILITOT 0.9 10/19/2014 0743   GFRNONAA 60 10/27/2015 0812   GFRAA 69 10/27/2015 0812       Component Value Date/Time   WBC 6.8 07/17/2014 0427   WBC 5.4 07/14/2014 0716   HGB 14.8  07/17/2014 0427   HGB 15.5 07/14/2014 0716   HCT 42.8 07/17/2014 0427   HCT 45.0 07/14/2014 0716   MCV 86.3 07/17/2014 0427   MCV 86.5 07/14/2014 0716    Lipid Panel     Component Value Date/Time   CHOL 196 10/27/2015 0812   CHOL 137 10/19/2014 0743   TRIG 119 10/27/2015 0812   HDL 51 10/27/2015 0812   HDL 54 10/19/2014 0743   CHOLHDL 3.8 10/27/2015 0812   CHOLHDL 2.5 10/19/2014 0743   VLDL 16 10/19/2014 0743   LDLCALC 121* 10/27/2015 0812   LDLCALC 67 10/19/2014 0743    ABG No results found for: PHART, PCO2ART, PO2ART, HCO3, TCO2, ACIDBASEDEF, O2SAT   No results found for: TSH BNP (last 3 results) No results for input(s): BNP in the last 8760 hours.  ProBNP (last 3 results) No results for input(s): PROBNP in the last 8760 hours.  Cardiac Panel (last 3 results) No results for input(s): CKTOTAL, CKMB, TROPONINI, RELINDX in the last 72 hours.  Iron/TIBC/Ferritin/ %Sat No results found for: IRON, TIBC, FERRITIN, IRONPCTSAT   EKG Orders placed or performed in visit on 08/18/15  . EKG 12-Lead     Prior Assessment and Plan Problem List as of 11/07/2015      Cardiovascular and Mediastinum   HTN (hypertension)   CAD- s/p PCI + DES to mid LAD 07/16/14   Other and unspecified angina pectoris     Digestive   Adenomatous polyps   Last Assessment & Plan 01/30/2012 Office Visit Written 01/30/2012  4:43 PM by Orvil Feil, NP    69 year old male with hx of adenomatous rectal polyp in 2006. Due for surveillance now. He is completely void of any lower or upper GI symptoms. We will proceed with a colonoscopy in very near future.  Proceed with TCS with Dr. Gala Romney in near future: the risks, benefits, and alternatives have been discussed with the patient in detail. The patient states understanding and desires to proceed.         Other   HLD (hyperlipidemia)- LDL not at goal of <70.   Chest pain   Abnormal stress test       Imaging: No results found.

## 2015-11-07 NOTE — Patient Instructions (Signed)
Your physician recommends that you schedule a follow-up appointment in: 3 Months with Dr. Bronson Ing  Your physician has recommended you make the following change in your medication:   Increase Lipitor to 20 mg Daily (Please take at night)  Your physician recommends that you return for lab work in: 6 weeks 12/19/15  If you need a refill on your cardiac medications before your next appointment, please call your pharmacy.  Thank you for choosing [facility]!

## 2015-11-10 ENCOUNTER — Telehealth: Payer: Self-pay | Admitting: Family Medicine

## 2015-11-10 MED ORDER — AMOXICILLIN 500 MG PO CAPS: 500.0000 mg | ORAL_CAPSULE | Freq: Three times a day (TID) | ORAL | Status: DC

## 2015-11-10 NOTE — Telephone Encounter (Signed)
Med sent to pharmacy. [place] desk notified wife.

## 2015-11-10 NOTE — Telephone Encounter (Signed)
amox 500 tid ten d 

## 2015-11-10 NOTE — Telephone Encounter (Signed)
Patient states that he still has head congestion, sore throat and a hacking, non productive cough. Treatments tried: flonase and zyrtec with no relief. Patient states he was told by Dr. Richardson Landry to call back if his symptoms persist.

## 2015-11-10 NOTE — Telephone Encounter (Signed)
Pt called stating that he still has a hacking cough and is real congested and was told if he didn't feel better to call. Please advise.     [facility] [place]

## 2015-11-30 ENCOUNTER — Telehealth: Payer: Self-pay | Admitting: Cardiovascular Disease

## 2015-11-30 NOTE — Telephone Encounter (Signed)
Spoke to pt and he states that he had never stopped the plavix that he was told on his 08/04/2015 visit, and had not been taking his lipitor as directed either. He just realized he was suppose to increase his lipitor dose to 20 mg every night starting 11/07/2015. He stated that he will start taking the correct doses today. He just wanted to let us know.

## 2015-11-30 NOTE — Telephone Encounter (Signed)
Pt just realized that he's been taking the wrong medication since seeing Dr. Bronson Ing in September, could someone please give him a call

## 2015-12-02 ENCOUNTER — Telehealth: Payer: Self-pay | Admitting: Family Medicine

## 2015-12-02 MED ORDER — CEFPROZIL 500 MG PO TABS: ORAL_TABLET | ORAL | Status: DC

## 2015-12-02 NOTE — Telephone Encounter (Signed)
Patient was given amoxil 500 mg TID for 10 days on 11/10/15. Patient states that he still has a hacking cough, runny nose and sinus pressure. No fever, sob or wheezing noted. [facility] [place]

## 2015-12-02 NOTE — Telephone Encounter (Signed)
cefzil 500 bid ten d 

## 2015-12-02 NOTE — Telephone Encounter (Signed)
Pt called stating that he has finished the amox that he was prescribed and is still not better. Pt would like another antibiotic to be called in if possible.     [facility] [place]

## 2015-12-02 NOTE — Telephone Encounter (Signed)
Called patient and informed him per Dr.Steve Luking- Cefzil 500 1 tablet twice a day for 10 days was sent into [place]. Patient verbalized understanding.

## 2016-01-27 DIAGNOSIS — E785 Hyperlipidemia, unspecified: Secondary | ICD-10-CM | POA: Diagnosis not present

## 2016-01-28 LAB — LIPID PANEL
CHOL/HDL RATIO: 3.1 ratio (ref <=5.0)
CHOLESTEROL: 150 mg/dL (ref 125–200)
HDL: 48 mg/dL (ref >=40)
LDL CALC: 81 mg/dL (ref <130)
TRIGLYCERIDES: 107 mg/dL (ref <150)
VLDL: 21 mg/dL (ref <30)

## 2016-01-28 LAB — HEPATIC FUNCTION PANEL
ALT: 34 U/L (ref 9–46)
AST: 30 U/L (ref 10–35)
Albumin: 4.2 g/dL (ref 3.6–5.1)
Alkaline Phosphatase: 70 U/L (ref 40–115)
BILIRUBIN DIRECT: 0.1 mg/dL (ref <=0.2)
BILIRUBIN INDIRECT: 0.5 mg/dL (ref 0.2–1.2)
BILIRUBIN TOTAL: 0.6 mg/dL (ref 0.2–1.2)
Total Protein: 6.9 g/dL (ref 6.1–8.1)

## 2016-01-30 ENCOUNTER — Encounter: Payer: Self-pay | Admitting: *Deleted

## 2016-01-31 ENCOUNTER — Other Ambulatory Visit: Payer: Self-pay | Admitting: Cardiovascular Disease

## 2016-02-08 ENCOUNTER — Ambulatory Visit (INDEPENDENT_AMBULATORY_CARE_PROVIDER_SITE_OTHER): Payer: Medicare Other | Admitting: Cardiovascular Disease

## 2016-02-08 ENCOUNTER — Encounter: Payer: Self-pay | Admitting: Cardiovascular Disease

## 2016-02-08 VITALS — BP 112/64 | HR 61 | Ht 72.0 in | Wt 197.0 lb

## 2016-02-08 DIAGNOSIS — I10 Essential (primary) hypertension: Secondary | ICD-10-CM

## 2016-02-08 DIAGNOSIS — I2510 Atherosclerotic heart disease of native coronary artery without angina pectoris: Secondary | ICD-10-CM

## 2016-02-08 DIAGNOSIS — E785 Hyperlipidemia, unspecified: Secondary | ICD-10-CM | POA: Diagnosis not present

## 2016-02-08 NOTE — Progress Notes (Signed)
Patient ID: Roger [name], male   DOB: 1946/04/22, 70 y.o.   MRN: [id number]      SUBJECTIVE: The patient presents for followup of CAD. He underwent drug-eluting stent placement to the LAD on 07/16/14. Coronary angiography demonstrated severe disease in the proximal to mid left anterior descending artery. This was successfully treated with a 3.0 x 24 promus drug-eluting stent. There was a large first diagonal which was jailed but continued to have TIMI-3 flow. He also had widely patent left circumflex artery and its branches and a patent RCA.  The patient denies any symptoms of chest pain, palpitations, shortness of breath, lightheadedness, dizziness, leg swelling, orthopnea, PND, and syncope.  When I saw him in September 2016 and instructed him to stop Plavix, he stopped taking Lipitor and took two Plavix tablets daily.  Soc: His sister-in-law, Manus Gunning, is a Cabin crew in [place].   Review of Systems: As per "subjective", otherwise negative.  No Known Allergies  Current Outpatient Prescriptions  Medication Sig Dispense Refill  . acetaminophen (TYLENOL) 500 MG tablet Take 1,000 mg by mouth every 6 (six) hours as needed (pain).    Marland Kitchen amoxicillin (AMOXIL) 500 MG capsule Take 1 capsule (500 mg total) by mouth 3 (three) times daily. For 10 days 30 capsule 0  . aspirin 81 MG tablet Take 81 mg by mouth daily.    Marland Kitchen atorvastatin (LIPITOR) 20 MG tablet Take 1 tablet (20 mg total) by mouth daily at 6 PM. 90 tablet 3  . cefPROZIL (CEFZIL) 500 MG tablet Take 1 tablet by mouth twice a day for 10 days. 20 tablet 0  . cetirizine (ZYRTEC) 10 MG tablet Take 10 mg by mouth daily as needed for allergies.    Marland Kitchen lisinopril (PRINIVIL,ZESTRIL) 20 MG tablet Take 1 tablet (20 mg total) by mouth daily. 30 tablet 6  . metoprolol tartrate (LOPRESSOR) 25 MG tablet TAKE ONE-HALF TABLET BY MOUTH TWICE DAILY 90 tablet 1  . Multiple Vitamin (MULTIVITAMIN) capsule Take 1 capsule by mouth daily.    Marland Kitchen NITROSTAT 0.4 MG SL tablet  PLACE 1 TABLET UNDER THE TONGUE EVERY 5 MINUTES AS NEEDED FOR CHEST PAIN 25 tablet 3   No current facility-administered medications for this visit.    Past Medical History  Diagnosis Date  . Adenomatous polyps   . Seasonal allergies   . Impaired fasting glucose   . Hypertension     Past Surgical History  Procedure Laterality Date  . Colonoscopy  10/03/05    inflamed hyperplastic polyp/adenomatous poylp, left sided diverticula  . Knee arthroscopy Right ~ 1994  . Colonoscopy  03/03/2012    Procedure: COLONOSCOPY;  Surgeon: Daneil Dolin, MD;  [facility];  Service: Endoscopy;  Laterality: N/A;  10:15  . Coronary angioplasty with stent placement  07/16/2014    "1"  . Knee arthroscopy Left ~ 2001  . Left and right heart catheterization with coronary angiogram N/A 07/16/2014    Procedure: LEFT AND RIGHT HEART CATHETERIZATION WITH CORONARY ANGIOGRAM;  Surgeon: [name], MD;  Location: [facility] CATH LAB;  Service: Cardiovascular;  Laterality: N/A;    Social History   Social History  . Marital Status: Married    Spouse Name: N/A  . Number of Children: N/A  . Years of Education: N/A   Occupational History  . Not on file.   Social History Main Topics  . Smoking status: Never Smoker   . Smokeless tobacco: Never Used  . Alcohol Use: 1.2 oz/week  2 Glasses of wine per week  . Drug Use: No  . Sexual Activity: Yes   Other Topics Concern  . Not on file   Social History Narrative     Filed Vitals:   02/08/16 0820  BP: 112/64  Pulse: 61  Height: 6' (1.829 m)  Weight: 197 lb (89.359 kg)  SpO2: 99%    PHYSICAL EXAM General: NAD HEENT: Normal. Neck: No JVD, no thyromegaly. Lungs: Clear to auscultation bilaterally with normal respiratory effort. CV: Nondisplaced PMI.  Regular rate and rhythm, normal S1/S2, no S3/S4, no murmur. No pretibial or periankle edema.  No carotid bruit.   Abdomen: Soft, nontender, no distention.  Neurologic: Alert and oriented.    Psych: Normal affect. Skin: Normal. Musculoskeletal: No gross deformities.  ECG: Most recent ECG reviewed.      ASSESSMENT AND PLAN: 1. CAD s/p LAD DES: Stable ischemic heart disease. Continue aspirin, metoprolol, and statin therapy.   2. Essential HTN: Well controlled on lisinopril 20 mg daily. No changes.  3. Hyperlipidemia: LDL 81 on 01/27/16. Continue Lipitor 20 mg.  Dispo: f/u 1 year.   Kate Sable, M.D., F.A.C.C.

## 2016-02-08 NOTE — Patient Instructions (Signed)
Medication Instructions:  Your physician recommends that you continue on your current medications as directed. Please refer to the Current Medication list given to you today.   Labwork: NONE  Testing/Procedures: NONE  Follow-Up: Your physician wants you to follow-up in: 1 YEAR .  You will receive a reminder letter in the mail two months in advance. If you don't receive a letter, please call our office to schedule the follow-up appointment.   Any Other Special Instructions Will Be Listed Below (If Applicable).  Thanks for choosing [facility]!!!     If you need a refill on your cardiac medications before your next appointment, please call your pharmacy.

## 2016-02-20 ENCOUNTER — Other Ambulatory Visit: Payer: Self-pay

## 2016-02-20 MED ORDER — LISINOPRIL 20 MG PO TABS: 20.0000 mg | ORAL_TABLET | Freq: Every day | ORAL | Status: DC

## 2016-02-20 NOTE — Telephone Encounter (Signed)
Refill complete 

## 2016-04-05 DIAGNOSIS — D485 Neoplasm of uncertain behavior of skin: Secondary | ICD-10-CM | POA: Diagnosis not present

## 2016-04-05 DIAGNOSIS — Z1283 Encounter for screening for malignant neoplasm of skin: Secondary | ICD-10-CM | POA: Diagnosis not present

## 2016-04-05 DIAGNOSIS — L570 Actinic keratosis: Secondary | ICD-10-CM | POA: Diagnosis not present

## 2016-04-05 DIAGNOSIS — L814 Other melanin hyperpigmentation: Secondary | ICD-10-CM | POA: Diagnosis not present

## 2016-04-05 DIAGNOSIS — X32XXXD Exposure to sunlight, subsequent encounter: Secondary | ICD-10-CM | POA: Diagnosis not present

## 2016-04-05 DIAGNOSIS — D225 Melanocytic nevi of trunk: Secondary | ICD-10-CM | POA: Diagnosis not present

## 2016-04-23 DIAGNOSIS — L905 Scar conditions and fibrosis of skin: Secondary | ICD-10-CM | POA: Diagnosis not present

## 2016-04-23 DIAGNOSIS — D485 Neoplasm of uncertain behavior of skin: Secondary | ICD-10-CM | POA: Diagnosis not present

## 2016-07-14 DIAGNOSIS — T63441A Toxic effect of venom of bees, accidental (unintentional), initial encounter: Secondary | ICD-10-CM | POA: Diagnosis not present

## 2016-07-23 ENCOUNTER — Other Ambulatory Visit: Payer: Self-pay | Admitting: Cardiovascular Disease

## 2016-08-14 DIAGNOSIS — H1131 Conjunctival hemorrhage, right eye: Secondary | ICD-10-CM | POA: Diagnosis not present

## 2016-08-15 ENCOUNTER — Other Ambulatory Visit: Payer: Self-pay | Admitting: Cardiovascular Disease

## 2016-08-15 DIAGNOSIS — Z23 Encounter for immunization: Secondary | ICD-10-CM | POA: Diagnosis not present

## 2016-09-18 DIAGNOSIS — H524 Presbyopia: Secondary | ICD-10-CM | POA: Diagnosis not present

## 2016-09-18 DIAGNOSIS — H43812 Vitreous degeneration, left eye: Secondary | ICD-10-CM | POA: Diagnosis not present

## 2016-09-18 DIAGNOSIS — H52223 Regular astigmatism, bilateral: Secondary | ICD-10-CM | POA: Diagnosis not present

## 2016-09-18 DIAGNOSIS — H5203 Hypermetropia, bilateral: Secondary | ICD-10-CM | POA: Diagnosis not present

## 2016-09-24 ENCOUNTER — Other Ambulatory Visit: Payer: Self-pay | Admitting: Cardiovascular Disease

## 2016-09-25 ENCOUNTER — Telehealth: Payer: Self-pay | Admitting: Family Medicine

## 2016-09-25 DIAGNOSIS — E785 Hyperlipidemia, unspecified: Secondary | ICD-10-CM

## 2016-09-25 DIAGNOSIS — Z125 Encounter for screening for malignant neoplasm of prostate: Secondary | ICD-10-CM

## 2016-09-25 DIAGNOSIS — I10 Essential (primary) hypertension: Secondary | ICD-10-CM

## 2016-09-25 NOTE — Telephone Encounter (Signed)
Requesting order for bloodwork for appointment in December.

## 2016-09-25 NOTE — Telephone Encounter (Signed)
Lip liv m7 psa 

## 2016-09-26 NOTE — Telephone Encounter (Signed)
Blood work ordered in EPIC. Patient notified. 

## 2016-10-26 ENCOUNTER — Other Ambulatory Visit: Payer: Self-pay

## 2016-11-02 DIAGNOSIS — E785 Hyperlipidemia, unspecified: Secondary | ICD-10-CM | POA: Diagnosis not present

## 2016-11-02 DIAGNOSIS — Z125 Encounter for screening for malignant neoplasm of prostate: Secondary | ICD-10-CM | POA: Diagnosis not present

## 2016-11-02 DIAGNOSIS — I10 Essential (primary) hypertension: Secondary | ICD-10-CM | POA: Diagnosis not present

## 2016-11-03 LAB — LIPID PANEL
CHOL/HDL RATIO: 2.7 ratio (ref 0.0–5.0)
Cholesterol, Total: 138 mg/dL (ref 100–199)
HDL: 51 mg/dL (ref >39)
LDL Calculated: 69 mg/dL (ref 0–99)
TRIGLYCERIDES: 89 mg/dL (ref 0–149)
VLDL CHOLESTEROL CAL: 18 mg/dL (ref 5–40)

## 2016-11-03 LAB — HEPATIC FUNCTION PANEL
ALK PHOS: 81 IU/L (ref 39–117)
ALT: 28 IU/L (ref 0–44)
AST: 28 IU/L (ref 0–40)
Albumin: 4.1 g/dL (ref 3.5–4.8)
BILIRUBIN, DIRECT: 0.19 mg/dL (ref 0.00–0.40)
Bilirubin Total: 0.6 mg/dL (ref 0.0–1.2)
TOTAL PROTEIN: 6.9 g/dL (ref 6.0–8.5)

## 2016-11-03 LAB — BASIC METABOLIC PANEL
BUN / CREAT RATIO: 16 (ref 10–24)
BUN: 23 mg/dL (ref 8–27)
CHLORIDE: 103 mmol/L (ref 96–106)
CO2: 24 mmol/L (ref 18–29)
CREATININE: 1.47 mg/dL — AB (ref 0.76–1.27)
Calcium: 9.2 mg/dL (ref 8.6–10.2)
GFR calc Af Amer: 55 mL/min/{1.73_m2} — ABNORMAL LOW (ref >59)
GFR calc non Af Amer: 48 mL/min/{1.73_m2} — ABNORMAL LOW (ref >59)
GLUCOSE: 98 mg/dL (ref 65–99)
Potassium: 5 mmol/L (ref 3.5–5.2)
SODIUM: 144 mmol/L (ref 134–144)

## 2016-11-03 LAB — PSA: PROSTATE SPECIFIC AG, SERUM: 0.4 ng/mL (ref 0.0–4.0)

## 2016-11-07 ENCOUNTER — Encounter: Payer: Medicare Other | Admitting: Family Medicine

## 2016-11-14 ENCOUNTER — Other Ambulatory Visit: Payer: Self-pay | Admitting: Adult Health

## 2016-11-27 DIAGNOSIS — M79673 Pain in unspecified foot: Secondary | ICD-10-CM | POA: Diagnosis not present

## 2016-11-27 DIAGNOSIS — D3613 Benign neoplasm of peripheral nerves and autonomic nervous system of lower limb, including hip: Secondary | ICD-10-CM | POA: Diagnosis not present

## 2016-12-05 ENCOUNTER — Encounter: Payer: Medicare Other | Admitting: Family Medicine

## 2016-12-24 ENCOUNTER — Encounter: Payer: Self-pay | Admitting: Family Medicine

## 2016-12-24 ENCOUNTER — Ambulatory Visit (INDEPENDENT_AMBULATORY_CARE_PROVIDER_SITE_OTHER): Payer: Medicare Other | Admitting: Family Medicine

## 2016-12-24 VITALS — BP 128/82 | Ht 72.0 in | Wt 206.6 lb

## 2016-12-24 DIAGNOSIS — Z1159 Encounter for screening for other viral diseases: Secondary | ICD-10-CM

## 2016-12-24 DIAGNOSIS — R0683 Snoring: Secondary | ICD-10-CM | POA: Diagnosis not present

## 2016-12-24 DIAGNOSIS — G4730 Sleep apnea, unspecified: Secondary | ICD-10-CM

## 2016-12-24 DIAGNOSIS — Z0000 Encounter for general adult medical examination without abnormal findings: Secondary | ICD-10-CM | POA: Diagnosis not present

## 2016-12-24 NOTE — Progress Notes (Signed)
Subjective:    Patient ID: Roger [name], male    DOB: 11/01/1946, 71 y.o.   MRN: [id number]  HPI  AWV- Annual Wellness Visit  The patient was seen for their annual wellness visit. The patient's past medical history, surgical history, and family history were reviewed. Pertinent vaccines were reviewed ( tetanus, pneumonia, shingles, flu) The patient's medication list was reviewed and updated.  The height and weight were entered. The patient's current BMI is:28.1  Cognitive screening was completed. Outcome of Mini - Cog: pass  Falls within the past 6 months:none  Current tobacco usage: none (All patients who use tobacco were given written and verbal information on quitting)  Recent listing of emergency department/hospitalizations over the past year were reviewed.  current specialist the patient sees on a regular basis: dr Raliegh Ip- cardiology (once a year)  Results for orders placed or performed in visit on 09/25/16  Lipid panel  Result Value Ref Range   Cholesterol, Total 138 100 - 199 mg/dL   Triglycerides 89 0 - 149 mg/dL   HDL 51 >39 mg/dL   VLDL Cholesterol Cal 18 5 - 40 mg/dL   LDL Calculated 69 0 - 99 mg/dL   Chol/HDL Ratio 2.7 0.0 - 5.0 ratio units  Hepatic function panel  Result Value Ref Range   Total Protein 6.9 6.0 - 8.5 g/dL   Albumin 4.1 3.5 - 4.8 g/dL   Bilirubin Total 0.6 0.0 - 1.2 mg/dL   Bilirubin, Direct 0.19 0.00 - 0.40 mg/dL   Alkaline Phosphatase 81 39 - 117 IU/L   AST 28 0 - 40 IU/L   ALT 28 0 - 44 IU/L  Basic metabolic panel  Result Value Ref Range   Glucose 98 65 - 99 mg/dL   BUN 23 8 - 27 mg/dL   Creatinine, Ser 1.47 (H) 0.76 - 1.27 mg/dL   GFR calc non Af Amer 48 (L) >59 mL/min/1.73   GFR calc Af Amer 55 (L) >59 mL/min/1.73   BUN/Creatinine Ratio 16 10 - 24   Sodium 144 134 - 144 mmol/L   Potassium 5.0 3.5 - 5.2 mmol/L   Chloride 103 96 - 106 mmol/L   CO2 24 18 - 29 mmol/L   Calcium 9.2 8.6 - 10.2 mg/dL  PSA  Result Value Ref Range   Prostate Specific Ag, Serum 0.4 0.0 - 4.0 ng/mL     Medicare annual wellness visit patient questionnaire was reviewed.  A written screening schedule for the patient for the next 5-10 years was given. Appropriate discussion of followup regarding next visit was discussed.  Last one done in 13 Patient states his wife says he snores. Patient fills out a sleepiness scale. Substantial difficulties with daytime drowsiness.. Wheezing at a sleep suddenly. Feels tired a lot. Very heavy snoring. Strong family history of sleep apnea. Intermittent apnea spells per patient. Known coronary artery disease  Due colon this summer pt to call    Review of Systems  Constitutional: Negative for activity change, appetite change and fever.  HENT: Negative for congestion and rhinorrhea.   Eyes: Negative for discharge.  Respiratory: Negative for cough and wheezing.   Cardiovascular: Negative for chest pain.  Gastrointestinal: Negative for abdominal pain, blood in stool and vomiting.  Genitourinary: Negative for difficulty urinating and frequency.  Musculoskeletal: Negative for neck pain.  Skin: Negative for rash.  Allergic/Immunologic: Negative for environmental allergies and food allergies.  Neurological: Negative for weakness and headaches.  Psychiatric/Behavioral: Negative for agitation.  All other systems reviewed  and are negative.      Objective:   Physical Exam  Constitutional: He appears well-developed and well-nourished.  HENT:  Head: Normocephalic and atraumatic.  Right Ear: External ear normal.  Left Ear: External ear normal.  Nose: Nose normal.  Mouth/Throat: Oropharynx is clear and moist.  Eyes: EOM are normal. Pupils are equal, round, and reactive to light.  Neck: Normal range of motion. Neck supple. No thyromegaly present.  Cardiovascular: Normal rate, regular rhythm and normal heart sounds.   No murmur heard. Pulmonary/Chest: Effort normal and breath sounds normal. No respiratory  distress. He has no wheezes.  Abdominal: Soft. Bowel sounds are normal. He exhibits no distension and no mass. There is no tenderness.  Genitourinary: Penis normal.  Musculoskeletal: Normal range of motion. He exhibits no edema.  Lymphadenopathy:    He has no cervical adenopathy.  Neurological: He is alert. He exhibits normal muscle tone.  Skin: Skin is warm and dry. No erythema.  Psychiatric: He has a normal mood and affect. His behavior is normal. Judgment normal.  Vitals reviewed.         Assessment & Plan:  Impression 1 wellness exam up-to-date on all vaccinations. Due to have colonoscopy this summer patient due to call. Exercising fairly regularly. Watching diet #2 coronary artery disease this along with blood pressure and lipid issues followed by cardiologist #3 sleep apnea new diagnosis discussed length of think this is accurate. With #2 further workup warranted discussed. We'll do sleep study further recommendations based results. Blood work reviewed diet exercise discussed

## 2016-12-25 ENCOUNTER — Encounter: Payer: Self-pay | Admitting: Family Medicine

## 2016-12-25 DIAGNOSIS — M79673 Pain in unspecified foot: Secondary | ICD-10-CM | POA: Diagnosis not present

## 2016-12-25 DIAGNOSIS — D3613 Benign neoplasm of peripheral nerves and autonomic nervous system of lower limb, including hip: Secondary | ICD-10-CM | POA: Diagnosis not present

## 2016-12-25 LAB — HEPATITIS C ANTIBODY: Hep C Virus Ab: <0.1 s/co ratio (ref 0.0–0.9)

## 2016-12-30 ENCOUNTER — Encounter: Payer: Self-pay | Admitting: Family Medicine

## 2017-01-02 ENCOUNTER — Other Ambulatory Visit (HOSPITAL_BASED_OUTPATIENT_CLINIC_OR_DEPARTMENT_OTHER): Payer: Self-pay

## 2017-01-02 DIAGNOSIS — R0683 Snoring: Secondary | ICD-10-CM

## 2017-01-02 DIAGNOSIS — G4730 Sleep apnea, unspecified: Secondary | ICD-10-CM

## 2017-01-02 DIAGNOSIS — G4710 Hypersomnia, unspecified: Secondary | ICD-10-CM

## 2017-01-23 ENCOUNTER — Encounter: Payer: Self-pay | Admitting: Internal Medicine

## 2017-01-23 ENCOUNTER — Ambulatory Visit: Payer: Medicare Other | Attending: Family Medicine | Admitting: Neurology

## 2017-01-23 DIAGNOSIS — Z79899 Other long term (current) drug therapy: Secondary | ICD-10-CM | POA: Diagnosis not present

## 2017-01-23 DIAGNOSIS — G4710 Hypersomnia, unspecified: Secondary | ICD-10-CM

## 2017-01-23 DIAGNOSIS — G4761 Periodic limb movement disorder: Secondary | ICD-10-CM | POA: Diagnosis not present

## 2017-01-23 DIAGNOSIS — R0683 Snoring: Secondary | ICD-10-CM

## 2017-01-23 DIAGNOSIS — G4733 Obstructive sleep apnea (adult) (pediatric): Secondary | ICD-10-CM | POA: Diagnosis not present

## 2017-01-23 DIAGNOSIS — Z7982 Long term (current) use of aspirin: Secondary | ICD-10-CM | POA: Insufficient documentation

## 2017-01-23 DIAGNOSIS — G4730 Sleep apnea, unspecified: Secondary | ICD-10-CM

## 2017-01-24 DIAGNOSIS — X32XXXD Exposure to sunlight, subsequent encounter: Secondary | ICD-10-CM | POA: Diagnosis not present

## 2017-01-24 DIAGNOSIS — L570 Actinic keratosis: Secondary | ICD-10-CM | POA: Diagnosis not present

## 2017-01-24 DIAGNOSIS — B078 Other viral warts: Secondary | ICD-10-CM | POA: Diagnosis not present

## 2017-01-24 DIAGNOSIS — Z1283 Encounter for screening for malignant neoplasm of skin: Secondary | ICD-10-CM | POA: Diagnosis not present

## 2017-01-24 DIAGNOSIS — L821 Other seborrheic keratosis: Secondary | ICD-10-CM | POA: Diagnosis not present

## 2017-01-26 NOTE — Procedures (Signed)
[place] A. Merlene Laughter, MD     [email or web address]             NOCTURNAL POLYSOMNOGRAPHY   LOCATION: [facility]   Patient Name: Roger [name], Roger [name] Date: 01/23/2017 Gender: Male D.O.B: [birth year] Age (years): 13 Referring Provider: Margaretmary Eddy Height (inches): 72 Interpreting Physician: Phillips Odor MD, ABSM Weight (lbs): 200 RPSGT: Peak, Robert BMI: 27 MRN: [id number] Neck Size: 17.00 CLINICAL INFORMATION Sleep Study Type: NPSG  Indication for sleep study: Snoring  Epworth Sleepiness Score: 6  SLEEP STUDY TECHNIQUE As per the AASM Manual for the Scoring of Sleep and Associated Events v2.3 (April 2016) with a hypopnea requiring 4% desaturations.  The channels recorded and monitored were frontal, central and occipital EEG, electrooculogram (EOG), submentalis EMG (chin), nasal and oral airflow, thoracic and abdominal wall motion, anterior tibialis EMG, snore microphone, electrocardiogram, and pulse oximetry.  MEDICATIONS Medications self-administered by patient taken the night of the study : N/A  Current Outpatient Prescriptions:  .  acetaminophen (TYLENOL) 500 MG tablet, Take 1,000 mg by mouth every 6 (six) hours as needed (pain)., Disp: , Rfl:  .  amoxicillin (AMOXIL) 500 MG capsule, Take 1 capsule (500 mg total) by mouth 3 (three) times daily. For 10 days (Patient not taking: Reported on 12/24/2016), Disp: 30 capsule, Rfl: 0 .  aspirin 81 MG tablet, Take 81 mg by mouth daily., Disp: , Rfl:  .  atorvastatin (LIPITOR) 20 MG tablet, TAKE ONE TABLET BY MOUTH ONCE DAILY AT 6 P.M., Disp: 90 tablet, Rfl: 3 .  cefPROZIL (CEFZIL) 500 MG tablet, Take 1 tablet by mouth twice a day for 10 days. (Patient not taking: Reported on 12/24/2016), Disp: 20 tablet, Rfl: 0 .  cetirizine (ZYRTEC) 10 MG tablet, Take 10 mg by mouth daily as needed for allergies., Disp: , Rfl:  .  lisinopril (PRINIVIL,ZESTRIL) 20 MG tablet, TAKE ONE TABLET BY MOUTH ONCE DAILY, Disp: 90  tablet, Rfl: 1 .  metoprolol tartrate (LOPRESSOR) 25 MG tablet, TAKE ONE-HALF TABLET BY MOUTH TWICE DAILY, Disp: 90 tablet, Rfl: 1 .  Multiple Vitamin (MULTIVITAMIN) capsule, Take 1 capsule by mouth daily., Disp: , Rfl:  .  NITROSTAT 0.4 MG SL tablet, DISSOLVE ONE TABLET UNDER THE TONGUE EVERY 5 MINUTES AS NEEDED FOR CHEST PAIN.  DO NOT EXCEED A TOTAL OF 3 DOSES IN 15 MINUTES, Disp: 25 tablet, Rfl: 3   SLEEP ARCHITECTURE The study was initiated at 10:28:30 PM and ended at 5:07:02 AM.  Sleep onset time was 12.5 minutes and the sleep efficiency was 83.2%. The total sleep time was 331.5 minutes.  Stage REM latency was 56.5 minutes.  The patient spent 12.07% of the night in stage N1 sleep, 68.93% in stage N2 sleep, 0.00% in stage N3 and 19.00% in REM.  Alpha intrusion was absent.  Supine sleep was 7.70%.  RESPIRATORY PARAMETERS The overall apnea/hypopnea index (AHI) was 7.4 per hour. There were 26 total apneas, including 2 obstructive, 24 central and 0 mixed apneas. There were 15 hypopneas and 25 RERAs.  The AHI during Stage REM sleep was 2.9 per hour.  AHI while supine was 75.2 per hour.  The mean oxygen saturation was 94.79%. The minimum SpO2 during sleep was 88.00%.  Moderate snoring was noted during this study.  CARDIAC DATA The 2 lead EKG demonstrated sinus rhythm. The mean heart rate was 60.16 beats per minute. Other EKG findings include: None. LEG MOVEMENT DATA The total PLMS were 45 with a resulting PLMS index of 8.14. Associated arousal  with leg movement index was 0.2.  IMPRESSIONS - Mild obstructive sleep apnea not requiring positive pressure treatment. - Mild periodic limb movements of sleep occurred during the study. No significant associated arousals. - Absent slow wave sleep is noted.  Delano Metz, MD Diplomate, American Board of Sleep Medicine.

## 2017-01-27 ENCOUNTER — Other Ambulatory Visit: Payer: Self-pay | Admitting: Cardiovascular Disease

## 2017-01-29 ENCOUNTER — Telehealth: Payer: Self-pay | Admitting: Internal Medicine

## 2017-01-29 NOTE — Telephone Encounter (Signed)
Pt received letter that it was time to schedule his 5 yrs colonoscopy, He has Medicare and not GI problems. He said that he would call back Thursday afternoon. I told him that I would let DS be aware that he would be calling.

## 2017-01-31 ENCOUNTER — Telehealth: Payer: Self-pay

## 2017-01-31 NOTE — Telephone Encounter (Signed)
See separate triage. However, pt will need to be called when we have May schedule.

## 2017-02-05 NOTE — Telephone Encounter (Addendum)
Gastroenterology Pre-Procedure Review  Request Date: 01/31/2017 Requesting Physician: ON RECALL  PATIENT REVIEW QUESTIONS: The patient responded to the following health history questions as indicated:     Pt has hx of adenomatous polyps  1. Diabetes Melitis: no 2. Joint replacements in the past 12 months: no 3. Major health problems in the past 3 months: no 4. Has an artificial valve or MVP: no 5. Has a defibrillator: no 6. Has been advised in past to take antibiotics in advance of a procedure like teeth cleaning: no 7. Family history of colon cancer: no  8. Alcohol Use: A glass of wine a couple of times a week 9. History of sleep apnea: no  10. History of coronary artery or other vascular stents placed within the last 12 months: no    MEDICATIONS & ALLERGIES:    Patient reports the following regarding taking any blood thinners:   Plavix? no Aspirin? YES Coumadin? no Brilinta? no Xarelto? no Eliquis? no Pradaxa? no Savaysa? no Effient? no  Patient confirms/reports the following medications:  Current Outpatient Prescriptions  Medication Sig Dispense Refill  . acetaminophen (TYLENOL) 500 MG tablet Take 1,000 mg by mouth every 6 (six) hours as needed (pain).    Marland Kitchen aspirin 81 MG tablet Take 81 mg by mouth daily.    Marland Kitchen atorvastatin (LIPITOR) 20 MG tablet TAKE ONE TABLET BY MOUTH ONCE DAILY AT 6 P.M. 90 tablet 3  . cetirizine (ZYRTEC) 10 MG tablet Take 10 mg by mouth daily as needed for allergies.    Marland Kitchen lisinopril (PRINIVIL,ZESTRIL) 20 MG tablet TAKE ONE TABLET BY MOUTH ONCE DAILY 90 tablet 1  . metoprolol tartrate (LOPRESSOR) 25 MG tablet TAKE ONE-HALF TABLET BY MOUTH TWICE DAILY 90 tablet 3  . Multiple Vitamin (MULTIVITAMIN) capsule Take 1 capsule by mouth daily.    Marland Kitchen NITROSTAT 0.4 MG SL tablet DISSOLVE ONE TABLET UNDER THE TONGUE EVERY 5 MINUTES AS NEEDED FOR CHEST PAIN.  DO NOT EXCEED A TOTAL OF 3 DOSES IN 15 MINUTES (Patient not taking: Reported on 01/31/2017) 25 tablet 3   No  current facility-administered medications for this visit.     Patient confirms/reports the following allergies:  No Known Allergies  No orders of the defined types were placed in this encounter.   AUTHORIZATION INFORMATION Primary Insurance:   ID #:   Group #:  Pre-Cert / Auth required: Pre-Cert / Auth #:   Secondary Insurance:   ID #:   Group #:  Pre-Cert / Auth required: Pre-Cert / Auth #:   SCHEDULE INFORMATION: Procedure has been scheduled as follows:  Date:               Time:  Location:   This Gastroenterology Pre-Precedure Review Form is being routed to the following provider(s): R. Garfield Cornea, MD

## 2017-02-06 NOTE — Telephone Encounter (Signed)
Ok to schedule.

## 2017-02-07 ENCOUNTER — Encounter: Payer: Self-pay | Admitting: Cardiovascular Disease

## 2017-02-07 ENCOUNTER — Ambulatory Visit (INDEPENDENT_AMBULATORY_CARE_PROVIDER_SITE_OTHER): Payer: Medicare Other | Admitting: Cardiovascular Disease

## 2017-02-07 VITALS — BP 122/62 | HR 65 | Ht 72.0 in | Wt 205.0 lb

## 2017-02-07 DIAGNOSIS — E7800 Pure hypercholesterolemia, unspecified: Secondary | ICD-10-CM | POA: Diagnosis not present

## 2017-02-07 DIAGNOSIS — I2510 Atherosclerotic heart disease of native coronary artery without angina pectoris: Secondary | ICD-10-CM

## 2017-02-07 DIAGNOSIS — I10 Essential (primary) hypertension: Secondary | ICD-10-CM

## 2017-02-07 NOTE — Patient Instructions (Signed)
Your physician wants you to follow-up in: 1 year Dr Koneswaran You will receive a reminder letter in the mail two months in advance. If you don't receive a letter, please call our office to schedule the follow-up appointment.     Your physician recommends that you continue on your current medications as directed. Please refer to the Current Medication list given to you today.    If you need a refill on your cardiac medications before your next appointment, please call your pharmacy.      Thank you for choosing [facility] !         

## 2017-02-07 NOTE — Progress Notes (Signed)
SUBJECTIVE: The patient presents for followup of CAD. He underwent drug-eluting stent placement to the LAD on 07/16/14. Coronary angiography demonstrated severe disease in the proximal to mid left anterior descending artery. This was successfully treated with a 3.0 x 24 promus drug-eluting stent. There was a large first diagonal which was jailed but continued to have TIMI-3 flow. He also had widely patent left circumflex artery and its branches and a patent RCA.  The patient denies any symptoms of chest pain, palpitations, shortness of breath, lightheadedness, dizziness, leg swelling, orthopnea, PND, and syncope.  Due for a colonoscopy in the near future.  ECG performed in the office today which I personally reviewed demonstrates normal sinus rhythm with no ischemic ST segment or T-wave abnormalities, nor any arrhythmias.   Soc: His sister-in-law, Manus Gunning, is a Cabin crew in [place].    Review of Systems: As per "subjective", otherwise negative.  No Known Allergies  Current Outpatient Prescriptions  Medication Sig Dispense Refill  . acetaminophen (TYLENOL) 500 MG tablet Take 1,000 mg by mouth every 6 (six) hours as needed (pain).    Marland Kitchen aspirin 81 MG tablet Take 81 mg by mouth daily.    Marland Kitchen atorvastatin (LIPITOR) 20 MG tablet TAKE ONE TABLET BY MOUTH ONCE DAILY AT 6 P.M. 90 tablet 3  . cetirizine (ZYRTEC) 10 MG tablet Take 10 mg by mouth daily as needed for allergies.    Marland Kitchen lisinopril (PRINIVIL,ZESTRIL) 20 MG tablet TAKE ONE TABLET BY MOUTH ONCE DAILY 90 tablet 1  . metoprolol tartrate (LOPRESSOR) 25 MG tablet TAKE ONE-HALF TABLET BY MOUTH TWICE DAILY 90 tablet 3  . Multiple Vitamin (MULTIVITAMIN) capsule Take 1 capsule by mouth daily.    Marland Kitchen NITROSTAT 0.4 MG SL tablet DISSOLVE ONE TABLET UNDER THE TONGUE EVERY 5 MINUTES AS NEEDED FOR CHEST PAIN.  DO NOT EXCEED A TOTAL OF 3 DOSES IN 15 MINUTES 25 tablet 3   No current facility-administered medications for this visit.     Past Medical  History:  Diagnosis Date  . Adenomatous polyps   . Hypertension   . Impaired fasting glucose   . Seasonal allergies     Past Surgical History:  Procedure Laterality Date  . COLONOSCOPY  10/03/05   inflamed hyperplastic polyp/adenomatous poylp, left sided diverticula  . COLONOSCOPY  03/03/2012   Procedure: COLONOSCOPY;  Surgeon: Daneil Dolin, MD;  [facility];  Service: Endoscopy;  Laterality: N/A;  10:15  . CORONARY ANGIOPLASTY WITH STENT PLACEMENT  07/16/2014   "1"  . KNEE ARTHROSCOPY Right ~ 1994  . KNEE ARTHROSCOPY Left ~ 2001  . LEFT AND RIGHT HEART CATHETERIZATION WITH CORONARY ANGIOGRAM N/A 07/16/2014   Procedure: LEFT AND RIGHT HEART CATHETERIZATION WITH CORONARY ANGIOGRAM;  Surgeon: [name], MD;  Location: [facility] CATH LAB;  Service: Cardiovascular;  Laterality: N/A;    Social History   Social History  . Marital status: Married    Spouse name: N/A  . Number of children: N/A  . Years of education: N/A   Occupational History  . Not on file.   Social History Main Topics  . Smoking status: Never Smoker  . Smokeless tobacco: Never Used  . Alcohol use 1.2 oz/week    2 Glasses of wine per week  . Drug use: No  . Sexual activity: Yes   Other Topics Concern  . Not on file   Social History Narrative  . No narrative on file     Vitals:   02/07/17 1027  BP: 122/62  Pulse: 65  SpO2: 97%  Weight: 205 lb (93 kg)  Height: 6' (1.829 m)    PHYSICAL EXAM General: NAD HEENT: Normal. Neck: No JVD, no thyromegaly. Lungs: Clear to auscultation bilaterally with normal respiratory effort. CV: Nondisplaced PMI.  Regular rate and rhythm, normal S1/S2, no S3/S4, no murmur. No pretibial or periankle edema.  No carotid bruit.   Abdomen: Soft, nontender, no distention.  Neurologic: Alert and oriented.  Psych: Normal affect. Skin: Normal. Musculoskeletal: No gross deformities.    ECG: Most recent ECG reviewed.      ASSESSMENT AND PLAN: 1. CAD  s/p LAD DES: Stable ischemic heart disease. Continue aspirin, metoprolol, and statin therapy.   2. Essential HTN: Well controlled on lisinopril 20 mg daily. No changes.  3. Hyperlipidemia: LDL 69 on 11/02/16. Continue Lipitor 20 mg.  Dispo: f/u 1 year.   Kate Sable, M.D., F.A.C.C.

## 2017-02-11 ENCOUNTER — Telehealth: Payer: Self-pay | Admitting: Family Medicine

## 2017-02-11 NOTE — Telephone Encounter (Signed)
Apologize to pt tell him they still are not sending me the report but just dropping it into the electronic record  Test showed very mild sleep apne, 7 epesodes per hr of slowing or temporatily stopping breathing, erxcperts rec no intervention until tha t hits 15 per hr, we may want to repeat studfy in a couple years

## 2017-02-11 NOTE — Telephone Encounter (Signed)
Calling to get results to sleep study.

## 2017-02-11 NOTE — Telephone Encounter (Signed)
Discussed with pt. Pt verbalized understanding.  °

## 2017-02-11 NOTE — Telephone Encounter (Signed)
Results are in under notes on 01/23/17

## 2017-02-11 NOTE — Telephone Encounter (Signed)
Mailing a letter to call in a couple of weeks to get on the May schedule.

## 2017-02-12 ENCOUNTER — Other Ambulatory Visit: Payer: Self-pay | Admitting: Cardiovascular Disease

## 2017-02-14 ENCOUNTER — Encounter: Payer: Self-pay | Admitting: Family Medicine

## 2017-02-14 ENCOUNTER — Ambulatory Visit (INDEPENDENT_AMBULATORY_CARE_PROVIDER_SITE_OTHER): Payer: Medicare Other | Admitting: Family Medicine

## 2017-02-14 VITALS — BP 132/70 | Temp 99.7°F | Wt 205.4 lb

## 2017-02-14 DIAGNOSIS — J111 Influenza due to unidentified influenza virus with other respiratory manifestations: Secondary | ICD-10-CM | POA: Diagnosis not present

## 2017-02-14 DIAGNOSIS — I2510 Atherosclerotic heart disease of native coronary artery without angina pectoris: Secondary | ICD-10-CM | POA: Diagnosis not present

## 2017-02-14 MED ORDER — OSELTAMIVIR PHOSPHATE 75 MG PO CAPS: ORAL_CAPSULE | ORAL | 0 refills | Status: DC

## 2017-02-14 NOTE — Progress Notes (Signed)
   Subjective:    Patient ID: Roger [name], male    DOB: 10-Aug-1946, 71 y.o.   MRN: [id number]  Cough  This is a new problem. The current episode started in the past 7 days. Associated symptoms include a fever, headaches, myalgias and a sore throat. Treatments tried: Tylenol    Noticed slight fatigue on tue eve, felt a little tired  Felt vey achey  achey all over  Headache pretty bad  Barbed wir feeling in throat   not   Flu shot last y  No known illness around t lat wk  tmax 99.7  Deep cough inflam pain   Patient states no other concerns this visit.  Review of Systems  Constitutional: Positive for fever.  HENT: Positive for sore throat.   Respiratory: Positive for cough.   Musculoskeletal: Positive for myalgias.  Neurological: Positive for headaches.       Objective:   Physical Exam  Alert vitals reviewed, moderate malaise. Hydration good. Positive nasal congestion lungs no crackles or wheezes, no tachypnea, intermittent bronchial cough during exam heart regular rate and rhythm.       Assessment & Plan:  Impression influenza discussed at length. Petra Kuba of illness and potential sequela discussed. Plan Tamiflu prescribed if indicated and timing appropriate. Symptom care discussed. Warning signs discussed. WSL

## 2017-02-26 ENCOUNTER — Telehealth: Payer: Self-pay

## 2017-02-26 DIAGNOSIS — D3613 Benign neoplasm of peripheral nerves and autonomic nervous system of lower limb, including hip: Secondary | ICD-10-CM | POA: Diagnosis not present

## 2017-02-26 DIAGNOSIS — R252 Cramp and spasm: Secondary | ICD-10-CM | POA: Diagnosis not present

## 2017-02-26 MED ORDER — PEG 3350-KCL-NA BICARB-NACL 420 G PO SOLR: 4000.0000 mL | ORAL | 0 refills | Status: DC

## 2017-02-26 NOTE — Addendum Note (Signed)
Addended by: Everardo All on: 02/26/2017 11:06 AM   Modules accepted: Orders

## 2017-02-26 NOTE — Telephone Encounter (Signed)
Pt was calling to speak with DS about scheduling his colonoscopy in May. I transferred call to VM

## 2017-02-26 NOTE — Telephone Encounter (Signed)
Pt has been scheduled.  °

## 2017-02-26 NOTE — Telephone Encounter (Signed)
Pt has been scheduled for 04/03/2017 at 8:30 with Dr. Gala Romney for colonoscopy.  Sending Rx to the pharmacy and mailing instructions to pt.

## 2017-03-06 ENCOUNTER — Telehealth: Payer: Self-pay

## 2017-03-06 ENCOUNTER — Other Ambulatory Visit: Payer: Self-pay

## 2017-03-06 DIAGNOSIS — Z86010 Personal history of colonic polyps: Secondary | ICD-10-CM

## 2017-03-06 NOTE — Telephone Encounter (Signed)
Pt is scheduled for his procedure and said he has a conflict and would like to speak to DS. Please call [phone]

## 2017-03-07 NOTE — Telephone Encounter (Signed)
Mailed

## 2017-03-07 NOTE — Telephone Encounter (Signed)
I called Roger [name] and he has changed to 04/11/2017 at 1:15 AM. Hoyle Sauer is aware in Endo.  I am mailing new instructions to Roger [name].

## 2017-03-12 NOTE — Telephone Encounter (Signed)
No PA is needed for TCS 

## 2017-03-18 ENCOUNTER — Telehealth: Payer: Self-pay

## 2017-03-18 NOTE — Telephone Encounter (Signed)
Time changed for tcs scheduled for 04/11/17. Called and informed pt. New procedure time is 2:00pm, pt to arrive at 1:00pm. Informed pt to start drinking prep the morning of procedure at 9:00am and complete by 11:00am. Endo scheduler already aware.

## 2017-04-11 ENCOUNTER — Ambulatory Visit (HOSPITAL_COMMUNITY)
Admission: RE | Admit: 2017-04-11 | Discharge: 2017-04-11 | Disposition: A | Discharge status: Home / Self Care | Payer: Medicare Other | Source: Ambulatory Visit | Attending: Internal Medicine | Admitting: Internal Medicine

## 2017-04-11 ENCOUNTER — Encounter (HOSPITAL_COMMUNITY): Payer: Self-pay

## 2017-04-11 ENCOUNTER — Encounter (HOSPITAL_COMMUNITY)
Admission: RE | Disposition: A | Discharge status: Home / Self Care | Payer: Self-pay | Source: Ambulatory Visit | Attending: Internal Medicine

## 2017-04-11 DIAGNOSIS — Z8719 Personal history of other diseases of the digestive system: Secondary | ICD-10-CM | POA: Diagnosis not present

## 2017-04-11 DIAGNOSIS — Z79899 Other long term (current) drug therapy: Secondary | ICD-10-CM | POA: Diagnosis not present

## 2017-04-11 DIAGNOSIS — Z808 Family history of malignant neoplasm of other organs or systems: Secondary | ICD-10-CM | POA: Insufficient documentation

## 2017-04-11 DIAGNOSIS — R7301 Impaired fasting glucose: Secondary | ICD-10-CM | POA: Insufficient documentation

## 2017-04-11 DIAGNOSIS — Z1211 Encounter for screening for malignant neoplasm of colon: Secondary | ICD-10-CM | POA: Diagnosis not present

## 2017-04-11 DIAGNOSIS — K5730 Diverticulosis of large intestine without perforation or abscess without bleeding: Secondary | ICD-10-CM | POA: Insufficient documentation

## 2017-04-11 DIAGNOSIS — I10 Essential (primary) hypertension: Secondary | ICD-10-CM | POA: Diagnosis not present

## 2017-04-11 DIAGNOSIS — Z86010 Personal history of colonic polyps: Secondary | ICD-10-CM | POA: Diagnosis not present

## 2017-04-11 DIAGNOSIS — D123 Benign neoplasm of transverse colon: Secondary | ICD-10-CM | POA: Diagnosis not present

## 2017-04-11 DIAGNOSIS — Z860101 Personal history of adenomatous and serrated colon polyps: Secondary | ICD-10-CM

## 2017-04-11 DIAGNOSIS — Z7982 Long term (current) use of aspirin: Secondary | ICD-10-CM | POA: Insufficient documentation

## 2017-04-11 HISTORY — PX: COLONOSCOPY: SHX5424

## 2017-04-11 HISTORY — PX: POLYPECTOMY: SHX5525

## 2017-04-11 SURGERY — COLONOSCOPY
Anesthesia: Moderate Sedation

## 2017-04-11 MED ORDER — SODIUM CHLORIDE 0.9 % IV SOLN
INTRAVENOUS | Status: DC
  Administered 2017-04-11: 13:00:00 via INTRAVENOUS

## 2017-04-11 MED ORDER — MIDAZOLAM HCL 5 MG/5ML IJ SOLN
INTRAMUSCULAR | Status: DC | PRN
  Administered 2017-04-11: 1 mg via INTRAVENOUS
  Administered 2017-04-11: 2 mg via INTRAVENOUS
  Administered 2017-04-11: 1 mg via INTRAVENOUS

## 2017-04-11 MED ORDER — MEPERIDINE HCL 100 MG/ML IJ SOLN
INTRAMUSCULAR | Status: AC
  Filled 2017-04-11: qty 2

## 2017-04-11 MED ORDER — MIDAZOLAM HCL 5 MG/5ML IJ SOLN
INTRAMUSCULAR | Status: AC
  Filled 2017-04-11: qty 10

## 2017-04-11 MED ORDER — ONDANSETRON HCL 4 MG/2ML IJ SOLN
INTRAMUSCULAR | Status: AC
  Filled 2017-04-11: qty 2

## 2017-04-11 MED ORDER — MEPERIDINE HCL 100 MG/ML IJ SOLN
INTRAMUSCULAR | Status: DC | PRN
  Administered 2017-04-11: 25 mg via INTRAVENOUS
  Administered 2017-04-11: 50 mg via INTRAVENOUS
  Administered 2017-04-11: 25 mg via INTRAVENOUS

## 2017-04-11 MED ORDER — ONDANSETRON HCL 4 MG/2ML IJ SOLN
INTRAMUSCULAR | Status: DC | PRN
  Administered 2017-04-11: 4 mg via INTRAVENOUS

## 2017-04-11 MED ORDER — STERILE WATER FOR IRRIGATION IR SOLN
Status: DC | PRN
  Administered 2017-04-11: 14:00:00

## 2017-04-11 NOTE — H&P (Signed)
@LOGO @   Primary Care Physician:  Mikey Kirschner, MD Primary Gastroenterologist:  Dr. Gala Romney  Pre-Procedure History & Physical: HPI:  Roger [name] is a 71 y.o. male here for surveillance colonoscopy. No bowel symptoms. History of a rectal adenoma removed 5 years ago.  Past Medical History:  Diagnosis Date  . Adenomatous polyps   . Hypertension   . Impaired fasting glucose   . Seasonal allergies     Past Surgical History:  Procedure Laterality Date  . COLONOSCOPY  10/03/05   inflamed hyperplastic polyp/adenomatous poylp, left sided diverticula  . COLONOSCOPY  03/03/2012   Procedure: COLONOSCOPY;  Surgeon: Daneil Dolin, MD;  Location: [facility];  Service: Endoscopy;  Laterality: N/A;  10:15  . CORONARY ANGIOPLASTY WITH STENT PLACEMENT  07/16/2014   "1"  . KNEE ARTHROSCOPY Right ~ 1994  . KNEE ARTHROSCOPY Left ~ 2001  . LEFT AND RIGHT HEART CATHETERIZATION WITH CORONARY ANGIOGRAM N/A 07/16/2014   Procedure: LEFT AND RIGHT HEART CATHETERIZATION WITH CORONARY ANGIOGRAM;  Surgeon: [name], MD;  Location: [facility] CATH LAB;  Service: Cardiovascular;  Laterality: N/A;    Prior to Admission medications   Medication Sig Start Date End Date Taking? Authorizing Provider  acetaminophen (TYLENOL) 500 MG tablet Take 500-1,000 mg by mouth every 6 (six) hours as needed for mild pain or headache.    Yes [provider]  aspirin 81 MG tablet Take 81 mg by mouth daily.   Yes [provider]  atorvastatin (LIPITOR) 20 MG tablet TAKE ONE TABLET BY MOUTH ONCE DAILY AT 6 P.M. 11/14/16  Yes Herminio Commons, MD  cetirizine (ZYRTEC) 10 MG tablet Take 10 mg by mouth daily as needed for allergies.   Yes [provider]  lisinopril (PRINIVIL,ZESTRIL) 20 MG tablet TAKE ONE TABLET BY MOUTH ONCE DAILY 02/12/17  Yes Herminio Commons, MD  metoprolol tartrate (LOPRESSOR) 25 MG tablet TAKE ONE-HALF TABLET BY MOUTH TWICE DAILY 01/28/17  Yes Herminio Commons, MD   Multiple Vitamin (MULTIVITAMIN WITH MINERALS) TABS tablet Take 1 tablet by mouth daily.   Yes [provider]  polyethylene glycol-electrolytes (TRILYTE) 420 g solution Take 4,000 mLs by mouth as directed. 02/26/17  Yes Lesley Galentine, [name], MD  NITROSTAT 0.4 MG SL tablet DISSOLVE ONE TABLET UNDER THE TONGUE EVERY 5 MINUTES AS NEEDED FOR CHEST PAIN.  DO NOT EXCEED A TOTAL OF 3 DOSES IN 15 MINUTES 09/24/16   Herminio Commons, MD    Allergies as of 03/06/2017  . (No Known Allergies)    Family History  Problem Relation Age of Onset  . Liver cancer Father   . Cancer Father        liver  . Colon cancer Neg Hx     Social History   Social History  . Marital status: Married    Spouse name: N/A  . Number of children: N/A  . Years of education: N/A   Occupational History  . Not on file.   Social History Main Topics  . Smoking status: Never Smoker  . Smokeless tobacco: Never Used  . Alcohol use 1.2 oz/week    2 Glasses of wine per week  . Drug use: No  . Sexual activity: Yes   Other Topics Concern  . Not on file   Social History Narrative  . No narrative on file    Review of Systems: See HPI, otherwise negative ROS  Physical Exam: BP 136/72   Pulse 61   Temp 97.7  F (36.5 C) (Oral)   Resp 10   Ht 6' (1.829 m)   Wt 195 lb (88.5 kg)   SpO2 98%   BMI 26.45 kg/m  General:   Alert,  Well-developed, well-nourished, pleasant and cooperative in NAD Neck:  Supple; no masses or thyromegaly. No significant cervical adenopathy. Lungs:  Clear throughout to auscultation.   No wheezes, crackles, or rhonchi. No acute distress. Heart:  Regular rate and rhythm; no murmurs, clicks, rubs,  or gallops. Abdomen: Non-distended, normal bowel sounds.  Soft and nontender without appreciable mass or hepatosplenomegaly.  Pulses:  Normal pulses noted. Extremities:  Without clubbing or edema.  Impression:  Pleasant 71 year old gentleman history of a colonic adenoma; here for sequence  colonoscopy.  Recommendations:  I have offered the patient a surveillance colonoscopy today.  The risks, benefits, limitations, alternatives and imponderables have been reviewed with the patient. Questions have been answered. All parties are agreeable.         Notice: This dictation was prepared with Dragon dictation along with smaller phrase technology. Any transcriptional errors that result from this process are unintentional and may not be corrected upon review.

## 2017-04-11 NOTE — Discharge Instructions (Addendum)
Colonoscopy Discharge Instructions  Read the instructions outlined below and refer to this sheet in the next few weeks. These discharge instructions provide you with general information on caring for yourself after you leave the hospital. Your doctor may also give you specific instructions. While your treatment has been planned according to the most current medical practices available, unavoidable complications occasionally occur. If you have any problems or questions after discharge, call Dr. Gala Romney at [phone]. ACTIVITY  You may resume your regular activity, but move at a slower pace for the next 24 hours.   Take frequent rest periods for the next 24 hours.   Walking will help get rid of the air and reduce the bloated feeling in your belly (abdomen).   No driving for 24 hours (because of the medicine (anesthesia) used during the test).    Do not sign any important legal documents or operate any machinery for 24 hours (because of the anesthesia used during the test).  NUTRITION  Drink plenty of fluids.   You may resume your normal diet as instructed by your doctor.   Begin with a light meal and progress to your normal diet. Heavy or fried foods are harder to digest and may make you feel sick to your stomach (nauseated).   Avoid alcoholic beverages for 24 hours or as instructed.  MEDICATIONS  You may resume your normal medications unless your doctor tells you otherwise.  WHAT YOU CAN EXPECT TODAY  Some feelings of bloating in the abdomen.   Passage of more gas than usual.   Spotting of blood in your stool or on the toilet paper.  IF YOU HAD POLYPS REMOVED DURING THE COLONOSCOPY:  No aspirin products for 7 days or as instructed.   No alcohol for 7 days or as instructed.   Eat a soft diet for the next 24 hours.  FINDING OUT THE RESULTS OF YOUR TEST Not all test results are available during your visit. If your test results are not back during the visit, make an appointment  with your caregiver to find out the results. Do not assume everything is normal if you have not heard from your caregiver or the medical facility. It is important for you to follow up on all of your test results.  SEEK IMMEDIATE MEDICAL ATTENTION IF:  You have more than a spotting of blood in your stool.   Your belly is swollen (abdominal distention).   You are nauseated or vomiting.   You have a temperature over 101.   You have abdominal pain or discomfort that is severe or gets worse throughout the day.    Colon polyp and diverticulosis information provided  Further recommendations to follow pending review of pathology report    Colon Polyps Polyps are tissue growths inside the body. Polyps can grow in many places, including the large intestine (colon). A polyp may be a round bump or a mushroom-shaped growth. You could have one polyp or several. Most colon polyps are noncancerous (benign). However, some colon polyps can become cancerous over time. What are the causes? The exact cause of colon polyps is not known. What increases the risk? This condition is more likely to develop in people who:  Have a family history of colon cancer or colon polyps.  Are older than 10 or older than 45 if they are African American.  Have inflammatory bowel disease, such as ulcerative colitis or Crohn disease.  Are overweight.  Smoke cigarettes.  Do not get enough exercise.  Drink  too much alcohol.  Eat a diet that is:  High in fat and red meat.  Low in fiber.  Had childhood cancer that was treated with abdominal radiation. What are the signs or symptoms? Most polyps do not cause symptoms. If you have symptoms, they may include:  Blood coming from your rectum when having a bowel movement.  Blood in your stool.The stool may look dark red or black.  A change in bowel habits, such as constipation or diarrhea. How is this diagnosed? This condition is diagnosed with a colonoscopy.  This is a procedure that uses a lighted, flexible scope to look at the inside of your colon. How is this treated? Treatment for this condition involves removing any polyps that are found. Those polyps will then be tested for cancer. If cancer is found, your health care provider will talk to you about options for colon cancer treatment. Follow these instructions at home: Diet   Eat plenty of fiber, such as fruits, vegetables, and whole grains.  Eat foods that are high in calcium and vitamin D, such as milk, cheese, yogurt, eggs, liver, fish, and broccoli.  Limit foods high in fat, red meats, and processed meats, such as hot dogs, sausage, bacon, and lunch meats.  Maintain a healthy weight, or lose weight if recommended by your health care provider. General instructions   Do not smoke cigarettes.  Do not drink alcohol excessively.  Keep all follow-up visits as told by your health care provider. This is important. This includes keeping regularly scheduled colonoscopies. Talk to your health care provider about when you need a colonoscopy.  Exercise every day or as told by your health care provider. Contact a health care provider if:  You have new or worsening bleeding during a bowel movement.  You have new or increased blood in your stool.  You have a change in bowel habits.  You unexpectedly lose weight. This information is not intended to replace advice given to you by your health care provider. Make sure you discuss any questions you have with your health care provider. Document Released: 08/01/2004 Document Revised: 04/12/2016 Document Reviewed: 09/26/2015 Elsevier Interactive Patient Education  2017 Elsevier Inc[employer]    Diverticulosis Diverticulosis is a condition that develops when small pouches (diverticula) form in the wall of the large intestine (colon). The colon is where water is absorbed and stool is formed. The pouches form when the inside layer of the colon pushes through  weak spots in the outer layers of the colon. You may have a few pouches or many of them. What are the causes? The cause of this condition is not known. What increases the risk? The following factors may make you more likely to develop this condition: Being older than age 15. Your risk for this condition increases with age. Diverticulosis is rare among people younger than age 79. By age 81, many people have it. Eating a low-fiber diet. Having frequent constipation. Being overweight. Not getting enough exercise. Smoking. Taking over-the-counter pain medicines, like aspirin and ibuprofen. Having a family history of diverticulosis. What are the signs or symptoms? In most people, there are no symptoms of this condition. If you do have symptoms, they may include: Bloating. Cramps in the abdomen. Constipation or diarrhea. Pain in the lower left side of the abdomen. How is this diagnosed? This condition is most often diagnosed during an exam for other colon problems. Because diverticulosis usually has no symptoms, it often cannot be diagnosed independently. This condition may be diagnosed  by: Using a flexible scope to examine the colon (colonoscopy). Taking an X-ray of the colon after dye has been put into the colon (barium enema). Doing a CT scan. How is this treated? You may not need treatment for this condition if you have never developed an infection related to diverticulosis. If you have had an infection before, treatment may include: Eating a high-fiber diet. This may include eating more fruits, vegetables, and grains. Taking a fiber supplement. Taking a live bacteria supplement (probiotic). Taking medicine to relax your colon. Taking antibiotic medicines. Follow these instructions at home: Drink 6-8 glasses of water or more each day to prevent constipation. Try not to strain when you have a bowel movement. If you have had an infection before: Eat more fiber as directed by your  health care provider or your diet and nutrition specialist (dietitian). Take a fiber supplement or probiotic, if your health care provider approves. Take over-the-counter and prescription medicines only as told by your health care provider. If you were prescribed an antibiotic, take it as told by your health care provider. Do not stop taking the antibiotic even if you start to feel better. Keep all follow-up visits as told by your health care provider. This is important. Contact a health care provider if: You have pain in your abdomen. You have bloating. You have cramps. You have not had a bowel movement in 3 days. Get help right away if: Your pain gets worse. Your bloating becomes very bad. You have a fever or chills, and your symptoms suddenly get worse. You vomit. You have bowel movements that are bloody or black. You have bleeding from your rectum. Summary Diverticulosis is a condition that develops when small pouches (diverticula) form in the wall of the large intestine (colon). You may have a few pouches or many of them. This condition is most often diagnosed during an exam for other colon problems. If you have had an infection related to diverticulosis, treatment may include increasing the fiber in your diet, taking supplements, or taking medicines. This information is not intended to replace advice given to you by your health care provider. Make sure you discuss any questions you have with your health care provider. Document Released: 08/02/2004 Document Revised: 09/24/2016 Document Reviewed: 09/24/2016 Elsevier Interactive Patient Education  2017 [employer].

## 2017-04-11 NOTE — Op Note (Signed)
[facility] Patient Name: Roger [name] Procedure Date: 04/11/2017 2:02 PM MRN: [id number] Date of Birth: 18-May-1946 Attending MD: Norvel Richards , MD CSN: [id number] Age: 71 Admit Type: Outpatient Procedure:                Colonoscopy Indications:              High risk colon cancer surveillance: Personal                            history of colonic polyps Providers:                Norvel Richards, MD, Otis Peak B. Gwenlyn Perking RN, RN,                            Randa Spike, Technician Referring MD:              Medicines:                Midazolam 4 mg IV, Meperidine 100 mg IV,                            Ondansetron 4 mg IV Complications:            No immediate complications. Estimated Blood Loss:     Estimated blood loss: none. Procedure:                Pre-Anesthesia Assessment:                           - Prior to the procedure, a History and Physical                            was performed, and patient medications and                            allergies were reviewed. The patient's tolerance of                            previous anesthesia was also reviewed. The risks                            and benefits of the procedure and the sedation                            options and risks were discussed with the patient.                            All questions were answered, and informed consent                            was obtained. Prior Anticoagulants: The patient has                            taken no previous anticoagulant or antiplatelet  agents. ASA Grade Assessment: II - A patient with                            mild systemic disease. After reviewing the risks                            and benefits, the patient was deemed in                            satisfactory condition to undergo the procedure.                           After obtaining informed consent, the colonoscope                            was passed under direct  vision. Throughout the                            procedure, the patient's blood pressure, pulse, and                            oxygen saturations were monitored continuously. The                            [id number]) was introduced through the anus                            and advanced to the the cecum, identified by                            appendiceal orifice and ileocecal valve. The entire                            colon was well visualized. The colonoscopy was                            performed without difficulty. The patient tolerated                            the procedure well. The quality of the bowel                            preparation was adequate. The ileocecal valve,                            appendiceal orifice, and rectum were photographed. Scope In: 2:18:08 PM Scope Out: 2:32:04 PM Scope Withdrawal Time: 0 hours 6 minutes 53 seconds  Total Procedure Duration: 0 hours 13 minutes 56 seconds  Findings:      The perianal and digital rectal examinations were normal.      Scattered small and large-mouthed diverticula were found in the sigmoid       colon.      A 5 mm polyp was found in the hepatic flexure. The polyp was  semi-pedunculated. The polyp was removed with a cold snare. Resection       and retrieval were complete. Estimated blood loss was minimal.      The exam was otherwise without abnormality on direct and retroflexion       views. Impression:               - Diverticulosis in the sigmoid colon.                           - One 5 mm polyp at the hepatic flexure, removed                            with a cold snare. Resected and retrieved.                           - The examination was otherwise normal on direct                            and retroflexion views. Moderate Sedation:      Moderate (conscious) sedation was administered by the endoscopy nurse       and supervised by the endoscopist. The following parameters were        monitored: oxygen saturation, heart rate, blood pressure, respiratory       rate, EKG, adequacy of pulmonary ventilation, and response to care.       Total physician intraservice time was 43 minutes. Recommendation:           - Patient has a contact number available for                            emergencies. The signs and symptoms of potential                            delayed complications were discussed with the                            patient. Return to normal activities tomorrow.                            Written discharge instructions were provided to the                            patient.                           - Resume previous diet.                           - Continue present medications.                           - Repeat colonoscopy date to be determined after                            pending pathology results are reviewed for  surveillance based on pathology results.                           - Return to GI clinic (date not yet determined). Procedure Code(s):        --- Professional ---                           [phone], Colonoscopy, flexible; with removal of                            tumor(s), polyp(s), or other lesion(s) by snare                            technique                           99152, Moderate sedation services provided by the                            same physician or other qualified health care                            professional performing the diagnostic or                            therapeutic service that the sedation supports,                            requiring the presence of an independent trained                            observer to assist in the monitoring of the                            patient's level of consciousness and physiological                            status; initial 15 minutes of intraservice time,                            patient age 68 years or older                           [id number],  Moderate sedation services; each additional                            15 minutes intraservice time                           99153, Moderate sedation services; each additional                            15 minutes intraservice time Diagnosis Code(s):        --- Professional ---  Z86.010, Personal history of colonic polyps                           D12.3, Benign neoplasm of transverse colon (hepatic                            flexure or splenic flexure)                           K57.30, Diverticulosis of large intestine without                            perforation or abscess without bleeding CPT copyright 2016 American Medical Association. All rights reserved. The codes documented in this report are preliminary and upon coder review may  be revised to meet current compliance requirements. [name]. Keleigh Kazee, MD Norvel Richards, MD 04/11/2017 2:44:04 PM This report has been signed electronically. Number of Addenda: 0

## 2017-04-17 ENCOUNTER — Encounter: Payer: Self-pay | Admitting: Internal Medicine

## 2017-04-19 ENCOUNTER — Encounter (HOSPITAL_COMMUNITY): Payer: Self-pay | Admitting: Internal Medicine

## 2017-08-01 DIAGNOSIS — Z23 Encounter for immunization: Secondary | ICD-10-CM | POA: Diagnosis not present

## 2017-10-06 ENCOUNTER — Other Ambulatory Visit: Payer: Self-pay | Admitting: Cardiovascular Disease

## 2017-10-24 ENCOUNTER — Other Ambulatory Visit: Payer: Self-pay

## 2017-11-07 ENCOUNTER — Other Ambulatory Visit: Payer: Self-pay | Admitting: Cardiovascular Disease

## 2017-12-16 ENCOUNTER — Telehealth: Payer: Self-pay | Admitting: Family Medicine

## 2017-12-16 DIAGNOSIS — I10 Essential (primary) hypertension: Secondary | ICD-10-CM

## 2017-12-16 DIAGNOSIS — E785 Hyperlipidemia, unspecified: Secondary | ICD-10-CM

## 2017-12-16 DIAGNOSIS — Z125 Encounter for screening for malignant neoplasm of prostate: Secondary | ICD-10-CM

## 2017-12-16 NOTE — Telephone Encounter (Signed)
Pt is requesting lab orders sent over for an upcoming appt the beginning of Feb. Last labs per epic were: psa,bmp,hepatic,and lipid on 11/02/16.

## 2017-12-16 NOTE — Telephone Encounter (Signed)
Blood work ordered in Epic. Patient notified. 

## 2017-12-16 NOTE — Telephone Encounter (Signed)
Do same

## 2017-12-17 DIAGNOSIS — E785 Hyperlipidemia, unspecified: Secondary | ICD-10-CM | POA: Diagnosis not present

## 2017-12-17 DIAGNOSIS — Z125 Encounter for screening for malignant neoplasm of prostate: Secondary | ICD-10-CM | POA: Diagnosis not present

## 2017-12-17 DIAGNOSIS — I10 Essential (primary) hypertension: Secondary | ICD-10-CM | POA: Diagnosis not present

## 2017-12-18 LAB — LIPID PANEL
CHOLESTEROL TOTAL: 132 mg/dL (ref 100–199)
Chol/HDL Ratio: 2.9 ratio (ref 0.0–5.0)
HDL: 45 mg/dL (ref >39)
LDL CALC: 72 mg/dL (ref 0–99)
Triglycerides: 77 mg/dL (ref 0–149)
VLDL Cholesterol Cal: 15 mg/dL (ref 5–40)

## 2017-12-18 LAB — BASIC METABOLIC PANEL
BUN/Creatinine Ratio: 14 (ref 10–24)
BUN: 19 mg/dL (ref 8–27)
CALCIUM: 9.2 mg/dL (ref 8.6–10.2)
CO2: 23 mmol/L (ref 20–29)
Chloride: 105 mmol/L (ref 96–106)
Creatinine, Ser: 1.36 mg/dL — ABNORMAL HIGH (ref 0.76–1.27)
GFR, EST AFRICAN AMERICAN: 60 mL/min/{1.73_m2} (ref >59)
GFR, EST NON AFRICAN AMERICAN: 52 mL/min/{1.73_m2} — AB (ref >59)
Glucose: 101 mg/dL — ABNORMAL HIGH (ref 65–99)
POTASSIUM: 4.9 mmol/L (ref 3.5–5.2)
Sodium: 144 mmol/L (ref 134–144)

## 2017-12-18 LAB — HEPATIC FUNCTION PANEL
ALBUMIN: 4.1 g/dL (ref 3.5–4.8)
ALK PHOS: 98 IU/L (ref 39–117)
ALT: 33 IU/L (ref 0–44)
AST: 25 IU/L (ref 0–40)
BILIRUBIN TOTAL: 0.5 mg/dL (ref 0.0–1.2)
Bilirubin, Direct: 0.16 mg/dL (ref 0.00–0.40)
Total Protein: 6.7 g/dL (ref 6.0–8.5)

## 2017-12-18 LAB — PSA: Prostate Specific Ag, Serum: 0.3 ng/mL (ref 0.0–4.0)

## 2017-12-25 ENCOUNTER — Encounter: Payer: Medicare Other | Admitting: Family Medicine

## 2017-12-27 ENCOUNTER — Encounter: Payer: Self-pay | Admitting: Family Medicine

## 2017-12-27 ENCOUNTER — Ambulatory Visit (INDEPENDENT_AMBULATORY_CARE_PROVIDER_SITE_OTHER): Payer: Medicare Other | Admitting: Family Medicine

## 2017-12-27 VITALS — BP 122/78 | Ht 72.0 in | Wt 200.2 lb

## 2017-12-27 DIAGNOSIS — Z0000 Encounter for general adult medical examination without abnormal findings: Secondary | ICD-10-CM

## 2017-12-27 NOTE — Progress Notes (Signed)
Subjective:    Patient ID: Roger [name], male    DOB: [birth year], 72 y.o.   MRN: [id number]  HPI  AWV- Annual Wellness Visit  The patient was seen for their annual wellness visit. The patient's past medical history, surgical history, and family history were reviewed. Pertinent vaccines were reviewed ( tetanus, pneumonia, shingles, flu) The patient's medication list was reviewed and updated.  The height and weight were entered.  BMI recorded in electronic record elsewhere  Cognitive screening was completed. Outcome of Mini - Cog: pass   Falls /depression screening electronically recorded within record elsewhere  Current tobacco usage:none (All patients who use tobacco were given written and verbal information on quitting)  Recent listing of emergency department/hospitalizations over the past year were reviewed.  current specialist the patient sees on a regular basis: Dr Molli Knock and Dr Hall-dermatology   The care annual wellness visit patient questionnaire was reviewed.                                                                                                                                   She 1                                                                                                                                                                                                            Region history prior lipid problem usually  so  A written screening schedule for the patient for the next 5-10 years was given. Appropriate discussion of followup regarding next visit was discussed.  Results for orders placed or performed in visit on 12/16/17  Lipid panel  Result Value Ref Range   Cholesterol, Total 132 100 - 199 mg/dL   Triglycerides 77 0 - 149 mg/dL   HDL 45 >39 mg/dL   VLDL Cholesterol Cal 15 5 - 40 mg/dL   LDL Calculated 72 0 - 99 mg/dL   Chol/HDL Ratio 2.9  0.0 - 5.0 ratio  Hepatic function panel  Result Value Ref Range   Total Protein 6.7 6.0 - 8.5 g/dL   Albumin 4.1 3.5 - 4.8 g/dL   Bilirubin Total 0.5 0.0 - 1.2 mg/dL   Bilirubin, Direct 0.16 0.00 - 0.40 mg/dL   Alkaline Phosphatase 98 39 - 117 IU/L   AST 25 0 - 40 IU/L   ALT 33 0 - 44 IU/L  Basic metabolic panel  Result Value Ref Range   Glucose 101 (H) 65 - 99 mg/dL   BUN 19 8 - 27 mg/dL   Creatinine, Ser 1.36 (H) 0.76 - 1.27 mg/dL   GFR calc non Af Amer 52 (L) >59 mL/min/1.73   GFR calc Af Amer 60 >59 mL/min/1.73   BUN/Creatinine Ratio 14 10 - 24   Sodium 144 134 - 144 mmol/L   Potassium 4.9 3.5 - 5.2 mmol/L   Chloride 105 96 - 106 mmol/L   CO2 23 20 - 29 mmol/L   Calcium 9.2 8.6 - 10.2 mg/dL  PSA  Result Value Ref Range   Prostate Specific Ag, Serum 0.3 0.0 - 4.0 ng/mL    Exercise going well til recently  Not doing as much    Review of Systems  Constitutional: Negative for activity change, appetite change and fever.  HENT: Negative for congestion and rhinorrhea.   Eyes: Negative for discharge.  Respiratory: Negative for cough and wheezing.   Cardiovascular: Negative for chest pain.  Gastrointestinal: Negative for abdominal pain, blood in stool and vomiting.  Genitourinary: Negative for difficulty urinating and frequency.  Musculoskeletal: Negative for neck pain.  Skin: Negative for rash.  Allergic/Immunologic: Negative for environmental allergies and food allergies.  Neurological: Negative for weakness and headaches.  Psychiatric/Behavioral: Negative for agitation.  All other systems reviewed and are negative.      Objective:   Physical Exam  Constitutional: He appears well-developed and well-nourished.  HENT:  Head: Normocephalic and atraumatic.  Right Ear: External ear normal.  Left Ear: External ear normal.  Nose: Nose normal.  Mouth/Throat: Oropharynx is clear and moist.  Eyes: Right eye exhibits no discharge. Left eye exhibits no discharge. No  scleral icterus.  Neck: Normal range of motion. Neck supple. No thyromegaly present.  Cardiovascular: Normal rate, regular rhythm and normal heart sounds.  No murmur heard. Pulmonary/Chest: Effort normal and breath sounds normal. No respiratory distress. He has no wheezes.  Abdominal: Soft. Bowel sounds are normal. He exhibits no distension and no mass. There is no tenderness.  Genitourinary: Penis normal.  Genitourinary Comments: Prostate exam within normal limits  Musculoskeletal: Normal range of motion. He exhibits no edema.  Lymphadenopathy:    He has no cervical adenopathy.  Neurological: He is alert. He exhibits normal muscle tone. Coordination normal.  Skin: Skin is warm and dry. No erythema.  Multiple sebaceous keratoses  Psychiatric: He has a normal mood and affect. His behavior is normal. Judgment normal.  Vitals reviewed.  Assessment & Plan:  Impression #1 wellness exam.  Anticipatory guidance given.  Vaccines discussed.  She Grix prescription given.  Diet exercise discussed.  2.  Coronary artery disease with hypertension hyperlipidemia followed by specialist but discussed with patient.  Blood work reviewed/follow-up Arts administrator capture

## 2017-12-30 ENCOUNTER — Encounter: Payer: Self-pay | Admitting: Cardiovascular Disease

## 2017-12-30 ENCOUNTER — Ambulatory Visit (INDEPENDENT_AMBULATORY_CARE_PROVIDER_SITE_OTHER): Payer: Medicare Other | Admitting: Cardiovascular Disease

## 2017-12-30 VITALS — BP 138/70 | HR 70 | Ht 72.0 in | Wt 207.0 lb

## 2017-12-30 DIAGNOSIS — I209 Angina pectoris, unspecified: Secondary | ICD-10-CM | POA: Diagnosis not present

## 2017-12-30 DIAGNOSIS — I25118 Atherosclerotic heart disease of native coronary artery with other forms of angina pectoris: Secondary | ICD-10-CM | POA: Diagnosis not present

## 2017-12-30 DIAGNOSIS — I10 Essential (primary) hypertension: Secondary | ICD-10-CM | POA: Diagnosis not present

## 2017-12-30 DIAGNOSIS — Z955 Presence of coronary angioplasty implant and graft: Secondary | ICD-10-CM

## 2017-12-30 DIAGNOSIS — E785 Hyperlipidemia, unspecified: Secondary | ICD-10-CM | POA: Diagnosis not present

## 2017-12-30 NOTE — Progress Notes (Signed)
SUBJECTIVE: The patient presents for routine annual follow-up of coronary artery disease. He underwent drug-eluting stent placement to the LAD on 07/16/14. Coronary angiography demonstrated severe disease in the proximal to mid left anterior descending artery. This was successfully treated with a 3.0 x 24 promus drug-eluting stent. There was a large first diagonal which was jailed but continued to have TIMI-3 flow. He also had widely patent left circumflex artery and its branches and a patent RCA.  ECG performed in the office today which I personally reviewed demonstrates sinus rhythm with old anteroseptal infarct.  The patient denies any symptoms of chest pain, palpitations, shortness of breath, lightheadedness, dizziness, leg swelling, orthopnea, PND, and syncope.    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  He worked in the [employer] for 38 years.  His sister-in-law, Manus Gunning, is a Cabin crew in [place].   Review of Systems: As per "subjective", otherwise negative.  No Known Allergies  Current Outpatient Medications  Medication Sig Dispense Refill  . acetaminophen (TYLENOL) 500 MG tablet Take 500-1,000 mg by mouth every 6 (six) hours as needed for mild pain or headache.     Marland Kitchen aspirin 81 MG tablet Take 81 mg by mouth daily.    Marland Kitchen atorvastatin (LIPITOR) 20 MG tablet TAKE ONE TABLET BY MOUTH ONCE DAILY AT 6 PM 90 tablet 3  . cetirizine (ZYRTEC) 10 MG tablet Take 10 mg by mouth daily as needed for allergies.    Marland Kitchen lisinopril (PRINIVIL,ZESTRIL) 20 MG tablet TAKE ONE TABLET BY MOUTH ONCE DAILY 90 tablet 3  . metoprolol tartrate (LOPRESSOR) 25 MG tablet TAKE ONE-HALF TABLET BY MOUTH TWICE DAILY 90 tablet 3  . Multiple Vitamin (MULTIVITAMIN WITH MINERALS) TABS tablet Take 1 tablet by mouth daily.    . nitroGLYCERIN (NITROSTAT) 0.4 MG SL tablet DISSOLVE ONE TABLET UNDER THE TONGUE EVERY 5 MINUTES AS NEEDED FOR CHEST PAIN.  DO NOT EXCEED A TOTAL  OF 3 DOSES IN 15 MINUTES 25 tablet 3   No current facility-administered medications for this visit.     Past Medical History:  Diagnosis Date  . Adenomatous polyps   . Hypertension   . Impaired fasting glucose   . Seasonal allergies     Past Surgical History:  Procedure Laterality Date  . COLONOSCOPY  10/03/05   inflamed hyperplastic polyp/adenomatous poylp, left sided diverticula  . COLONOSCOPY  03/03/2012   Procedure: COLONOSCOPY;  Surgeon: Daneil Dolin, MD;  Location: [facility];  Service: Endoscopy;  Laterality: N/A;  10:15  . COLONOSCOPY N/A 04/11/2017   Procedure: COLONOSCOPY;  Surgeon: Daneil Dolin, MD;  Location: [facility];  Service: Endoscopy;  Laterality: N/A;  8:30 AM - moved to 5/24 @ 1:15  . CORONARY ANGIOPLASTY WITH STENT PLACEMENT  07/16/2014   "1"  . KNEE ARTHROSCOPY Right ~ 1994  . KNEE ARTHROSCOPY Left ~ 2001  . LEFT AND RIGHT HEART CATHETERIZATION WITH CORONARY ANGIOGRAM N/A 07/16/2014   Procedure: LEFT AND RIGHT HEART CATHETERIZATION WITH CORONARY ANGIOGRAM;  Surgeon: [name], MD;  Location: [facility] CATH LAB;  Service: Cardiovascular;  Laterality: N/A;  . POLYPECTOMY  04/11/2017   Procedure: POLYPECTOMY;  Surgeon: Daneil Dolin, MD;  Location: [facility];  Service: Endoscopy;;  colon    Social History   Socioeconomic History  . Marital status: Married    Spouse name: Not on file  . Number of children: Not on file  . Years of  education: Not on file  . Highest education level: Not on file  Social Needs  . Financial resource strain: Not on file  . Food insecurity - worry: Not on file  . Food insecurity - inability: Not on file  . Transportation needs - medical: Not on file  . Transportation needs - non-medical: Not on file  Occupational History  . Not on file  Tobacco Use  . Smoking status: Never Smoker  . Smokeless tobacco: Never Used  Substance and Sexual Activity  . Alcohol use: Yes    Alcohol/week: 1.2 oz    Types: 2 Glasses  of wine per week  . Drug use: No  . Sexual activity: Yes  Other Topics Concern  . Not on file  Social History Narrative  . Not on file     Vitals:   12/30/17 1034  BP: 138/70  Pulse: 70  SpO2: 99%  Weight: 207 lb (93.9 kg)  Height: 6' (1.829 m)    Wt Readings from Last 3 Encounters:  12/30/17 207 lb (93.9 kg)  12/27/17 200 lb 3.2 oz (90.8 kg)  04/11/17 195 lb (88.5 kg)     PHYSICAL EXAM General: NAD HEENT: Normal. Neck: No JVD, no thyromegaly. Lungs: Clear to auscultation bilaterally with normal respiratory effort. CV: Regular rate and rhythm, normal S1/S2, no S3/S4, no murmur. No pretibial or periankle edema.  No carotid bruit.   Abdomen: Soft, nontender, no distention.  Neurologic: Alert and oriented.  Psych: Normal affect. Skin: Normal. Musculoskeletal: No gross deformities.    ECG: Most recent ECG reviewed.   Labs: Lab Results  Component Value Date/Time   K 4.9 12/17/2017 08:14 AM   BUN 19 12/17/2017 08:14 AM   CREATININE 1.36 (H) 12/17/2017 08:14 AM   CREATININE 1.30 10/19/2014 07:43 AM   ALT 33 12/17/2017 08:14 AM   HGB 14.8 07/17/2014 04:27 AM     Lipids: Lab Results  Component Value Date/Time   LDLCALC 72 12/17/2017 08:14 AM   CHOL 132 12/17/2017 08:14 AM   TRIG 77 12/17/2017 08:14 AM   HDL 45 12/17/2017 08:14 AM       ASSESSMENT AND PLAN:  1. CAD s/p LAD DES: Stable ischemic heart disease. Continue aspirin, metoprolol, and statin therapy.   2.  Chronic hypertension: Controlled on present therapy.  No changes.  3. Hyperlipidemia: Lipid panel 12/17/17 demonstrated total cholesterol 132, triglycerides 77, HDL 45, LDL 72.  Continue statin therapy.     Disposition: Follow up 1 year   Kate Sable, M.D., F.A.C.C.

## 2017-12-30 NOTE — Patient Instructions (Signed)
Your physician wants you to follow-up in:  1 year with Dr.Koneswaran You will receive a reminder letter in the mail two months in advance. If you don't receive a letter, please call our office to schedule the follow-up appointment.    Your physician recommends that you continue on your current medications as directed. Please refer to the Current Medication list given to you today.    If you need a refill on your cardiac medications before your next appointment, please call your pharmacy.      No lab work or tests ordered today.      Thank you for choosing [facility] !        

## 2018-01-13 DIAGNOSIS — D225 Melanocytic nevi of trunk: Secondary | ICD-10-CM | POA: Diagnosis not present

## 2018-01-13 DIAGNOSIS — X32XXXD Exposure to sunlight, subsequent encounter: Secondary | ICD-10-CM | POA: Diagnosis not present

## 2018-01-13 DIAGNOSIS — L570 Actinic keratosis: Secondary | ICD-10-CM | POA: Diagnosis not present

## 2018-01-13 DIAGNOSIS — L821 Other seborrheic keratosis: Secondary | ICD-10-CM | POA: Diagnosis not present

## 2018-01-13 DIAGNOSIS — C44519 Basal cell carcinoma of skin of other part of trunk: Secondary | ICD-10-CM | POA: Diagnosis not present

## 2018-01-13 DIAGNOSIS — Z1283 Encounter for screening for malignant neoplasm of skin: Secondary | ICD-10-CM | POA: Diagnosis not present

## 2018-01-31 ENCOUNTER — Encounter: Payer: Self-pay | Admitting: Family Medicine

## 2018-01-31 ENCOUNTER — Ambulatory Visit (INDEPENDENT_AMBULATORY_CARE_PROVIDER_SITE_OTHER): Payer: Medicare Other | Admitting: Family Medicine

## 2018-01-31 VITALS — BP 148/76 | Temp 98.8°F | Ht 72.0 in | Wt 205.0 lb

## 2018-01-31 DIAGNOSIS — J329 Chronic sinusitis, unspecified: Secondary | ICD-10-CM

## 2018-01-31 DIAGNOSIS — I25118 Atherosclerotic heart disease of native coronary artery with other forms of angina pectoris: Secondary | ICD-10-CM

## 2018-01-31 MED ORDER — BENZONATATE 100 MG PO CAPS: 100.0000 mg | ORAL_CAPSULE | Freq: Four times a day (QID) | ORAL | 0 refills | Status: DC | PRN

## 2018-01-31 MED ORDER — CEFDINIR 300 MG PO CAPS: 300.0000 mg | ORAL_CAPSULE | Freq: Two times a day (BID) | ORAL | 0 refills | Status: DC

## 2018-01-31 NOTE — Progress Notes (Signed)
   Subjective:    Patient ID: Roger [name], male    DOB: 1945-12-20, 72 y.o.   MRN: [id number]  Sinusitis  This is a new problem. Episode onset: 6 weeks. Associated symptoms include coughing, headaches and a sore throat. Treatments tried: nyquil.   Pos cough hacking  Feels cong in the chest  Trying to clear in the trhoa   presure in the head,  laring        Review of Systems  HENT: Positive for sore throat.   Respiratory: Positive for cough.   Neurological: Positive for headaches.       Objective:   Physical Exam Alert, mild malaise. Hydration good Vitals stable. frontal/ maxillary tenderness evident positive nasal congestion. pharynx normal neck supple  lungs clear/no crackles or wheezes. heart regular in rhythm       Assessment & Plan:  Impression rhinosinusitis likely post viral, discussed with patient. plan antibiotics prescribed. Questions answered. Symptomatic care discussed. warning signs discussed. WSL

## 2018-02-05 DIAGNOSIS — L818 Other specified disorders of pigmentation: Secondary | ICD-10-CM | POA: Diagnosis not present

## 2018-02-05 DIAGNOSIS — L570 Actinic keratosis: Secondary | ICD-10-CM | POA: Diagnosis not present

## 2018-02-05 DIAGNOSIS — X32XXXD Exposure to sunlight, subsequent encounter: Secondary | ICD-10-CM | POA: Diagnosis not present

## 2018-02-05 DIAGNOSIS — Z85828 Personal history of other malignant neoplasm of skin: Secondary | ICD-10-CM | POA: Diagnosis not present

## 2018-02-05 DIAGNOSIS — Z08 Encounter for follow-up examination after completed treatment for malignant neoplasm: Secondary | ICD-10-CM | POA: Diagnosis not present

## 2018-04-09 ENCOUNTER — Other Ambulatory Visit: Payer: Self-pay | Admitting: Cardiovascular Disease

## 2018-04-14 ENCOUNTER — Other Ambulatory Visit: Payer: Self-pay | Admitting: Cardiovascular Disease

## 2018-07-28 ENCOUNTER — Encounter: Payer: Self-pay | Admitting: *Deleted

## 2018-07-28 ENCOUNTER — Other Ambulatory Visit: Payer: Self-pay | Admitting: Cardiovascular Disease

## 2018-08-05 ENCOUNTER — Ambulatory Visit (INDEPENDENT_AMBULATORY_CARE_PROVIDER_SITE_OTHER): Payer: Medicare Other | Admitting: Family Medicine

## 2018-08-05 ENCOUNTER — Encounter: Payer: Self-pay | Admitting: Family Medicine

## 2018-08-05 VITALS — BP 128/70 | Temp 98.4°F | Ht 72.0 in | Wt 205.6 lb

## 2018-08-05 DIAGNOSIS — S30861A Insect bite (nonvenomous) of abdominal wall, initial encounter: Secondary | ICD-10-CM

## 2018-08-05 DIAGNOSIS — W57XXXA Bitten or stung by nonvenomous insect and other nonvenomous arthropods, initial encounter: Secondary | ICD-10-CM

## 2018-08-05 DIAGNOSIS — I25118 Atherosclerotic heart disease of native coronary artery with other forms of angina pectoris: Secondary | ICD-10-CM

## 2018-08-05 DIAGNOSIS — R21 Rash and other nonspecific skin eruption: Secondary | ICD-10-CM

## 2018-08-05 MED ORDER — DOXYCYCLINE HYCLATE 100 MG PO TABS: 100.0000 mg | ORAL_TABLET | Freq: Two times a day (BID) | ORAL | 0 refills | Status: DC

## 2018-08-05 NOTE — Progress Notes (Signed)
   Subjective:    Patient ID: Roger [name], male    DOB: 1946/07/11, 72 y.o.   MRN: [id number]  HPI Pt here today due to finding a tick in groin area about 11 pm last night. Pt did remove tick. Has small red area. Has uses alcohol swab and ABT ointment.   Was out in the yard around   Earlier in the day  And then felt a little itchy and   scrathed then saw tick   Review of Systems No headache no fever no rash    Objective:   Physical Exam Alert vitals stable, NAD. Blood pressure good on repeat. HEENT normal. Lungs clear. Heart regular rate and rhythm. Small erythematous patch around papule at site of tick bite       Assessment & Plan:  Impression tick bite Long discussion held.  Chance of infection very low discussed/warning signs discussed.  Doxycycline prescribed patient to hold use if necessary

## 2018-09-02 DIAGNOSIS — Z23 Encounter for immunization: Secondary | ICD-10-CM | POA: Diagnosis not present

## 2018-09-29 DIAGNOSIS — X32XXXD Exposure to sunlight, subsequent encounter: Secondary | ICD-10-CM | POA: Diagnosis not present

## 2018-09-29 DIAGNOSIS — Z1283 Encounter for screening for malignant neoplasm of skin: Secondary | ICD-10-CM | POA: Diagnosis not present

## 2018-09-29 DIAGNOSIS — Z08 Encounter for follow-up examination after completed treatment for malignant neoplasm: Secondary | ICD-10-CM | POA: Diagnosis not present

## 2018-09-29 DIAGNOSIS — Z85828 Personal history of other malignant neoplasm of skin: Secondary | ICD-10-CM | POA: Diagnosis not present

## 2018-09-29 DIAGNOSIS — D225 Melanocytic nevi of trunk: Secondary | ICD-10-CM | POA: Diagnosis not present

## 2018-09-29 DIAGNOSIS — L570 Actinic keratosis: Secondary | ICD-10-CM | POA: Diagnosis not present

## 2018-10-24 ENCOUNTER — Telehealth: Payer: Self-pay | Admitting: Family Medicine

## 2018-10-24 DIAGNOSIS — I10 Essential (primary) hypertension: Secondary | ICD-10-CM

## 2018-10-24 DIAGNOSIS — Z125 Encounter for screening for malignant neoplasm of prostate: Secondary | ICD-10-CM

## 2018-10-24 DIAGNOSIS — E785 Hyperlipidemia, unspecified: Secondary | ICD-10-CM

## 2018-10-24 NOTE — Telephone Encounter (Signed)
Pt has CPE scheduled for 12/29/18. Pt would like to have his lab work ordered to have it done a week or two before appt.

## 2018-10-24 NOTE — Telephone Encounter (Signed)
Last had Psa,Bmet,hepatic,lipid 12/17/2017.Same ? Please advise.

## 2018-10-25 ENCOUNTER — Other Ambulatory Visit: Payer: Self-pay | Admitting: Cardiovascular Disease

## 2018-10-26 NOTE — Telephone Encounter (Signed)
Sure

## 2018-10-27 ENCOUNTER — Other Ambulatory Visit: Payer: Self-pay | Admitting: Cardiovascular Disease

## 2018-10-27 MED ORDER — NITROGLYCERIN 0.4 MG SL SUBL: SUBLINGUAL_TABLET | SUBLINGUAL | 3 refills | Status: DC

## 2018-10-27 NOTE — Telephone Encounter (Signed)
Blood work ordered in Epic. Patient notified and verbalized understanding. ?

## 2018-10-27 NOTE — Telephone Encounter (Signed)
nitroGLYCERIN (NITROSTAT) 0.4 MG SL tablet [[id number]]   Needing refill sent to [facility]

## 2018-11-13 ENCOUNTER — Other Ambulatory Visit: Payer: Self-pay

## 2018-11-13 MED ORDER — NITROGLYCERIN 0.4 MG SL SUBL: SUBLINGUAL_TABLET | SUBLINGUAL | 1 refills | Status: DC

## 2018-11-13 NOTE — Telephone Encounter (Signed)
Fax request for #75 NTG 0.4 mg SL, e-scribed

## 2018-11-14 ENCOUNTER — Encounter: Payer: Self-pay | Admitting: Family Medicine

## 2018-11-14 ENCOUNTER — Ambulatory Visit (INDEPENDENT_AMBULATORY_CARE_PROVIDER_SITE_OTHER): Payer: Medicare Other | Admitting: Family Medicine

## 2018-11-14 VITALS — Temp 97.5°F | Wt 211.4 lb

## 2018-11-14 DIAGNOSIS — J069 Acute upper respiratory infection, unspecified: Secondary | ICD-10-CM

## 2018-11-14 DIAGNOSIS — I25118 Atherosclerotic heart disease of native coronary artery with other forms of angina pectoris: Secondary | ICD-10-CM | POA: Diagnosis not present

## 2018-11-14 NOTE — Progress Notes (Signed)
   Subjective:    Patient ID: Roger [name], male    DOB: 1946-10-28, 72 y.o.   MRN: [id number]  Cough  This is a new problem. The current episode started in the past 7 days. The cough is non-productive. Associated symptoms include ear pain, headaches, rhinorrhea and a sore throat. Pertinent negatives include no chest pain, chills, fever or wheezing. Treatments tried: tylenol. The treatment provided mild relief.   Viral-like illness for the past few days with a little bit of runny nose sore throat little bit of coughing slight headache denies any severe body pains denies high fever chills sweats   Review of Systems  Constitutional: Negative for activity change, chills and fever.  HENT: Positive for congestion, ear pain, rhinorrhea and sore throat.   Eyes: Negative for discharge.  Respiratory: Positive for cough. Negative for wheezing.   Cardiovascular: Negative for chest pain.  Gastrointestinal: Negative for nausea and vomiting.  Musculoskeletal: Negative for arthralgias.  Neurological: Positive for headaches.       Objective:   Physical Exam Vitals signs and nursing note reviewed.  Constitutional:      Appearance: He is well-developed.  HENT:     Head: Normocephalic.     Mouth/Throat:     Pharynx: No oropharyngeal exudate.  Neck:     Musculoskeletal: Normal range of motion.  Cardiovascular:     Rate and Rhythm: Normal rate and regular rhythm.     Heart sounds: Normal heart sounds. No murmur.  Pulmonary:     Effort: Pulmonary effort is normal.     Breath sounds: Normal breath sounds. No wheezing.  Lymphadenopathy:     Cervical: No cervical adenopathy.  Skin:    General: Skin is warm and dry.  Neurological:     Motor: No abnormal muscle tone.           Assessment & Plan:  Viral-like illness Supportive measures discussed Follow-up if progressive troubles or worse Warning signs discussed Hold off on any antibiotics currently Call us back next week if not  improving

## 2018-12-19 DIAGNOSIS — E785 Hyperlipidemia, unspecified: Secondary | ICD-10-CM | POA: Diagnosis not present

## 2018-12-19 DIAGNOSIS — Z125 Encounter for screening for malignant neoplasm of prostate: Secondary | ICD-10-CM | POA: Diagnosis not present

## 2018-12-19 DIAGNOSIS — I10 Essential (primary) hypertension: Secondary | ICD-10-CM | POA: Diagnosis not present

## 2018-12-20 LAB — LIPID PANEL
CHOL/HDL RATIO: 2.6 ratio (ref 0.0–5.0)
Cholesterol, Total: 141 mg/dL (ref 100–199)
HDL: 54 mg/dL (ref >39)
LDL CALC: 66 mg/dL (ref 0–99)
Triglycerides: 107 mg/dL (ref 0–149)
VLDL CHOLESTEROL CAL: 21 mg/dL (ref 5–40)

## 2018-12-20 LAB — HEPATIC FUNCTION PANEL
ALBUMIN: 4.3 g/dL (ref 3.7–4.7)
ALT: 32 IU/L (ref 0–44)
AST: 25 IU/L (ref 0–40)
Alkaline Phosphatase: 89 IU/L (ref 39–117)
BILIRUBIN TOTAL: 0.5 mg/dL (ref 0.0–1.2)
Bilirubin, Direct: 0.11 mg/dL (ref 0.00–0.40)
TOTAL PROTEIN: 6.8 g/dL (ref 6.0–8.5)

## 2018-12-20 LAB — BASIC METABOLIC PANEL
BUN / CREAT RATIO: 13 (ref 10–24)
BUN: 19 mg/dL (ref 8–27)
CHLORIDE: 103 mmol/L (ref 96–106)
CO2: 23 mmol/L (ref 20–29)
Calcium: 9.4 mg/dL (ref 8.6–10.2)
Creatinine, Ser: 1.45 mg/dL — ABNORMAL HIGH (ref 0.76–1.27)
GFR calc Af Amer: 55 mL/min/{1.73_m2} — ABNORMAL LOW (ref >59)
GFR calc non Af Amer: 48 mL/min/{1.73_m2} — ABNORMAL LOW (ref >59)
Glucose: 97 mg/dL (ref 65–99)
Potassium: 4.8 mmol/L (ref 3.5–5.2)
SODIUM: 143 mmol/L (ref 134–144)

## 2018-12-20 LAB — PSA: PROSTATE SPECIFIC AG, SERUM: 0.4 ng/mL (ref 0.0–4.0)

## 2018-12-29 ENCOUNTER — Encounter: Payer: Self-pay | Admitting: Family Medicine

## 2018-12-29 ENCOUNTER — Ambulatory Visit (INDEPENDENT_AMBULATORY_CARE_PROVIDER_SITE_OTHER): Payer: Medicare Other | Admitting: Family Medicine

## 2018-12-29 VITALS — BP 118/74 | Ht 71.75 in | Wt 206.8 lb

## 2018-12-29 DIAGNOSIS — Z0000 Encounter for general adult medical examination without abnormal findings: Secondary | ICD-10-CM

## 2018-12-29 NOTE — Patient Instructions (Signed)
Results for orders placed or performed in visit on 10/24/18  PSA  Result Value Ref Range   Prostate Specific Ag, Serum 0.4 0.0 - 4.0 ng/mL  Basic metabolic panel  Result Value Ref Range   Glucose 97 65 - 99 mg/dL   BUN 19 8 - 27 mg/dL   Creatinine, Ser 1.45 (H) 0.76 - 1.27 mg/dL   GFR calc non Af Amer 48 (L) >59 mL/min/1.73   GFR calc Af Amer 55 (L) >59 mL/min/1.73   BUN/Creatinine Ratio 13 10 - 24   Sodium 143 134 - 144 mmol/L   Potassium 4.8 3.5 - 5.2 mmol/L   Chloride 103 96 - 106 mmol/L   CO2 23 20 - 29 mmol/L   Calcium 9.4 8.6 - 10.2 mg/dL  Hepatic function panel  Result Value Ref Range   Total Protein 6.8 6.0 - 8.5 g/dL   Albumin 4.3 3.7 - 4.7 g/dL   Bilirubin Total 0.5 0.0 - 1.2 mg/dL   Bilirubin, Direct 0.11 0.00 - 0.40 mg/dL   Alkaline Phosphatase 89 39 - 117 IU/L   AST 25 0 - 40 IU/L   ALT 32 0 - 44 IU/L  Lipid panel  Result Value Ref Range   Cholesterol, Total 141 100 - 199 mg/dL   Triglycerides 107 0 - 149 mg/dL   HDL 54 >39 mg/dL   VLDL Cholesterol Cal 21 5 - 40 mg/dL   LDL Calculated 66 0 - 99 mg/dL   Chol/HDL Ratio 2.6 0.0 - 5.0 ratio

## 2018-12-29 NOTE — Progress Notes (Signed)
Subjective:    Patient ID: Roger [name], male    DOB: 1946/09/10, 73 y.o.   MRN: [id number]  HPI AWV- Annual Wellness Visit  The patient was seen for their annual wellness visit. The patient's past medical history, surgical history, and family history were reviewed. Pertinent vaccines were reviewed ( tetanus, pneumonia, shingles, flu) The patient's medication list was reviewed and updated.  The height and weight were entered.  BMI recorded in electronic record elsewhere  Cognitive screening was completed. Outcome of Mini - Cog: pass   Falls /depression screening electronically recorded within record elsewhere  Current tobacco usage: none (All patients who use tobacco were given written and verbal information on quitting)  Recent listing of emergency department/hospitalizations over the past year were reviewed.  current specialist the patient sees on a regular basis: none   Medicare annual wellness visit patient questionnaire was reviewed.  A written screening schedule for the patient for the next 5-10 years was given. Appropriate discussion of followup regarding next visit was discussed.  fam h of breast cancer    Results for orders placed or performed in visit on 10/24/18  PSA  Result Value Ref Range   Prostate Specific Ag, Serum 0.4 0.0 - 4.0 ng/mL  Basic metabolic panel  Result Value Ref Range   Glucose 97 65 - 99 mg/dL   BUN 19 8 - 27 mg/dL   Creatinine, Ser 1.45 (H) 0.76 - 1.27 mg/dL   GFR calc non Af Amer 48 (L) >59 mL/min/1.73   GFR calc Af Amer 55 (L) >59 mL/min/1.73   BUN/Creatinine Ratio 13 10 - 24   Sodium 143 134 - 144 mmol/L   Potassium 4.8 3.5 - 5.2 mmol/L   Chloride 103 96 - 106 mmol/L   CO2 23 20 - 29 mmol/L   Calcium 9.4 8.6 - 10.2 mg/dL  Hepatic function panel  Result Value Ref Range   Total Protein 6.8 6.0 - 8.5 g/dL   Albumin 4.3 3.7 - 4.7 g/dL   Bilirubin Total 0.5 0.0 - 1.2 mg/dL   Bilirubin, Direct 0.11 0.00 - 0.40 mg/dL   Alkaline  Phosphatase 89 39 - 117 IU/L   AST 25 0 - 40 IU/L   ALT 32 0 - 44 IU/L  Lipid panel  Result Value Ref Range   Cholesterol, Total 141 100 - 199 mg/dL   Triglycerides 107 0 - 149 mg/dL   HDL 54 >39 mg/dL   VLDL Cholesterol Cal 21 5 - 40 mg/dL   LDL Calculated 66 0 - 99 mg/dL   Chol/HDL Ratio 2.6 0.0 - 5.0 ratio   eslking five diay sper wk, at leat        Review of Systems  Constitutional: Negative for activity change, appetite change and fever.  HENT: Negative for congestion and rhinorrhea.   Eyes: Negative for discharge.  Respiratory: Negative for cough and wheezing.   Cardiovascular: Negative for chest pain.  Gastrointestinal: Negative for abdominal pain, blood in stool and vomiting.  Genitourinary: Negative for difficulty urinating and frequency.  Musculoskeletal: Negative for neck pain.  Skin: Negative for rash.  Allergic/Immunologic: Negative for environmental allergies and food allergies.  Neurological: Negative for weakness and headaches.  Psychiatric/Behavioral: Negative for agitation.  All other systems reviewed and are negative.      Objective:   Physical Exam Vitals signs reviewed.  Constitutional:      Appearance: He is well-developed.  HENT:     Head: Normocephalic and atraumatic.  Right Ear: External ear normal.     Left Ear: External ear normal.     Nose: Nose normal.  Eyes:     Pupils: Pupils are equal, round, and reactive to light.  Neck:     Musculoskeletal: Normal range of motion and neck supple.     Thyroid: No thyromegaly.  Cardiovascular:     Rate and Rhythm: Normal rate and regular rhythm.     Heart sounds: Normal heart sounds. No murmur.  Pulmonary:     Effort: Pulmonary effort is normal. No respiratory distress.     Breath sounds: Normal breath sounds. No wheezing.  Abdominal:     General: Bowel sounds are normal. There is no distension.     Palpations: Abdomen is soft. There is no mass.     Tenderness: There is no abdominal  tenderness.  Genitourinary:    Penis: Normal.      Prostate: Normal.  Musculoskeletal: Normal range of motion.  Lymphadenopathy:     Cervical: No cervical adenopathy.  Skin:    General: Skin is warm and dry.     Findings: No erythema.  Neurological:     Mental Status: He is alert.     Motor: No abnormal muscle tone.  Psychiatric:        Behavior: Behavior normal.        Judgment: Judgment normal.           Assessment & Plan:  Impression wellness. Colon last don twio yrs ago, next due in three moe yrs.  Diet discussed.  Exercise discussed.  Blood work reviewed.  Up-to-date on vaccinations.  2.  Coronary artery disease.  Followed by specialist along with her medications.  3.  Hypertension  4.  Hyperlipidemia.  5.  Stage II chronic renal disease.  Discussed.  Will follow yearly  Follow-up yearly or sooner if needed.

## 2019-01-12 ENCOUNTER — Encounter: Payer: Self-pay | Admitting: Cardiovascular Disease

## 2019-01-12 ENCOUNTER — Ambulatory Visit (INDEPENDENT_AMBULATORY_CARE_PROVIDER_SITE_OTHER): Payer: Medicare Other | Admitting: Cardiovascular Disease

## 2019-01-12 VITALS — BP 118/64 | HR 65 | Ht 72.0 in | Wt 209.0 lb

## 2019-01-12 DIAGNOSIS — E785 Hyperlipidemia, unspecified: Secondary | ICD-10-CM | POA: Diagnosis not present

## 2019-01-12 DIAGNOSIS — I10 Essential (primary) hypertension: Secondary | ICD-10-CM | POA: Diagnosis not present

## 2019-01-12 DIAGNOSIS — Z955 Presence of coronary angioplasty implant and graft: Secondary | ICD-10-CM | POA: Diagnosis not present

## 2019-01-12 DIAGNOSIS — I25118 Atherosclerotic heart disease of native coronary artery with other forms of angina pectoris: Secondary | ICD-10-CM | POA: Diagnosis not present

## 2019-01-12 NOTE — Patient Instructions (Signed)
Medication Instructions:  Your physician recommends that you continue on your current medications as directed. Please refer to the Current Medication list given to you today.  If you need a refill on your cardiac medications before your next appointment, please call your pharmacy.   Lab work: None today If you have labs (blood work) drawn today and your tests are completely normal, you will receive your results only by: Marland Kitchen MyChart Message (if you have MyChart) OR . A paper copy in the mail If you have any lab test that is abnormal or we need to change your treatment, we will call you to review the results.  Testing/Procedures: None today  Follow-Up: At [facility], you and your health needs are our priority.  As part of our continuing mission to provide you with exceptional heart care, we have created designated Provider Care Teams.  These Care Teams include your primary Cardiologist (physician) and Advanced Practice Providers (APPs -  Physician Assistants and Nurse Practitioners) who all work together to provide you with the care you need, when you need it. You will need a follow up appointment in 12 months.  Please call our office 2 months in advance to schedule this appointment.  You may see Dr.Koneswaran or one of the following Advanced Practice Providers on your designated Care Team:   [place], PA-C ([place]) . Ermalinda Barrios, PA-C ([place])  Any Other Special Instructions Will Be Listed Below (If Applicable). none

## 2019-01-12 NOTE — Progress Notes (Signed)
SUBJECTIVE: The patient presents for routine annual follow-up of coronary artery disease. He underwent drug-eluting stent placement to the LAD on 07/16/14. Coronary angiography demonstrated severe disease in the proximal to mid left anterior descending artery. This was successfully treated with a 3.0 x 24 promus drug-eluting stent. There was a large first diagonal which was jailed but continued to have TIMI-3 flow. He also had widely patent left circumflex artery and its branches and a patent RCA.  The patient denies any symptoms of chest pain, palpitations, shortness of breath, lightheadedness, dizziness, leg swelling, orthopnea, PND, and syncope.  He has been doing quite a bit of traveling.  Last year he went to [place] and is going to the [place] with his family and also on a [place] cruise this year.  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  He worked in the [employer] for 38 years.  His sister-in-law, Manus Gunning, is a Cabin crew in [place].  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  Review of Systems: As per "subjective", otherwise negative.  No Known Allergies  Current Outpatient Medications  Medication Sig Dispense Refill  . acetaminophen (TYLENOL) 500 MG tablet Take 500-1,000 mg by mouth every 6 (six) hours as needed for mild pain or headache.     Marland Kitchen aspirin 81 MG tablet Take 81 mg by mouth daily.    Marland Kitchen atorvastatin (LIPITOR) 20 MG tablet TAKE 1 TABLET BY MOUTH ONCE DAILY AT 6 PM 90 tablet 2  . cetirizine (ZYRTEC) 10 MG tablet Take 10 mg by mouth daily as needed for allergies.    Marland Kitchen lisinopril (PRINIVIL,ZESTRIL) 20 MG tablet TAKE 1 TABLET BY MOUTH DAILY 90 tablet 3  . metoprolol tartrate (LOPRESSOR) 25 MG tablet TAKE 1/2 TABLETS BY MOUTH TWICE DAILY 90 tablet 3  . Multiple Vitamin (MULTIVITAMIN WITH MINERALS) TABS tablet Take 1 tablet by mouth daily.    .  nitroGLYCERIN (NITROSTAT) 0.4 MG SL tablet DISSOLVE ONE TABLET UNDER THE TONGUE EVERY 5 MINUTES AS NEEDED FOR CHEST PAIN.  DO NOT EXCEED A TOTAL OF 3 DOSES IN 15 MINUTES 75 tablet 1   No current facility-administered medications for this visit.     Past Medical History:  Diagnosis Date  . Adenomatous polyps   . Hypertension   . Impaired fasting glucose   . Seasonal allergies     Past Surgical History:  Procedure Laterality Date  . COLONOSCOPY  10/03/05   inflamed hyperplastic polyp/adenomatous poylp, left sided diverticula  . COLONOSCOPY  03/03/2012   Procedure: COLONOSCOPY;  Surgeon: Daneil Dolin, MD;  [facility];  Service: Endoscopy;  Laterality: N/A;  10:15  . COLONOSCOPY N/A 04/11/2017   Procedure: COLONOSCOPY;  Surgeon: Daneil Dolin, MD;  [facility];  Service: Endoscopy;  Laterality: N/A;  8:30 AM - moved to 5/24 @ 1:15  . CORONARY ANGIOPLASTY WITH STENT PLACEMENT  07/16/2014   "1"  . KNEE ARTHROSCOPY Right ~ 1994  . KNEE ARTHROSCOPY Left ~ 2001  . LEFT AND RIGHT HEART CATHETERIZATION WITH CORONARY ANGIOGRAM N/A 07/16/2014   Procedure: LEFT AND RIGHT HEART CATHETERIZATION WITH CORONARY ANGIOGRAM;  Surgeon: [name], MD;  Location: [facility] CATH LAB;  Service: Cardiovascular;  Laterality: N/A;  . POLYPECTOMY  04/11/2017   Procedure: POLYPECTOMY;  Surgeon: Daneil Dolin, MD;  [facility];  Service: Endoscopy;;  colon    Social History   Socioeconomic History  . Marital status: Married    Spouse name: Not on file  . Number of children: Not on file  . Years of education: Not on file  . Highest education level: Not on file  Occupational History  . Not on file  Social Needs  . Financial resource strain: Not on file  . Food insecurity:    Worry: Not on file    Inability: Not on file  . Transportation needs:    Medical: Not on file    Non-medical: Not on file  Tobacco Use  . Smoking status: Never Smoker  . Smokeless tobacco:  Never Used  Substance and Sexual Activity  . Alcohol use: Yes    Alcohol/week: 2.0 standard drinks    Types: 2 Glasses of wine per week  . Drug use: No  . Sexual activity: Yes  Lifestyle  . Physical activity:    Days per week: Not on file    Minutes per session: Not on file  . Stress: Not on file  Relationships  . Social connections:    Talks on phone: Not on file    Gets together: Not on file    Attends religious service: Not on file    Active member of club or organization: Not on file    Attends meetings of clubs or organizations: Not on file    Relationship status: Not on file  . Intimate partner violence:    Fear of current or ex partner: Not on file    Emotionally abused: Not on file    Physically abused: Not on file    Forced sexual activity: Not on file  Other Topics Concern  . Not on file  Social History Narrative  . Not on file     Vitals:   01/12/19 0957  BP: 118/64  Pulse: 65  SpO2: 98%  Weight: 209 lb (94.8 kg)  Height: 6' (1.829 m)    Wt Readings from Last 3 Encounters:  01/12/19 209 lb (94.8 kg)  12/29/18 206 lb 12.8 oz (93.8 kg)  11/14/18 211 lb 6.4 oz (95.9 kg)     PHYSICAL EXAM General: NAD HEENT: Normal. Neck: No JVD, no thyromegaly. Lungs: Clear to auscultation bilaterally with normal respiratory effort. CV: Regular rate and rhythm, normal S1/S2, no S3/S4, no murmur. No pretibial or periankle edema.  No carotid bruit.   Abdomen: Soft, nontender, no distention.  Neurologic: Alert and oriented.  Psych: Normal affect. Skin: Normal. Musculoskeletal: No gross deformities.    ECG: Reviewed above under Subjective   Labs: Lab Results  Component Value Date/Time   K 4.8 12/19/2018 08:08 AM   BUN 19 12/19/2018 08:08 AM   CREATININE 1.45 (H) 12/19/2018 08:08 AM   CREATININE 1.30 10/19/2014 07:43 AM   ALT 32 12/19/2018 08:08 AM   HGB 14.8 07/17/2014 04:27 AM     Lipids: Lab Results  Component Value Date/Time   LDLCALC 66 12/19/2018  08:08 AM   CHOL 141 12/19/2018 08:08 AM   TRIG 107 12/19/2018 08:08 AM   HDL 54 12/19/2018 08:08 AM       ASSESSMENT AND PLAN: 1. CAD s/p LAD DES: Stable ischemic heart disease. Continue aspirin, metoprolol, and statin therapy.   2.  Chronic hypertension: Controlled on present therapy.  No changes.  3. Hyperlipidemia:  Lipid panel from 12/19/2018 reviewed above.  LDL at goal, 66.  Continue statin therapy.    Disposition: Follow up 1 year  Kate Sable, M.D., F.A.C.C.

## 2019-04-17 ENCOUNTER — Other Ambulatory Visit: Payer: Self-pay

## 2019-04-17 MED ORDER — METOPROLOL TARTRATE 25 MG PO TABS: ORAL_TABLET | ORAL | 3 refills | Status: DC

## 2019-04-17 NOTE — Telephone Encounter (Signed)
Refilled metoprolol 

## 2019-06-18 ENCOUNTER — Encounter: Payer: Self-pay | Admitting: Family Medicine

## 2019-07-21 ENCOUNTER — Other Ambulatory Visit: Payer: Self-pay

## 2019-07-21 MED ORDER — ATORVASTATIN CALCIUM 20 MG PO TABS: ORAL_TABLET | ORAL | 2 refills | Status: DC

## 2019-07-21 MED ORDER — LISINOPRIL 20 MG PO TABS: 20.0000 mg | ORAL_TABLET | Freq: Every day | ORAL | 3 refills | Status: DC

## 2019-08-20 DIAGNOSIS — Z23 Encounter for immunization: Secondary | ICD-10-CM | POA: Diagnosis not present

## 2019-11-06 ENCOUNTER — Encounter: Payer: Self-pay | Admitting: Family Medicine

## 2019-11-09 ENCOUNTER — Telehealth: Payer: Self-pay | Admitting: Family Medicine

## 2019-11-09 DIAGNOSIS — Z0000 Encounter for general adult medical examination without abnormal findings: Secondary | ICD-10-CM

## 2019-11-09 DIAGNOSIS — E785 Hyperlipidemia, unspecified: Secondary | ICD-10-CM

## 2019-11-09 DIAGNOSIS — I10 Essential (primary) hypertension: Secondary | ICD-10-CM

## 2019-11-09 DIAGNOSIS — Z125 Encounter for screening for malignant neoplasm of prostate: Secondary | ICD-10-CM

## 2019-11-09 NOTE — Telephone Encounter (Signed)
Pt.notified

## 2019-11-09 NOTE — Telephone Encounter (Signed)
Bw orders put in and left message for pt to return call to notify him.

## 2019-11-09 NOTE — Telephone Encounter (Signed)
same

## 2019-11-09 NOTE — Telephone Encounter (Signed)
Pt has CPE on 12/31/19. Pt would like to have lab work done before appt.

## 2019-11-09 NOTE — Telephone Encounter (Signed)
Last labs 12/19/18 lipid, liver, bmp, psa

## 2019-11-10 ENCOUNTER — Other Ambulatory Visit: Payer: Medicare Other

## 2019-11-11 ENCOUNTER — Telehealth: Payer: Self-pay | Admitting: Family Medicine

## 2019-11-11 NOTE — Telephone Encounter (Signed)
Discussed with pt and pt verbalized understanding.  °

## 2019-11-11 NOTE — Telephone Encounter (Signed)
Nurses Please communicate with patient With doing the additional tests that is good.  He should continue to stay relatively isolated until test results comes back  if that test comes back negative and has been greater than  10 days he could be around others  Moving forward continue to be cautious when around people not immediately in his house

## 2019-11-11 NOTE — Telephone Encounter (Signed)
Pt states he has had no symptoms this whole time and feels good.

## 2019-11-11 NOTE — Telephone Encounter (Signed)
Nurses please see my chart message

## 2019-11-12 ENCOUNTER — Ambulatory Visit: Payer: Medicare Other | Attending: Internal Medicine

## 2019-11-12 ENCOUNTER — Other Ambulatory Visit: Payer: Self-pay

## 2019-11-12 DIAGNOSIS — Z20822 Contact with and (suspected) exposure to covid-19: Secondary | ICD-10-CM

## 2019-11-14 LAB — NOVEL CORONAVIRUS, NAA: SARS-CoV-2, NAA: NOT DETECTED

## 2019-12-10 DIAGNOSIS — Z23 Encounter for immunization: Secondary | ICD-10-CM | POA: Diagnosis not present

## 2019-12-24 ENCOUNTER — Encounter: Payer: Self-pay | Admitting: Family Medicine

## 2019-12-28 DIAGNOSIS — E785 Hyperlipidemia, unspecified: Secondary | ICD-10-CM | POA: Diagnosis not present

## 2019-12-28 DIAGNOSIS — Z0000 Encounter for general adult medical examination without abnormal findings: Secondary | ICD-10-CM | POA: Diagnosis not present

## 2019-12-28 DIAGNOSIS — Z125 Encounter for screening for malignant neoplasm of prostate: Secondary | ICD-10-CM | POA: Diagnosis not present

## 2019-12-28 DIAGNOSIS — I10 Essential (primary) hypertension: Secondary | ICD-10-CM | POA: Diagnosis not present

## 2019-12-29 LAB — LIPID PANEL
Chol/HDL Ratio: 2.6 ratio (ref 0.0–5.0)
Cholesterol, Total: 143 mg/dL (ref 100–199)
HDL: 55 mg/dL (ref >39)
LDL Chol Calc (NIH): 69 mg/dL (ref 0–99)
Triglycerides: 105 mg/dL (ref 0–149)
VLDL Cholesterol Cal: 19 mg/dL (ref 5–40)

## 2019-12-29 LAB — BASIC METABOLIC PANEL
BUN/Creatinine Ratio: 13 (ref 10–24)
BUN: 21 mg/dL (ref 8–27)
CO2: 22 mmol/L (ref 20–29)
Calcium: 9.4 mg/dL (ref 8.6–10.2)
Chloride: 104 mmol/L (ref 96–106)
Creatinine, Ser: 1.68 mg/dL — ABNORMAL HIGH (ref 0.76–1.27)
GFR calc Af Amer: 46 mL/min/{1.73_m2} — ABNORMAL LOW (ref >59)
GFR calc non Af Amer: 40 mL/min/{1.73_m2} — ABNORMAL LOW (ref >59)
Glucose: 111 mg/dL — ABNORMAL HIGH (ref 65–99)
Potassium: 4.4 mmol/L (ref 3.5–5.2)
Sodium: 143 mmol/L (ref 134–144)

## 2019-12-29 LAB — HEPATIC FUNCTION PANEL
ALT: 27 IU/L (ref 0–44)
AST: 22 IU/L (ref 0–40)
Albumin: 4.6 g/dL (ref 3.7–4.7)
Alkaline Phosphatase: 103 IU/L (ref 39–117)
Bilirubin Total: 0.6 mg/dL (ref 0.0–1.2)
Bilirubin, Direct: 0.17 mg/dL (ref 0.00–0.40)
Total Protein: 7 g/dL (ref 6.0–8.5)

## 2019-12-29 LAB — PSA: Prostate Specific Ag, Serum: 0.5 ng/mL (ref 0.0–4.0)

## 2019-12-31 ENCOUNTER — Ambulatory Visit: Payer: Medicare Other | Admitting: Family Medicine

## 2020-01-01 ENCOUNTER — Encounter: Payer: Self-pay | Admitting: Family Medicine

## 2020-01-05 ENCOUNTER — Encounter: Payer: Self-pay | Admitting: Cardiovascular Disease

## 2020-01-05 ENCOUNTER — Ambulatory Visit: Payer: Medicare Other | Admitting: Cardiovascular Disease

## 2020-01-05 ENCOUNTER — Ambulatory Visit (INDEPENDENT_AMBULATORY_CARE_PROVIDER_SITE_OTHER): Payer: Medicare Other | Admitting: Cardiovascular Disease

## 2020-01-05 VITALS — BP 141/77 | HR 61 | Temp 98.9°F | Ht 72.0 in | Wt 206.0 lb

## 2020-01-05 DIAGNOSIS — I25118 Atherosclerotic heart disease of native coronary artery with other forms of angina pectoris: Secondary | ICD-10-CM

## 2020-01-05 DIAGNOSIS — E785 Hyperlipidemia, unspecified: Secondary | ICD-10-CM | POA: Diagnosis not present

## 2020-01-05 DIAGNOSIS — I10 Essential (primary) hypertension: Secondary | ICD-10-CM

## 2020-01-05 DIAGNOSIS — Z955 Presence of coronary angioplasty implant and graft: Secondary | ICD-10-CM

## 2020-01-05 NOTE — Patient Instructions (Signed)
Medication Instructions:  Your physician recommends that you continue on your current medications as directed. Please refer to the Current Medication list given to you today.  *If you need a refill on your cardiac medications before your next appointment, please call your pharmacy*  Lab Work: None today If you have labs (blood work) drawn today and your tests are completely normal, you will receive your results only by: . MyChart Message (if you have MyChart) OR . A paper copy in the mail If you have any lab test that is abnormal or we need to change your treatment, we will call you to review the results.  Testing/Procedures: None today  Follow-Up: At [facility], you and your health needs are our priority.  As part of our continuing mission to provide you with exceptional heart care, we have created designated Provider Care Teams.  These Care Teams include your primary Cardiologist (physician) and Advanced Practice Providers (APPs -  Physician Assistants and Nurse Practitioners) who all work together to provide you with the care you need, when you need it.  Your next appointment:   12 months  The format for your next appointment:   In Person  Provider:   Suresh Koneswaran, MD  Other Instructions None      Thank you for choosing [facility] !         

## 2020-01-05 NOTE — Progress Notes (Signed)
SUBJECTIVE: The patient presents for routine annual follow-up of coronary artery disease. He underwent drug-eluting stent placement to the LAD on 07/16/14. Coronary angiography demonstrated severe disease in the proximal to mid left anterior descending artery. This was successfully treated with a 3.0 x 24 promus drug-eluting stent. There was a large first diagonal which was jailed but continued to have TIMI-3 flow. He also had widely patent left circumflex artery and its branches and a patent RCA.  The patient denies any symptoms of chest pain, palpitations, shortness of breath, lightheadedness, dizziness, leg swelling, orthopnea, PND, and syncope.  ECG performed in the office today which I ordered and personally interpreted demonstrates normal sinus rhythm with no ischemic ST segment or T-wave abnormalities, nor any arrhythmias.  His sister passed away in 26-Sep-2019 and he is the executor of her estate.  He has been busy with this traveling to [facility] frequently.  He plans to visit the [place] in the fall and also has planned a trip to the [place]   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 He worked in the [employer] for 38 years. His sister-in-law, Manus Gunning, is a Cabin crew in [place].  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  Review of Systems: As per "subjective", otherwise negative.  No Known Allergies  Current Outpatient Medications  Medication Sig Dispense Refill  . acetaminophen (TYLENOL) 500 MG tablet Take 500-1,000 mg by mouth every 6 (six) hours as needed for mild pain or headache.     Marland Kitchen aspirin 81 MG tablet Take 81 mg by mouth daily.    Marland Kitchen atorvastatin (LIPITOR) 20 MG tablet TAKE 1 TABLET BY MOUTH ONCE DAILY AT 6 PM 90 tablet 2  . cetirizine (ZYRTEC) 10 MG tablet Take 10 mg by mouth daily as needed for allergies.    .  cholecalciferol (VITAMIN D3) 25 MCG (1000 UNIT) tablet     . lisinopril (ZESTRIL) 20 MG tablet Take 1 tablet (20 mg total) by mouth daily. 90 tablet 3  . metoprolol tartrate (LOPRESSOR) 25 MG tablet TAKE 1/2 TABLETS BY MOUTH TWICE DAILY 90 tablet 3  . nitroGLYCERIN (NITROSTAT) 0.4 MG SL tablet DISSOLVE ONE TABLET UNDER THE TONGUE EVERY 5 MINUTES AS NEEDED FOR CHEST PAIN.  DO NOT EXCEED A TOTAL OF 3 DOSES IN 15 MINUTES 75 tablet 1  . Zinc 50 MG TABS      No current facility-administered medications for this visit.    Past Medical History:  Diagnosis Date  . Adenomatous polyps   . Hypertension   . Impaired fasting glucose   . Seasonal allergies     Past Surgical History:  Procedure Laterality Date  . COLONOSCOPY  10/03/05   inflamed hyperplastic polyp/adenomatous poylp, left sided diverticula  . COLONOSCOPY  03/03/2012   Procedure: COLONOSCOPY;  Surgeon: Daneil Dolin, MD;  [facility];  Service: Endoscopy;  Laterality: N/A;  10:15  . COLONOSCOPY N/A 04/11/2017   Procedure: COLONOSCOPY;  Surgeon: Daneil Dolin, MD;  [facility];  Service: Endoscopy;  Laterality: N/A;  8:30 AM - moved to 5/24 @ 1:15  . CORONARY ANGIOPLASTY WITH STENT PLACEMENT  07/16/2014   "1"  . KNEE ARTHROSCOPY Right ~ 1994  . KNEE ARTHROSCOPY Left ~ 2001  . LEFT AND RIGHT HEART CATHETERIZATION WITH CORONARY ANGIOGRAM N/A 07/16/2014   Procedure: LEFT AND RIGHT  HEART CATHETERIZATION WITH CORONARY ANGIOGRAM;  Surgeon: [name], MD;  Location: [facility] CATH LAB;  Service: Cardiovascular;  Laterality: N/A;  . POLYPECTOMY  04/11/2017   Procedure: POLYPECTOMY;  Surgeon: Daneil Dolin, MD;  [facility];  Service: Endoscopy;;  colon    Social History   Socioeconomic History  . Marital status: Married    Spouse name: Not on file  . Number of children: Not on file  . Years of education: Not on file  . Highest education level: Not on file  Occupational History  . Not on file   Tobacco Use  . Smoking status: Never Smoker  . Smokeless tobacco: Never Used  Substance and Sexual Activity  . Alcohol use: Yes    Alcohol/week: 2.0 standard drinks    Types: 2 Glasses of wine per week  . Drug use: No  . Sexual activity: Yes  Other Topics Concern  . Not on file  Social History Narrative  . Not on file   Social Determinants of Health   Financial Resource Strain:   . Difficulty of Paying Living Expenses: Not on file  Food Insecurity:   . Worried About Charity fundraiser in the Last Year: Not on file  . Ran Out of Food in the Last Year: Not on file  Transportation Needs:   . Lack of Transportation (Medical): Not on file  . Lack of Transportation (Non-Medical): Not on file  Physical Activity:   . Days of Exercise per Week: Not on file  . Minutes of Exercise per Session: Not on file  Stress:   . Feeling of Stress : Not on file  Social Connections:   . Frequency of Communication with Friends and Family: Not on file  . Frequency of Social Gatherings with Friends and Family: Not on file  . Attends Religious Services: Not on file  . Active Member of Clubs or Organizations: Not on file  . Attends Archivist Meetings: Not on file  . Marital Status: Not on file  Intimate Partner Violence:   . Fear of Current or Ex-Partner: Not on file  . Emotionally Abused: Not on file  . Physically Abused: Not on file  . Sexually Abused: Not on file      Vitals:   01/05/20 1417  BP: (!) 141/77  Pulse: 61  Temp: 98.9 F (37.2 C)  SpO2: 99%  Weight: 206 lb (93.4 kg)  Height: 6' (1.829 m)    Wt Readings from Last 3 Encounters:  01/05/20 206 lb (93.4 kg)  01/12/19 209 lb (94.8 kg)  12/29/18 206 lb 12.8 oz (93.8 kg)     PHYSICAL EXAM General: NAD HEENT: Normal. Neck: No JVD, no thyromegaly. Lungs: Clear to auscultation bilaterally with normal respiratory effort. CV: Regular rate and rhythm, normal S1/S2, no S3/S4, no murmur. No pretibial or periankle  edema.  No carotid bruit.   Abdomen: Soft, nontender, no distention.  Neurologic: Alert and oriented.  Psych: Normal affect. Skin: Normal. Musculoskeletal: No gross deformities.      Labs: Lab Results  Component Value Date/Time   K 4.4 12/28/2019 08:20 AM   BUN 21 12/28/2019 08:20 AM   CREATININE 1.68 (H) 12/28/2019 08:20 AM   CREATININE 1.30 10/19/2014 07:43 AM   ALT 27 12/28/2019 08:20 AM   HGB 14.8 07/17/2014 04:27 AM     Lipids: Lab Results  Component Value Date/Time   LDLCALC 69 12/28/2019 08:20 AM   CHOL 143 12/28/2019 08:20 AM  TRIG 105 12/28/2019 08:20 AM   HDL 55 12/28/2019 08:20 AM       ASSESSMENT AND PLAN:  1. CAD s/p LAD DES: Stable ischemic heart disease. Continue aspirin, metoprolol, and statin therapy.   2.Chronic hypertension:Mildly elevated today. No changes.  3. Hyperlipidemia: Lipid panel from February 2021 reviewed above.  LDL at goal, 69.  Continue statin therapy.   Disposition: Follow up 1 year   Kate Sable, M.D., F.A.C.C.

## 2020-01-06 ENCOUNTER — Encounter: Payer: Self-pay | Admitting: Family Medicine

## 2020-01-06 ENCOUNTER — Other Ambulatory Visit: Payer: Self-pay

## 2020-01-06 ENCOUNTER — Ambulatory Visit (INDEPENDENT_AMBULATORY_CARE_PROVIDER_SITE_OTHER): Payer: Medicare Other | Admitting: Family Medicine

## 2020-01-06 ENCOUNTER — Ambulatory Visit: Payer: Medicare Other

## 2020-01-06 VITALS — BP 134/78 | Temp 96.6°F | Ht 72.0 in | Wt 205.8 lb

## 2020-01-06 DIAGNOSIS — N183 Chronic kidney disease, stage 3 unspecified: Secondary | ICD-10-CM | POA: Diagnosis not present

## 2020-01-06 DIAGNOSIS — H6122 Impacted cerumen, left ear: Secondary | ICD-10-CM | POA: Diagnosis not present

## 2020-01-06 DIAGNOSIS — Z0000 Encounter for general adult medical examination without abnormal findings: Secondary | ICD-10-CM | POA: Diagnosis not present

## 2020-01-06 NOTE — Progress Notes (Signed)
Subjective:    Patient ID: SIYON BAGGOTT, male    DOB: 1946-05-04, 74 y.o.   MRN: [id number]  HPI AWV- Annual Wellness Visit  The patient was seen for their annual wellness visit. The patient's past medical history, surgical history, and family history were reviewed. Pertinent vaccines were reviewed ( tetanus, pneumonia, shingles, flu) The patient's medication list was reviewed and updated.  The height and weight were entered.  BMI recorded in electronic record elsewhere  Cognitive screening was completed. Outcome of Mini - [id number]   Falls /depression screening electronically recorded within record elsewhere  Current tobacco usage: none (All patients who use tobacco were given written and verbal information on quitting)  Recent listing of emergency department/hospitalizations over the past year were reviewed.  current specialist the patient sees on a regular basis: Dr.Koneswaran; Dr.Hall   Medicare annual wellness visit patient questionnaire was reviewed.  A written screening schedule for the patient for the next 5-10 years was given. Appropriate discussion of followup regarding next visit was discussed.  Left ear is stopped up due to patient trying to clean it out.   Has not cked the b p recently, got off the habit   Blood pressure medicine and blood pressure levels reviewed today with patient. Compliant with blood pressure medicine. States does not miss a dose. No obvious side effects. Blood pressure generally good when checked elsewhere. Watching salt intake.  Patient continues to take lipid medication regularly. No obvious side effects from it. Generally does not miss a dose. Prior blood work results are reviewed with patient. Patient continues to work on fat intake in diet   Results for orders placed or performed in visit on 11/12/19  Novel Coronavirus, NAA ([facility])   Specimen: Nasopharyngeal(NP) swabs in vial transport medium   NASOPHARYNGE  TESTING  Result  Value Ref Range   SARS-CoV-2, NAA Not Detected Not Detected     Review of Systems  Constitutional: Negative for activity change, appetite change and fever.  HENT: Negative for congestion and rhinorrhea.   Eyes: Negative for discharge.  Respiratory: Negative for cough and wheezing.   Cardiovascular: Negative for chest pain.  Gastrointestinal: Negative for abdominal pain, blood in stool and vomiting.  Genitourinary: Negative for difficulty urinating and frequency.  Musculoskeletal: Negative for neck pain.  Skin: Negative for rash.  Allergic/Immunologic: Negative for environmental allergies and food allergies.  Neurological: Negative for weakness and headaches.  Psychiatric/Behavioral: Negative for agitation.  All other systems reviewed and are negative.      Objective:   Physical Exam Vitals reviewed.  Constitutional:      Appearance: He is well-developed.  HENT:     Head: Normocephalic and atraumatic.     Right Ear: External ear normal.     Left Ear: External ear normal.     Nose: Nose normal.  Eyes:     Pupils: Pupils are equal, round, and reactive to light.  Neck:     Thyroid: No thyromegaly.  Cardiovascular:     Rate and Rhythm: Normal rate and regular rhythm.     Heart sounds: Normal heart sounds. No murmur.  Pulmonary:     Effort: Pulmonary effort is normal. No respiratory distress.     Breath sounds: Normal breath sounds. No wheezing.  Abdominal:     General: Bowel sounds are normal. There is no distension.     Palpations: Abdomen is soft. There is no mass.     Tenderness: There is no abdominal tenderness.  Genitourinary:  Penis: Normal.   Musculoskeletal:        General: Normal range of motion.     Cervical back: Normal range of motion and neck supple.  Lymphadenopathy:     Cervical: No cervical adenopathy.  Skin:    General: Skin is warm and dry.     Findings: No erythema.  Neurological:     Mental Status: He is alert.     Motor: No abnormal muscle  tone.  Psychiatric:        Behavior: Behavior normal.        Judgment: Judgment normal.           Assessment & Plan:  Impression wellness exam.  Diet discussed.  Exercise discussed.  Colonoscopy, 3 years ago Yr hx of  Needing every five yrs.  2.  Coronary artery disease with patient's hypertension and hyperlipidemia followed by his cardiologist.  3.  Chronic kidney disease stage III discussed.  Options discussed.  Petra Kuba of disease discussed will refer to nephrologist for evaluation rationale discussed.  4.  Left ear cerumen impaction refer to ENT

## 2020-01-11 DIAGNOSIS — Z23 Encounter for immunization: Secondary | ICD-10-CM | POA: Diagnosis not present

## 2020-01-16 ENCOUNTER — Other Ambulatory Visit: Payer: Self-pay | Admitting: Family Medicine

## 2020-01-19 ENCOUNTER — Encounter: Payer: Self-pay | Admitting: Family Medicine

## 2020-02-22 DIAGNOSIS — N1832 Chronic kidney disease, stage 3b: Secondary | ICD-10-CM | POA: Diagnosis not present

## 2020-02-22 DIAGNOSIS — E785 Hyperlipidemia, unspecified: Secondary | ICD-10-CM | POA: Diagnosis not present

## 2020-02-22 DIAGNOSIS — I129 Hypertensive chronic kidney disease with stage 1 through stage 4 chronic kidney disease, or unspecified chronic kidney disease: Secondary | ICD-10-CM | POA: Diagnosis not present

## 2020-03-03 DIAGNOSIS — N183 Chronic kidney disease, stage 3 unspecified: Secondary | ICD-10-CM | POA: Insufficient documentation

## 2020-03-03 DIAGNOSIS — N1831 Chronic kidney disease, stage 3a: Secondary | ICD-10-CM | POA: Insufficient documentation

## 2020-03-17 DIAGNOSIS — L570 Actinic keratosis: Secondary | ICD-10-CM | POA: Diagnosis not present

## 2020-03-17 DIAGNOSIS — L821 Other seborrheic keratosis: Secondary | ICD-10-CM | POA: Diagnosis not present

## 2020-03-17 DIAGNOSIS — Z1283 Encounter for screening for malignant neoplasm of skin: Secondary | ICD-10-CM | POA: Diagnosis not present

## 2020-03-17 DIAGNOSIS — C4441 Basal cell carcinoma of skin of scalp and neck: Secondary | ICD-10-CM | POA: Diagnosis not present

## 2020-03-17 DIAGNOSIS — X32XXXD Exposure to sunlight, subsequent encounter: Secondary | ICD-10-CM | POA: Diagnosis not present

## 2020-03-17 DIAGNOSIS — B029 Zoster without complications: Secondary | ICD-10-CM | POA: Diagnosis not present

## 2020-04-11 ENCOUNTER — Other Ambulatory Visit: Payer: Self-pay | Admitting: Cardiovascular Disease

## 2020-04-11 ENCOUNTER — Encounter: Payer: Self-pay | Admitting: Family Medicine

## 2020-04-11 NOTE — Telephone Encounter (Signed)
Mikey Kirschner, MD  You 1 hour ago (11:52 AM)   Tell Amier thanks and now I may start taking some cool trips like he does regularly!

## 2020-05-13 DIAGNOSIS — M722 Plantar fascial fibromatosis: Secondary | ICD-10-CM | POA: Diagnosis not present

## 2020-05-13 DIAGNOSIS — M79671 Pain in right foot: Secondary | ICD-10-CM | POA: Diagnosis not present

## 2020-05-24 ENCOUNTER — Other Ambulatory Visit: Payer: Self-pay

## 2020-05-24 ENCOUNTER — Telehealth (INDEPENDENT_AMBULATORY_CARE_PROVIDER_SITE_OTHER): Payer: Medicare Other | Admitting: Family Medicine

## 2020-05-24 ENCOUNTER — Telehealth: Payer: Self-pay | Admitting: Family Medicine

## 2020-05-24 DIAGNOSIS — J019 Acute sinusitis, unspecified: Secondary | ICD-10-CM | POA: Diagnosis not present

## 2020-05-24 MED ORDER — AMOXICILLIN 500 MG PO TABS: 500.0000 mg | ORAL_TABLET | Freq: Three times a day (TID) | ORAL | 0 refills | Status: DC

## 2020-05-24 NOTE — Progress Notes (Signed)
   Subjective:    Patient ID: Roger [name], male    DOB: 01/10/1946, 74 y.o.   MRN: [id number]  Sinusitis This is a new problem. The current episode started yesterday. (Pt state he is having some aches below right eye. Bending over makes the ache worse. Right nostril wants to drip some. ) Past treatments include acetaminophen. The treatment provided mild relief.  Patient head congestion drainage also relates sinus pressure on the left side denies wheezing difficulty breathing patient did have Covid vaccine previously denies any high fever chills or sweats Virtual Visit via Telephone Note  I connected with Roger [name] on 05/24/20 at  4:10 PM EDT by telephone and verified that I am speaking with the correct person using two identifiers.  Location: Patient: home Provider: office   I discussed the limitations, risks, security and privacy concerns of performing an evaluation and management service by telephone and the availability of in person appointments. I also discussed with the patient that there may be a patient responsible charge related to this service. The patient expressed understanding and agreed to proceed.   History of Present Illness:    Observations/Objective:   Assessment and Plan:   Follow Up Instructions:    I discussed the assessment and treatment plan with the patient. The patient was provided an opportunity to ask questions and all were answered. The patient agreed with the plan and demonstrated an understanding of the instructions.   The patient was advised to call back or seek an in-person evaluation if the symptoms worsen or if the condition fails to improve as anticipated.  I provided 20 minutes of non-face-to-face time during this encounter.       Review of Systems     Objective:   Physical Exam   Today's visit was via telephone Physical exam was not possible for this visit      Assessment & Plan:  Acute rhinosinusitis Antibiotic  prescribed warning signs discussed If patient has progressive symptoms this week goes on he is to notify us No need to do Covid testing currently unlikely to be Covid

## 2020-05-24 NOTE — Telephone Encounter (Signed)
Mr. tosh, glaze are scheduled for a virtual visit with your provider today.    Just as we do with appointments in the office, we must obtain your consent to participate.  Your consent will be active for this visit and any virtual visit you may have with one of our providers in the next 365 days.    If you have a MyChart account, I can also send a copy of this consent to you electronically.  All virtual visits are billed to your insurance company just like a traditional visit in the office.  As this is a virtual visit, video technology does not allow for your provider to perform a traditional examination.  This may limit your provider's ability to fully assess your condition.  If your provider identifies any concerns that need to be evaluated in person or the need to arrange testing such as labs, EKG, etc, we will make arrangements to do so.    Although advances in technology are sophisticated, we cannot ensure that it will always work on either your end or our end.  If the connection with a video visit is poor, we may have to switch to a telephone visit.  With either a video or telephone visit, we are not always able to ensure that we have a secure connection.   I need to obtain your verbal consent now.   Are you willing to proceed with your visit today?   BRELAN HANNEN has provided verbal consent on 05/24/2020 for a virtual visit (video or telephone).   Vicente Males, LPN 02/20/1539  0:86 AM

## 2020-06-24 DIAGNOSIS — M722 Plantar fascial fibromatosis: Secondary | ICD-10-CM | POA: Diagnosis not present

## 2020-06-24 DIAGNOSIS — M7660 Achilles tendinitis, unspecified leg: Secondary | ICD-10-CM | POA: Diagnosis not present

## 2020-06-27 DIAGNOSIS — E785 Hyperlipidemia, unspecified: Secondary | ICD-10-CM | POA: Diagnosis not present

## 2020-06-27 DIAGNOSIS — N1832 Chronic kidney disease, stage 3b: Secondary | ICD-10-CM | POA: Diagnosis not present

## 2020-06-27 DIAGNOSIS — I129 Hypertensive chronic kidney disease with stage 1 through stage 4 chronic kidney disease, or unspecified chronic kidney disease: Secondary | ICD-10-CM | POA: Diagnosis not present

## 2020-07-12 ENCOUNTER — Other Ambulatory Visit: Payer: Self-pay | Admitting: Family Medicine

## 2020-07-13 ENCOUNTER — Other Ambulatory Visit: Payer: Self-pay | Admitting: *Deleted

## 2020-07-13 ENCOUNTER — Telehealth: Payer: Self-pay | Admitting: Family Medicine

## 2020-07-13 ENCOUNTER — Other Ambulatory Visit: Payer: Self-pay | Admitting: Cardiovascular Disease

## 2020-07-13 MED ORDER — LISINOPRIL 20 MG PO TABS: 20.0000 mg | ORAL_TABLET | Freq: Every day | ORAL | 0 refills | Status: DC

## 2020-07-13 MED ORDER — LISINOPRIL 20 MG PO TABS: 20.0000 mg | ORAL_TABLET | Freq: Every day | ORAL | 1 refills | Status: DC

## 2020-07-13 NOTE — Telephone Encounter (Signed)
Refill sent and pt was notified and made a follow up

## 2020-07-13 NOTE — Telephone Encounter (Signed)
May have 90 day Follow up with [place] this fall

## 2020-07-13 NOTE — Telephone Encounter (Signed)
Pt's wife called to check on refill of lisinopril (ZESTRIL) 20 MG tablet   States they are leaving for a week tomorrow & would needs to get this refilled    [facility]

## 2020-07-13 NOTE — Telephone Encounter (Signed)
New message      *STAT* If patient is at the pharmacy, call can be transferred to refill team.   1. Which medications need to be refilled? (please list name of each medication and dose if known) lisinopril (ZESTRIL) 20 MG tablet 2. Which pharmacy/location (including street and city if local pharmacy) is medication to be sent to? [facility]   3. Do they need a 30 day or 90 day supply? [facility]

## 2020-08-30 ENCOUNTER — Encounter: Payer: Self-pay | Admitting: Family Medicine

## 2020-08-30 ENCOUNTER — Ambulatory Visit (INDEPENDENT_AMBULATORY_CARE_PROVIDER_SITE_OTHER): Payer: Medicare Other | Admitting: Family Medicine

## 2020-08-30 VITALS — BP 138/76 | HR 64 | Temp 96.4°F | Wt 203.4 lb

## 2020-08-30 DIAGNOSIS — Z23 Encounter for immunization: Secondary | ICD-10-CM | POA: Diagnosis not present

## 2020-08-30 DIAGNOSIS — I10 Essential (primary) hypertension: Secondary | ICD-10-CM

## 2020-08-30 DIAGNOSIS — N183 Chronic kidney disease, stage 3 unspecified: Secondary | ICD-10-CM | POA: Diagnosis not present

## 2020-08-30 DIAGNOSIS — I25118 Atherosclerotic heart disease of native coronary artery with other forms of angina pectoris: Secondary | ICD-10-CM | POA: Diagnosis not present

## 2020-08-30 MED ORDER — LISINOPRIL 20 MG PO TABS: 20.0000 mg | ORAL_TABLET | Freq: Every day | ORAL | 1 refills | Status: DC

## 2020-08-30 NOTE — Progress Notes (Signed)
   Subjective:    Patient ID: Roger [name], male    DOB: 02/03/1946, 74 y.o.   MRN: [id number]  Hypertension This is a chronic problem. Pertinent negatives include no chest pain, headaches or shortness of breath. There are no compliance problems.   Pt states kidney doctor would like him to have lab work.  Patient is somewhat concerned about his kidney functions tries to eat healthy   Review of Systems  Constitutional: Negative for diaphoresis and fatigue.  HENT: Negative for congestion and rhinorrhea.   Respiratory: Negative for cough and shortness of breath.   Cardiovascular: Negative for chest pain and leg swelling.  Gastrointestinal: Negative for abdominal pain and diarrhea.  Skin: Negative for color change and rash.  Neurological: Negative for dizziness and headaches.  Psychiatric/Behavioral: Negative for behavioral problems and confusion.       Objective:   Physical Exam Vitals reviewed.  Constitutional:      General: He is not in acute distress. HENT:     Head: Normocephalic and atraumatic.  Eyes:     General:        Right eye: No discharge.        Left eye: No discharge.  Neck:     Trachea: No tracheal deviation.  Cardiovascular:     Rate and Rhythm: Normal rate and regular rhythm.     Heart sounds: Normal heart sounds. No murmur heard.   Pulmonary:     Effort: Pulmonary effort is normal. No respiratory distress.     Breath sounds: Normal breath sounds.  Lymphadenopathy:     Cervical: No cervical adenopathy.  Skin:    General: Skin is warm and dry.  Neurological:     Mental Status: He is alert.     Coordination: Coordination normal.  Psychiatric:        Behavior: Behavior normal.           Assessment & Plan:  1. Need for vaccination Flu shot today - Flu Vaccine QUAD High Dose(Fluad) Blood pressure good control continue current medicines Patient will do his lab work with his kidney doctor and follow-up with them Wellness exam in February with  lab work

## 2020-09-15 ENCOUNTER — Ambulatory Visit: Payer: Medicare Other | Attending: Internal Medicine

## 2020-09-15 DIAGNOSIS — Z23 Encounter for immunization: Secondary | ICD-10-CM

## 2020-09-15 NOTE — Progress Notes (Signed)
   Covid-19 Vaccination Clinic  Name:  VADHIR MCNAY    MRN: [id number] DOB: [birth year]  09/15/2020  Mr. Ybarbo was observed post Covid-19 immunization for 15 minutes without incident. He was provided with Vaccine Information Sheet and instruction to access the V-Safe system.   Mr. Splawn was instructed to call 911 with any severe reactions post vaccine: Marland Kitchen Difficulty breathing  . Swelling of face and throat  . A fast heartbeat  . A bad rash all over body  . Dizziness and weakness

## 2020-09-26 DIAGNOSIS — N1832 Chronic kidney disease, stage 3b: Secondary | ICD-10-CM | POA: Diagnosis not present

## 2020-09-30 ENCOUNTER — Telehealth: Payer: Self-pay

## 2020-09-30 DIAGNOSIS — H6120 Impacted cerumen, unspecified ear: Secondary | ICD-10-CM

## 2020-09-30 NOTE — Telephone Encounter (Signed)
Wants referral to ENT for ear wax removal, ear clogged with wax.

## 2020-09-30 NOTE — Telephone Encounter (Signed)
Referral placed. Left message to return call 

## 2020-09-30 NOTE — Telephone Encounter (Signed)
May go ahead with this

## 2020-10-03 NOTE — Telephone Encounter (Signed)
Pt.notified

## 2020-10-04 ENCOUNTER — Encounter: Payer: Self-pay | Admitting: Family Medicine

## 2020-10-05 MED ORDER — LISINOPRIL 10 MG PO TABS: ORAL_TABLET | ORAL | 0 refills | Status: DC

## 2020-10-06 DIAGNOSIS — H6981 Other specified disorders of Eustachian tube, right ear: Secondary | ICD-10-CM | POA: Insufficient documentation

## 2020-10-06 DIAGNOSIS — H9313 Tinnitus, bilateral: Secondary | ICD-10-CM | POA: Insufficient documentation

## 2020-10-06 DIAGNOSIS — H90A22 Sensorineural hearing loss, unilateral, left ear, with restricted hearing on the contralateral side: Secondary | ICD-10-CM | POA: Diagnosis not present

## 2020-10-06 DIAGNOSIS — H90A31 Mixed conductive and sensorineural hearing loss, unilateral, right ear with restricted hearing on the contralateral side: Secondary | ICD-10-CM | POA: Diagnosis not present

## 2020-10-06 DIAGNOSIS — H9113 Presbycusis, bilateral: Secondary | ICD-10-CM | POA: Diagnosis not present

## 2020-10-11 DIAGNOSIS — H9113 Presbycusis, bilateral: Secondary | ICD-10-CM | POA: Insufficient documentation

## 2020-10-27 DIAGNOSIS — N1832 Chronic kidney disease, stage 3b: Secondary | ICD-10-CM | POA: Diagnosis not present

## 2020-12-01 ENCOUNTER — Other Ambulatory Visit: Payer: Medicare Other

## 2020-12-01 DIAGNOSIS — Z20822 Contact with and (suspected) exposure to covid-19: Secondary | ICD-10-CM

## 2020-12-04 LAB — NOVEL CORONAVIRUS, NAA: SARS-CoV-2, NAA: NOT DETECTED

## 2020-12-27 ENCOUNTER — Encounter: Payer: Self-pay | Admitting: Family Medicine

## 2020-12-27 ENCOUNTER — Encounter: Payer: Medicare Other | Admitting: Family Medicine

## 2020-12-27 ENCOUNTER — Other Ambulatory Visit: Payer: Self-pay | Admitting: Family Medicine

## 2020-12-27 DIAGNOSIS — E785 Hyperlipidemia, unspecified: Secondary | ICD-10-CM

## 2020-12-27 DIAGNOSIS — I10 Essential (primary) hypertension: Secondary | ICD-10-CM

## 2020-12-27 DIAGNOSIS — N183 Chronic kidney disease, stage 3 unspecified: Secondary | ICD-10-CM

## 2020-12-27 DIAGNOSIS — Z125 Encounter for screening for malignant neoplasm of prostate: Secondary | ICD-10-CM

## 2020-12-27 NOTE — Progress Notes (Signed)
Primary Cardiologist:  Bronson Ing New to me   SUBJECTIVE:  75 y.o. f/u CAD. DES to [place] in 2015. D1 jailed at time His EF was normal by  TTE 2015 with trivial AR/MR He is on beta blocker, ACE and statin along with Baby aspirin He has had flu shot and moderna vaccine He has CRF with baseline Cr of 1.68 His LDL is 69   He is very active with no angina Had classic angina prior to his stent Has daughter with 3 kids local and son with a child He is retired from TEFL teacher to travel   Primary [place] graduated from [employer] with a degree in Charity fundraiser. He worked in the [employer] for 38 years. His sister-in-law, Manus Gunning, is a Cabin crew in [place].  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  Review of Systems: As per "subjective", otherwise negative.  No Known Allergies  Current Outpatient Medications  Medication Sig Dispense Refill  . acetaminophen (TYLENOL) 500 MG tablet Take 500-1,000 mg by mouth every 6 (six) hours as needed for mild pain or headache.     Marland Kitchen aspirin 81 MG tablet Take 81 mg by mouth daily.    Marland Kitchen atorvastatin (LIPITOR) 20 MG tablet TAKE 1 TABLET BY MOUTH ONCE DAILY AT 6 PM 90 tablet 3  . cetirizine (ZYRTEC) 10 MG tablet Take 10 mg by mouth daily as needed for allergies.    . cholecalciferol (VITAMIN D3) 25 MCG (1000 UNIT) tablet     . lisinopril (ZESTRIL) 10 MG tablet Take one tablet po daily 90 tablet 0  . metoprolol tartrate (LOPRESSOR) 25 MG tablet TAKE 1/2 TABLETS BY MOUTH TWICE DAILY 90 tablet 3  . nitroGLYCERIN (NITROSTAT) 0.4 MG SL tablet ONE TABLET UNDER THE TONGUE EVERY 5 MINUTES AS NEEDED FOR CHEST PAIN. MAX 3 DOSES IN 15 MIN 75 tablet 1  . Zinc 50 MG TABS      No current facility-administered medications for this visit.    Past Medical History:  Diagnosis Date  . Adenomatous polyps   . Hypertension   . Impaired  fasting glucose   . Seasonal allergies     Past Surgical History:  Procedure Laterality Date  . COLONOSCOPY  10/03/05   inflamed hyperplastic polyp/adenomatous poylp, left sided diverticula  . COLONOSCOPY  03/03/2012   Procedure: COLONOSCOPY;  Surgeon: Daneil Dolin, MD;  [facility]: [facility];  Service: Endoscopy;  Laterality: N/A;  10:15  . COLONOSCOPY N/A 04/11/2017   Procedure: COLONOSCOPY;  Surgeon: Daneil Dolin, MD;  Location: [facility];  Service: Endoscopy;  Laterality: N/A;  8:30 AM - moved to 5/24 @ 1:15  . CORONARY ANGIOPLASTY WITH STENT PLACEMENT  07/16/2014   "1"  . KNEE ARTHROSCOPY Right ~ 1994  . KNEE ARTHROSCOPY Left ~ 2001  . LEFT AND RIGHT HEART CATHETERIZATION WITH CORONARY ANGIOGRAM N/A 07/16/2014   Procedure: LEFT AND RIGHT HEART CATHETERIZATION WITH CORONARY ANGIOGRAM;  Surgeon: [name], MD;  Location: [facility] CATH LAB;  Service: Cardiovascular;  Laterality: N/A;  . POLYPECTOMY  04/11/2017   Procedure: POLYPECTOMY;  Surgeon: Daneil Dolin, MD;  Location: [facility];  Service: Endoscopy;;  colon    Social History   Socioeconomic History  . Marital status: Married    Spouse name: Not on file  . Number of children: Not on file  .  Years of education: Not on file  . Highest education level: Not on file  Occupational History  . Not on file  Tobacco Use  . Smoking status: Never Smoker  . Smokeless tobacco: Never Used  Vaping Use  . Vaping Use: Never used  Substance and Sexual Activity  . Alcohol use: Yes    Alcohol/week: 2.0 standard drinks    Types: 2 Glasses of wine per week  . Drug use: No  . Sexual activity: Yes  Other Topics Concern  . Not on file  Social History Narrative  . Not on file   Social Determinants of Health   Financial Resource Strain: Not on file  Food Insecurity: Not on file  Transportation Needs: Not on file  Physical Activity: Not on file  Stress: Not on file  Social Connections: Not on file  Intimate Partner  Violence: Not on file      There were no vitals filed for this visit.  Wt Readings from Last 3 Encounters:  08/30/20 92.3 kg  01/06/20 93.4 kg  01/05/20 93.4 kg     PHYSICAL EXAM  Affect appropriate Healthy:  appears stated age 84: normal Neck supple with no adenopathy JVP normal no bruits no thyromegaly Lungs clear with no wheezing and good diaphragmatic motion Heart:  S1/S2 no murmur, no rub, gallop or click PMI normal Abdomen: benighn, BS positve, no tenderness, no AAA no bruit.  No HSM or HJR Distal pulses intact with no bruits No edema Neuro non-focal Skin warm and dry No muscular weakness    Labs: Lab Results  Component Value Date/Time   K 4.4 12/28/2019 08:20 AM   BUN 21 12/28/2019 08:20 AM   CREATININE 1.68 (H) 12/28/2019 08:20 AM   CREATININE 1.30 10/19/2014 07:43 AM   ALT 27 12/28/2019 08:20 AM   HGB 14.8 07/17/2014 04:27 AM     Lipids: Lab Results  Component Value Date/Time   LDLCALC 69 12/28/2019 08:20 AM   CHOL 143 12/28/2019 08:20 AM   TRIG 105 12/28/2019 08:20 AM   HDL 55 12/28/2019 08:20 AM       ASSESSMENT AND PLAN:  1. CAD s/p LAD DES: 2015 Stable ischemic heart disease. Continue aspirin, metoprolol, and statin therapy.    2.Chronic hypertension:continue current meds   3. Hyperlipidemia: Lipid panel from February 2021 reviewed above.  LDL at goal, 69.  Continue statin therapy.  4. CRF:  Bi yearly Cr with Dr Wolfgang Phoenix avoid diuretics ACE ok for now    Disposition: Follow up 1 year      Jenkins Rouge MD [facility]

## 2020-12-27 NOTE — Telephone Encounter (Signed)
Since Please order lipid, CMP, PSA and urine ACR, and urinalysis Please send Lemuel notification  Hi Claudie-please do your lab work. With this lab work we are also doing a urinalysis as well. We will see you at your physical. TakeCare-Dr. Nicki Reaper

## 2020-12-28 NOTE — Addendum Note (Signed)
Addended by: Vicente Males on: 12/28/2020 08:25 AM   Modules accepted: Orders

## 2020-12-30 LAB — COMPREHENSIVE METABOLIC PANEL
ALT: 23 IU/L (ref 0–44)
AST: 25 IU/L (ref 0–40)
Albumin/Globulin Ratio: 2 (ref 1.2–2.2)
Albumin: 4.4 g/dL (ref 3.7–4.7)
Alkaline Phosphatase: 97 IU/L (ref 44–121)
BUN/Creatinine Ratio: 15 (ref 10–24)
BUN: 21 mg/dL (ref 8–27)
Bilirubin Total: 0.4 mg/dL (ref 0.0–1.2)
CO2: 22 mmol/L (ref 20–29)
Calcium: 9.2 mg/dL (ref 8.6–10.2)
Chloride: 104 mmol/L (ref 96–106)
Creatinine, Ser: 1.42 mg/dL — ABNORMAL HIGH (ref 0.76–1.27)
GFR calc Af Amer: 56 mL/min/{1.73_m2} — ABNORMAL LOW (ref >59)
GFR calc non Af Amer: 48 mL/min/{1.73_m2} — ABNORMAL LOW (ref >59)
Globulin, Total: 2.2 g/dL (ref 1.5–4.5)
Glucose: 97 mg/dL (ref 65–99)
Potassium: 4.7 mmol/L (ref 3.5–5.2)
Sodium: 145 mmol/L — ABNORMAL HIGH (ref 134–144)
Total Protein: 6.6 g/dL (ref 6.0–8.5)

## 2020-12-30 LAB — LIPID PANEL
Chol/HDL Ratio: 2.6 ratio (ref 0.0–5.0)
Cholesterol, Total: 146 mg/dL (ref 100–199)
HDL: 56 mg/dL (ref >39)
LDL Chol Calc (NIH): 74 mg/dL (ref 0–99)
Triglycerides: 86 mg/dL (ref 0–149)
VLDL Cholesterol Cal: 16 mg/dL (ref 5–40)

## 2020-12-30 LAB — URINALYSIS
Bilirubin, UA: NEGATIVE
Glucose, UA: NEGATIVE
Ketones, UA: NEGATIVE
Leukocytes,UA: NEGATIVE
Nitrite, UA: NEGATIVE
Protein,UA: NEGATIVE
RBC, UA: NEGATIVE
Specific Gravity, UA: 1.024 (ref 1.005–1.030)
Urobilinogen, Ur: 0.2 mg/dL (ref 0.2–1.0)
pH, UA: 5.5 (ref 5.0–7.5)

## 2020-12-30 LAB — PSA: Prostate Specific Ag, Serum: 0.4 ng/mL (ref 0.0–4.0)

## 2020-12-30 LAB — MICROALBUMIN / CREATININE URINE RATIO
Creatinine, Urine: 156.8 mg/dL
Microalb/Creat Ratio: 8 mg/g creat (ref 0–29)
Microalbumin, Urine: 12.2 ug/mL

## 2021-01-03 ENCOUNTER — Ambulatory Visit (INDEPENDENT_AMBULATORY_CARE_PROVIDER_SITE_OTHER): Payer: Medicare Other | Admitting: Family Medicine

## 2021-01-03 ENCOUNTER — Other Ambulatory Visit: Payer: Self-pay

## 2021-01-03 ENCOUNTER — Encounter: Payer: Self-pay | Admitting: Family Medicine

## 2021-01-03 VITALS — BP 126/68 | Temp 97.9°F | Ht 72.0 in | Wt 197.8 lb

## 2021-01-03 DIAGNOSIS — Z0000 Encounter for general adult medical examination without abnormal findings: Secondary | ICD-10-CM | POA: Diagnosis not present

## 2021-01-03 DIAGNOSIS — E785 Hyperlipidemia, unspecified: Secondary | ICD-10-CM | POA: Diagnosis not present

## 2021-01-03 DIAGNOSIS — I2510 Atherosclerotic heart disease of native coronary artery without angina pectoris: Secondary | ICD-10-CM | POA: Diagnosis not present

## 2021-01-03 MED ORDER — FAMOTIDINE 40 MG PO TABS: 40.0000 mg | ORAL_TABLET | Freq: Every day | ORAL | 1 refills | Status: DC

## 2021-01-03 MED ORDER — LISINOPRIL 10 MG PO TABS: ORAL_TABLET | ORAL | 1 refills | Status: DC

## 2021-01-03 MED ORDER — ATORVASTATIN CALCIUM 40 MG PO TABS: ORAL_TABLET | ORAL | 1 refills | Status: DC

## 2021-01-03 NOTE — Progress Notes (Signed)
Subjective:    Patient ID: Roger [name], male    DOB: August 21, 1946, 75 y.o.   MRN: [id number]  HPI The patient comes in today for a wellness visit. Here today for annual wellness Mini cog-passes No falls Denies depression Drives well Up-to-date on wellness preventative such as colonoscopy   A review of their health history was completed.  A review of medications was also completed.  Any needed refills; metoprolol  Eating habits: trying to eat good  Falls/  MVA accidents in past few months: none  Regular exercise: yes -4-5 days a week  Specialist pt sees on regular basis: kidney doctor and dermatologist  Preventative health issues were discussed.   Additional concerns:chronic heartburn- pepcid helps but it never completely goes away    Review of Systems  Constitutional: Negative for diaphoresis and fatigue.  HENT: Negative for congestion and rhinorrhea.   Respiratory: Negative for cough and shortness of breath.   Cardiovascular: Negative for chest pain and leg swelling.  Gastrointestinal: Negative for abdominal pain and diarrhea.  Skin: Negative for color change and rash.  Neurological: Negative for dizziness and headaches.  Psychiatric/Behavioral: Negative for behavioral problems and confusion.       Objective:   Physical Exam Vitals reviewed.  Constitutional:      General: He is not in acute distress.    Appearance: He is well-nourished.  HENT:     Head: Normocephalic and atraumatic.  Eyes:     General:        Right eye: No discharge.        Left eye: No discharge.  Neck:     Trachea: No tracheal deviation.  Cardiovascular:     Rate and Rhythm: Normal rate and regular rhythm.     Heart sounds: Normal heart sounds. No murmur heard.   Pulmonary:     Effort: Pulmonary effort is normal. No respiratory distress.     Breath sounds: Normal breath sounds.  Musculoskeletal:        General: No edema.  Lymphadenopathy:     Cervical: No cervical  adenopathy.  Skin:    General: Skin is warm and dry.  Neurological:     Mental Status: He is alert.     Coordination: Coordination normal.  Psychiatric:        Mood and Affect: Mood and affect normal.        Behavior: Behavior normal.   Prostate exam normal         Assessment & Plan:  1. Encounter for subsequent annual wellness visit (AWV) in Medicare patient Adult wellness-complete.wellness physical was conducted today. Importance of diet and exercise were discussed in detail.  In addition to this a discussion regarding safety was also covered. We also reviewed over immunizations and gave recommendations regarding current immunization needed for age.  In addition to this additional areas were also touched on including: Preventative health exams needed:  Colonoscopy 2023  Patient was advised yearly wellness exam   2. Hyperlipidemia, unspecified hyperlipidemia type LDL not at goal we need to see it below 70 increase cholesterol medicine atorvastatin 40 mg recheck lab work later this year  3. Coronary artery disease involving native heart without angina pectoris, unspecified vessel or lesion type Healthy eating regular physical activity blood pressure under good control continue current medications see cardiologist on a regular basis Also we will continue to monitor his cholesterol  Reflux We did discuss dietary measures for this Also recommend prescription strength Pepcid If he does not see  dramatic improvement with this over the course of the next several weeks he is to let us know we would move to a PPI at that point if still no improvement then EGD patient denies any dysphagia

## 2021-01-03 NOTE — Patient Instructions (Addendum)
Shingrix and shingles prevention: know the facts!   Shingrix is a very effective vaccine to prevent shingles.   Shingles is a reactivation of chickenpox -more than 99% of Americans born before 1980 have had chickenpox even if they do not remember it. One in every 10 people who get shingles have severe long-lasting nerve pain as a result.   33 out of a 100 older adults will get shingles if they are unvaccinated.     This vaccine is very important for your health This vaccine is indicated for anyone 50 years or older. You can get this vaccine even if you have already had shingles because you can get the disease more than once in a lifetime.  Your risk for shingles and its complications increases with age.  This vaccine has 2 doses.  The second dose would be 2 to 6 months after the first dose.  If you had Zostavax vaccine in the past you should still get Shingrix. ( Zostavax is only 70% effective and it loses significant strength over a few years .)  This vaccine is given through the pharmacy.  The cost of the vaccine is through your insurance. The pharmacy can inform you of the total costs.  Common side effects including soreness in the arm, some redness and swelling, also some feel fatigue muscle soreness headache low-grade fever.  Side effects typically go away within 2 to 3 days. Remember-the pain from shingles can last a lifetime but these side effects of the vaccine will only last a few days at most. It is very important to get both doses in order to protect yourself fully.   Please get this vaccine at your earliest convenience at your trusted pharmacy.       Food Choices for Gastroesophageal Reflux Disease, Adult When you have gastroesophageal reflux disease (GERD), the foods you eat and your eating habits are very important. Choosing the right foods can help ease your discomfort. Think about working with a food expert (dietitian) to help you make good choices. What are tips  for following this plan? Reading food labels  Look for foods that are low in saturated fat. Foods that may help with your symptoms include: ? Foods that have less than 5% of daily value (DV) of fat. ? Foods that have 0 grams of trans fat. Cooking  Do not fry your food.  Cook your food by baking, steaming, grilling, or broiling. These are all methods that do not need a lot of fat for cooking.  To add flavor, try to use herbs that are low in spice and acidity. Meal planning  Choose healthy foods that are low in fat, such as: ? Fruits and vegetables. ? Whole grains. ? Low-fat dairy products. ? Lean meats, fish, and poultry.  Eat small meals often instead of eating 3 large meals each day. Eat your meals slowly in a place where you are relaxed. Avoid bending over or lying down until 2-3 hours after eating.  Limit high-fat foods such as fatty meats or fried foods.  Limit your intake of fatty foods, such as oils, butter, and shortening.  Avoid the following as told by your doctor: ? Foods that cause symptoms. These may be different for different people. Keep a food diary to keep track of foods that cause symptoms. ? Alcohol. ? Drinking a lot of liquid with meals. ? Eating meals during the 2-3 hours before bed.   Lifestyle  Stay at a healthy weight. Ask your doctor what weight  is healthy for you. If you need to lose weight, work with your doctor to do so safely.  Exercise for at least 30 minutes on 5 or more days each week, or as told by your doctor.  Wear loose-fitting clothes.  Do not smoke or use any products that contain nicotine or tobacco. If you need help quitting, ask your doctor.  Sleep with the head of your bed higher than your feet. Use a wedge under the mattress or blocks under the bed frame to raise the head of the bed.  Chew sugar-free gum after meals. What foods should eat? Eat a healthy, well-balanced diet of fruits, vegetables, whole grains, low-fat dairy  products, lean meats, fish, and poultry. Each person is different. Foods that may cause symptoms in one person may not cause any symptoms in another person. Work with your doctor to find foods that are safe for you. The items listed above may not be a complete list of what you can eat and drink. Contact a food expert for more options.   What foods should I avoid? Limiting some of these foods may help in managing the symptoms of GERD. Everyone is different. Talk with a food expert or your doctor to help you find the exact foods to avoid, if any. Fruits Any fruits prepared with added fat. Any fruits that cause symptoms. For some people, this may include citrus fruits, such as oranges, grapefruit, pineapple, and lemons. Vegetables Deep-fried vegetables. [place] fries. Any vegetables prepared with added fat. Any vegetables that cause symptoms. For some people, this may include tomatoes and tomato products, chili peppers, onions and garlic, and horseradish. Grains Pastries or quick breads with added fat. Meats and other proteins High-fat meats, such as fatty beef or pork, hot dogs, ribs, ham, sausage, salami, and bacon. Fried meat or protein, including fried fish and fried chicken. Nuts and nut butters, in large amounts. Dairy Whole milk and chocolate milk. Sour cream. Cream. Ice cream. Cream cheese. Milkshakes. Fats and oils Butter. Margarine. Shortening. Ghee. Beverages Coffee and tea, with or without caffeine. Carbonated beverages. Sodas. Energy drinks. Fruit juice made with acidic fruits, such as orange or grapefruit. Tomato juice. Alcoholic drinks. Sweets and desserts Chocolate and cocoa. Donuts. Seasonings and condiments Pepper. Peppermint and spearmint. Added salt. Any condiments, herbs, or seasonings that cause symptoms. For some people, this may include curry, hot sauce, or vinegar-based salad dressings. The items listed above may not be a complete list of what you should not eat and drink.  Contact a food expert for more options. Questions to ask your doctor Diet and lifestyle changes are often the first steps that are taken to manage symptoms of GERD. If diet and lifestyle changes do not help, talk with your doctor about taking medicines. Where to find more information  International Foundation for Gastrointestinal Disorders: aboutgerd.org Summary  When you have GERD, food and lifestyle choices are very important in easing your symptoms.  Eat small meals often instead of 3 large meals a day. Eat your meals slowly and in a place where you are relaxed.  Avoid bending over or lying down until 2-3 hours after eating.  Limit high-fat foods such as fatty meats or fried foods. This information is not intended to replace advice given to you by your health care provider. Make sure you discuss any questions you have with your health care provider. Document Revised: 05/16/2020 Document Reviewed: 05/16/2020 Elsevier Patient Education  2021 [employer].  Results for orders placed or performed  in visit on 12/27/20  Lipid Profile  Result Value Ref Range   Cholesterol, Total 146 100 - 199 mg/dL   Triglycerides 86 0 - 149 mg/dL   HDL 56 >39 mg/dL   VLDL Cholesterol Cal 16 5 - 40 mg/dL   LDL Chol Calc (NIH) 74 0 - 99 mg/dL   Chol/HDL Ratio 2.6 0.0 - 5.0 ratio  Comprehensive Metabolic Panel (CMET)  Result Value Ref Range   Glucose 97 65 - 99 mg/dL   BUN 21 8 - 27 mg/dL   Creatinine, Ser 1.42 (H) 0.76 - 1.27 mg/dL   GFR calc non Af Amer 48 (L) >59 mL/min/1.73   GFR calc Af Amer 56 (L) >59 mL/min/1.73   BUN/Creatinine Ratio 15 10 - 24   Sodium 145 (H) 134 - 144 mmol/L   Potassium 4.7 3.5 - 5.2 mmol/L   Chloride 104 96 - 106 mmol/L   CO2 22 20 - 29 mmol/L   Calcium 9.2 8.6 - 10.2 mg/dL   Total Protein 6.6 6.0 - 8.5 g/dL   Albumin 4.4 3.7 - 4.7 g/dL   Globulin, Total 2.2 1.5 - 4.5 g/dL   Albumin/Globulin Ratio 2.0 1.2 - 2.2   Bilirubin Total 0.4 0.0 - 1.2 mg/dL   Alkaline  Phosphatase 97 44 - 121 IU/L   AST 25 0 - 40 IU/L   ALT 23 0 - 44 IU/L  PSA  Result Value Ref Range   Prostate Specific Ag, Serum 0.4 0.0 - 4.0 ng/mL  Urine Microalbumin w/creat. ratio  Result Value Ref Range   Creatinine, Urine 156.8 Not Estab. mg/dL   Microalbumin, Urine 12.2 Not Estab. ug/mL   Microalb/Creat Ratio 8 0 - 29 mg/g creat  Urinalysis  Result Value Ref Range   Specific Gravity, UA 1.024 1.005 - 1.030   pH, UA 5.5 5.0 - 7.5   Color, UA Yellow Yellow   Appearance Ur Clear Clear   Leukocytes,UA Negative Negative   Protein,UA Negative Negative/Trace   Glucose, UA Negative Negative   Ketones, UA Negative Negative   RBC, UA Negative Negative   Bilirubin, UA Negative Negative   Urobilinogen, Ur 0.2 0.2 - 1.0 mg/dL   Nitrite, UA Negative Negative

## 2021-01-04 ENCOUNTER — Encounter: Payer: Self-pay | Admitting: Cardiovascular Disease

## 2021-01-04 ENCOUNTER — Ambulatory Visit (INDEPENDENT_AMBULATORY_CARE_PROVIDER_SITE_OTHER): Payer: Medicare Other | Admitting: Cardiovascular Disease

## 2021-01-04 VITALS — BP 128/58 | HR 64 | Ht 72.0 in | Wt 197.8 lb

## 2021-01-04 DIAGNOSIS — I10 Essential (primary) hypertension: Secondary | ICD-10-CM

## 2021-01-04 DIAGNOSIS — I2510 Atherosclerotic heart disease of native coronary artery without angina pectoris: Secondary | ICD-10-CM

## 2021-01-04 NOTE — Patient Instructions (Signed)
Medication Instructions:  Your physician recommends that you continue on your current medications as directed. Please refer to the Current Medication list given to you today.  *If you need a refill on your cardiac medications before your next appointment, please call your pharmacy*   Lab Work: NONE   If you have labs (blood work) drawn today and your tests are completely normal, you will receive your results only by: . MyChart Message (if you have MyChart) OR . A paper copy in the mail If you have any lab test that is abnormal or we need to change your treatment, we will call you to review the results.   Testing/Procedures: NONE    Follow-Up: At [facility], you and your health needs are our priority.  As part of our continuing mission to provide you with exceptional heart care, we have created designated Provider Care Teams.  These Care Teams include your primary Cardiologist (physician) and Advanced Practice Providers (APPs -  Physician Assistants and Nurse Practitioners) who all work together to provide you with the care you need, when you need it.  We recommend signing up for the patient portal called "MyChart".  Sign up information is provided on this After Visit Summary.  MyChart is used to connect with patients for Virtual Visits (Telemedicine).  Patients are able to view lab/test results, encounter notes, upcoming appointments, etc.  Non-urgent messages can be sent to your provider as well.   To learn more about what you can do with MyChart, go to [email or web address].[email or web address].    Your next appointment:   1 year(s)  The format for your next appointment:   In Person  Provider:   Peter Nishan, MD   Other Instructions Thank you for choosing [facility]!    

## 2021-04-10 ENCOUNTER — Other Ambulatory Visit: Payer: Self-pay | Admitting: *Deleted

## 2021-04-10 MED ORDER — METOPROLOL TARTRATE 25 MG PO TABS: 12.5000 mg | ORAL_TABLET | Freq: Two times a day (BID) | ORAL | 3 refills | Status: DC

## 2021-07-03 ENCOUNTER — Other Ambulatory Visit: Payer: Self-pay | Admitting: Family Medicine

## 2021-07-03 NOTE — Telephone Encounter (Signed)
May have refills follow-up later this fall

## 2021-08-14 ENCOUNTER — Encounter: Payer: Self-pay | Admitting: Family Medicine

## 2021-08-14 DIAGNOSIS — M25552 Pain in left hip: Secondary | ICD-10-CM

## 2021-08-14 NOTE — Telephone Encounter (Signed)
Nurses  As for the hip issue Dr. Aline Brochure locally would be a good choice Dr.Alusio in [place] would also be a good choice  Also his PCP should be listed as myself   Thanks-Dr. Nicki Reaper

## 2021-08-18 NOTE — Addendum Note (Signed)
Addended by: Vicente Males on: 08/18/2021 02:48 PM   Modules accepted: Orders

## 2021-08-28 ENCOUNTER — Ambulatory Visit (INDEPENDENT_AMBULATORY_CARE_PROVIDER_SITE_OTHER): Payer: Medicare Other | Admitting: Orthopedic Surgery

## 2021-08-28 ENCOUNTER — Encounter: Payer: Self-pay | Admitting: Orthopedic Surgery

## 2021-08-28 ENCOUNTER — Other Ambulatory Visit: Payer: Self-pay

## 2021-08-28 ENCOUNTER — Ambulatory Visit: Payer: Medicare Other

## 2021-08-28 VITALS — BP 167/73 | HR 82 | Ht 72.0 in | Wt 202.0 lb

## 2021-08-28 DIAGNOSIS — I2510 Atherosclerotic heart disease of native coronary artery without angina pectoris: Secondary | ICD-10-CM | POA: Diagnosis not present

## 2021-08-28 DIAGNOSIS — M25552 Pain in left hip: Secondary | ICD-10-CM

## 2021-08-28 DIAGNOSIS — M25551 Pain in right hip: Secondary | ICD-10-CM

## 2021-08-28 DIAGNOSIS — M545 Low back pain, unspecified: Secondary | ICD-10-CM

## 2021-08-28 DIAGNOSIS — M48061 Spinal stenosis, lumbar region without neurogenic claudication: Secondary | ICD-10-CM | POA: Diagnosis not present

## 2021-08-28 MED ORDER — GABAPENTIN 100 MG PO CAPS: 100.0000 mg | ORAL_CAPSULE | Freq: Every day | ORAL | 2 refills | Status: DC

## 2021-08-28 NOTE — Progress Notes (Signed)
Chief Complaint  Patient presents with   Hip Pain    Left hip    Roger [name] is 75 years old he comes in with intermittent left hip pain localized to the left buttock.  He says its been hurting for years but it is intermittent.  He says he can walk 1/4 to 1-1/2 miles and then when he gets to the heel and starts up but the pain increases.  The pain did not respond to Tylenol  The pain does not occur in the groin or lower back and there are no symptoms in the foot  BP (!) 167/73   Pulse 82   Ht 6' (1.829 m)   Wt 202 lb (91.6 kg)   BMI 27.40 kg/m   He is awake and alert he is oriented x3 body habitus is normal no developmental abnormalities  Normal range of motion in his left hip there is some tenderness right at the greater trochanter but this does not exacerbate with abduction.  He has no tenderness in his back.  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  Meds ordered this encounter  Medications   gabapentin (NEURONTIN) 100 MG capsule    Sig: Take 1 capsule (100 mg total) by mouth daily.    Dispense:  60 capsule    Refill:  2

## 2021-08-30 ENCOUNTER — Ambulatory Visit: Payer: 59 | Admitting: Orthopedic Surgery

## 2021-10-02 ENCOUNTER — Other Ambulatory Visit: Payer: Self-pay | Admitting: Family Medicine

## 2021-10-02 NOTE — Telephone Encounter (Signed)
Sent my chart message to schedule medication follow up 10/02/2021

## 2021-10-03 ENCOUNTER — Telehealth: Payer: Self-pay | Admitting: Family Medicine

## 2021-10-04 ENCOUNTER — Telehealth: Payer: Self-pay

## 2021-10-04 NOTE — Telephone Encounter (Signed)
Error

## 2021-10-04 NOTE — Telephone Encounter (Signed)
Sent my chart message to schedule appointment.

## 2021-10-05 ENCOUNTER — Telehealth: Payer: Self-pay | Admitting: Family Medicine

## 2021-10-05 MED ORDER — ATORVASTATIN CALCIUM 40 MG PO TABS: ORAL_TABLET | ORAL | 0 refills | Status: DC

## 2021-10-05 NOTE — Telephone Encounter (Signed)
Patient has appointment 12/5 but needs refill on atorvastatin 40 mg called into [facility]

## 2021-10-05 NOTE — Telephone Encounter (Signed)
Prescription sent electronically to pharmacy. Patient notified. 

## 2021-10-09 ENCOUNTER — Other Ambulatory Visit: Payer: Self-pay

## 2021-10-09 ENCOUNTER — Ambulatory Visit (INDEPENDENT_AMBULATORY_CARE_PROVIDER_SITE_OTHER): Payer: Medicare Other | Admitting: Orthopedic Surgery

## 2021-10-09 DIAGNOSIS — I2510 Atherosclerotic heart disease of native coronary artery without angina pectoris: Secondary | ICD-10-CM

## 2021-10-09 DIAGNOSIS — M545 Low back pain, unspecified: Secondary | ICD-10-CM | POA: Diagnosis not present

## 2021-10-09 DIAGNOSIS — M48061 Spinal stenosis, lumbar region without neurogenic claudication: Secondary | ICD-10-CM | POA: Diagnosis not present

## 2021-10-09 MED ORDER — PREDNISONE 10 MG (48) PO TBPK: ORAL_TABLET | Freq: Every day | ORAL | 0 refills | Status: DC

## 2021-10-09 NOTE — Patient Instructions (Signed)
For MRI please go ahead and call to schedule your appointment with [facility] within at least one (1) week.   [facility] [phone]

## 2021-10-09 NOTE — Progress Notes (Signed)
Chief Complaint  Patient presents with   Leg Pain    LT/ follow up A little better but not much. Pain hasn't dissipated    It seems hes not much better  History of present illness from last visit  Roger [name] is 75 years old he comes in with intermittent left hip pain localized to the left buttock.  He says its been hurting for years but it is intermittent.  He says he can walk 1/4 to 1-1/2 miles and then when he gets to the heel and starts up but the pain increases.  The pain did not respond to Tylenol  The pain does not occur in the groin or lower back and there are no symptoms in the foot  Says the medicine helped a little bit but now he is getting weakness and giving way symptoms of the left lower extremity  I rec a prednisone dose pack and Lumbar MRI   Meds ordered this encounter  Medications   predniSONE (STERAPRED UNI-PAK 48 TAB) 10 MG (48) TBPK tablet    Sig: Take by mouth daily. 10 mg double strength 12 days as directed    Dispense:  48 tablet    Refill:  0

## 2021-10-09 NOTE — Telephone Encounter (Signed)
Scheduled appointment 10/23/21

## 2021-10-23 ENCOUNTER — Ambulatory Visit (INDEPENDENT_AMBULATORY_CARE_PROVIDER_SITE_OTHER): Payer: Medicare Other | Admitting: Family Medicine

## 2021-10-23 ENCOUNTER — Other Ambulatory Visit: Payer: Self-pay

## 2021-10-23 VITALS — BP 132/82 | HR 60 | Temp 97.7°F | Ht 72.0 in | Wt 199.6 lb

## 2021-10-23 DIAGNOSIS — E785 Hyperlipidemia, unspecified: Secondary | ICD-10-CM | POA: Diagnosis not present

## 2021-10-23 DIAGNOSIS — N183 Chronic kidney disease, stage 3 unspecified: Secondary | ICD-10-CM | POA: Diagnosis not present

## 2021-10-23 DIAGNOSIS — I2510 Atherosclerotic heart disease of native coronary artery without angina pectoris: Secondary | ICD-10-CM | POA: Diagnosis not present

## 2021-10-23 DIAGNOSIS — I10 Essential (primary) hypertension: Secondary | ICD-10-CM

## 2021-10-23 NOTE — Assessment & Plan Note (Signed)
Stable.  Continue aspirin, statin, lisinopril, and metoprolol.

## 2021-10-23 NOTE — Assessment & Plan Note (Signed)
Metabolic panel today.  We will continue to monitor closely.

## 2021-10-23 NOTE — Assessment & Plan Note (Signed)
Stable.  Continue lisinopril and metoprolol. 

## 2021-10-23 NOTE — Progress Notes (Signed)
Subjective:  Patient ID: Roger [name], male    DOB: 05/25/1946  Age: 75 y.o. MRN: [id number]  CC: Chief Complaint  Patient presents with   Hyperlipidemia    Follow up - needs lab work Establish care No problems or concerns    HPI:  75 year old male with CAD, hypertension, hyperlipidemia, CKD presents for follow-up.  CAD Status post PCI and drug-eluting stent to the LAD. Follows with cardiology. Stable.  No chest pain or shortness of breath.  Doing well.  He is compliant with aspirin, statin, lisinopril, and metoprolol.  Hypertension BP has been stable.  He is compliant with lisinopril, metoprolol.  No hypotension.  Hyperlipidemia Desires lipid panel today.  His last LDL was 74.  He is compliant with atorvastatin 20 mg daily.  No reported side effects. We discussed keeping his LDL below 70 and possibly below 55.  Patient would like to not change his therapy at this time.  CKD stage III Most recent creatinine was 1.42.   Patient Active Problem List   Diagnosis Date Noted   Presbycusis of both ears 10/11/2020   Tinnitus of both ears 10/06/2020   Stage 3 chronic kidney disease ([place]) 03/03/2020   Essential hypertension 07/17/2014   Hyperlipidemia 07/17/2014   CAD- s/p PCI + DES to mid LAD 07/16/14 07/17/2014   Adenomatous polyps 01/30/2012    Social Hx   Social History   Socioeconomic History   Marital status: Married    Spouse name: Not on file   Number of children: Not on file   Years of education: Not on file   Highest education level: Not on file  Occupational History   Not on file  Tobacco Use   Smoking status: Never   Smokeless tobacco: Never  Vaping Use   Vaping Use: Never used  Substance and Sexual Activity   Alcohol use: Yes    Alcohol/week: 2.0 standard drinks    Types: 2 Glasses of wine per week   Drug use: No   Sexual activity: Yes  Other Topics Concern   Not on file  Social History Narrative   Not on file   Social Determinants of Health    Financial Resource Strain: Not on file  Food Insecurity: Not on file  Transportation Needs: Not on file  Physical Activity: Not on file  Stress: Not on file  Social Connections: Not on file    Review of Systems  Constitutional: Negative.   Respiratory: Negative.    Cardiovascular: Negative.     Objective:  BP 132/82   Pulse 60   Temp 97.7 F (36.5 C) (Oral)   Ht 6' (1.829 m)   Wt 199 lb 9.6 oz (90.5 kg)   SpO2 99%   BMI 27.07 kg/m   BP/Weight 10/23/2021 08/28/2021 0/86/5784  Systolic BP 696 295 284  Diastolic BP 82 73 58  Wt. (Lbs) 199.6 202 197.8  BMI 27.07 27.4 26.83    Physical Exam Vitals and nursing note reviewed.  Constitutional:      General: He is not in acute distress.    Appearance: Normal appearance. He is not ill-appearing.  HENT:     Head: Normocephalic and atraumatic.  Eyes:     General:        Right eye: No discharge.        Left eye: No discharge.     Conjunctiva/sclera: Conjunctivae normal.  Cardiovascular:     Rate and Rhythm: Normal rate and regular rhythm.  Pulmonary:  Effort: Pulmonary effort is normal.     Breath sounds: Normal breath sounds. No wheezing, rhonchi or rales.  Neurological:     Mental Status: He is alert.  Psychiatric:        Mood and Affect: Mood normal.        Behavior: Behavior normal.    Lab Results  Component Value Date   WBC 6.8 07/17/2014   HGB 14.8 07/17/2014   HCT 42.8 07/17/2014   PLT 177 07/17/2014   GLUCOSE 97 12/29/2020   CHOL 146 12/29/2020   TRIG 86 12/29/2020   HDL 56 12/29/2020   LDLCALC 74 12/29/2020   ALT 23 12/29/2020   AST 25 12/29/2020   NA 145 (H) 12/29/2020   K 4.7 12/29/2020   CL 104 12/29/2020   CREATININE 1.42 (H) 12/29/2020   BUN 21 12/29/2020   CO2 22 12/29/2020   PSA 0.39 10/19/2014   INR 0.98 07/14/2014     Assessment & Plan:   Problem List Items Addressed This Visit       Cardiovascular and Mediastinum   CAD- s/p PCI + DES to mid LAD 07/16/14 (Chronic)     Stable.  Continue aspirin, statin, lisinopril, and metoprolol.      Essential hypertension - Primary    Stable.  Continue lisinopril and metoprolol.        Genitourinary   Stage 3 chronic kidney disease (HCC)    Metabolic panel today.  We will continue to monitor closely.      Relevant Orders   CMP14+EGFR     Other   Hyperlipidemia    Lipid panel today.  Discussed increasing Lipitor.  Patient declines.      Relevant Orders   Lipid panel    Follow-up:  Return in about 6 months (around 04/23/2022) for Follow up Chronic medical issues.  [place]

## 2021-10-23 NOTE — Patient Instructions (Signed)
Continue your current medications.  Labs today.  Follow up in 6 months.

## 2021-10-23 NOTE — Assessment & Plan Note (Signed)
Lipid panel today.  Discussed increasing Lipitor.  Patient declines.

## 2021-10-24 LAB — CMP14+EGFR
ALT: 38 IU/L (ref 0–44)
AST: 23 IU/L (ref 0–40)
Albumin/Globulin Ratio: 2 (ref 1.2–2.2)
Albumin: 4.2 g/dL (ref 3.7–4.7)
Alkaline Phosphatase: 85 IU/L (ref 44–121)
BUN/Creatinine Ratio: 16 (ref 10–24)
BUN: 23 mg/dL (ref 8–27)
Bilirubin Total: 0.8 mg/dL (ref 0.0–1.2)
CO2: 25 mmol/L (ref 20–29)
Calcium: 8.7 mg/dL (ref 8.6–10.2)
Chloride: 104 mmol/L (ref 96–106)
Creatinine, Ser: 1.4 mg/dL — ABNORMAL HIGH (ref 0.76–1.27)
Globulin, Total: 2.1 g/dL (ref 1.5–4.5)
Glucose: 84 mg/dL (ref 70–99)
Potassium: 4.2 mmol/L (ref 3.5–5.2)
Sodium: 141 mmol/L (ref 134–144)
Total Protein: 6.3 g/dL (ref 6.0–8.5)
eGFR: 52 mL/min/{1.73_m2} — ABNORMAL LOW (ref >59)

## 2021-10-24 LAB — LIPID PANEL
Chol/HDL Ratio: 2.3 ratio (ref 0.0–5.0)
Cholesterol, Total: 162 mg/dL (ref 100–199)
HDL: 71 mg/dL (ref >39)
LDL Chol Calc (NIH): 69 mg/dL (ref 0–99)
Triglycerides: 125 mg/dL (ref 0–149)
VLDL Cholesterol Cal: 22 mg/dL (ref 5–40)

## 2021-10-30 ENCOUNTER — Other Ambulatory Visit: Payer: Self-pay

## 2021-10-30 ENCOUNTER — Ambulatory Visit (INDEPENDENT_AMBULATORY_CARE_PROVIDER_SITE_OTHER): Payer: Medicare Other | Admitting: Orthopedic Surgery

## 2021-10-30 ENCOUNTER — Encounter: Payer: Self-pay | Admitting: Orthopedic Surgery

## 2021-10-30 DIAGNOSIS — M48061 Spinal stenosis, lumbar region without neurogenic claudication: Secondary | ICD-10-CM | POA: Diagnosis not present

## 2021-10-30 DIAGNOSIS — M5136 Other intervertebral disc degeneration, lumbar region: Secondary | ICD-10-CM

## 2021-10-30 DIAGNOSIS — I2510 Atherosclerotic heart disease of native coronary artery without angina pectoris: Secondary | ICD-10-CM | POA: Diagnosis not present

## 2021-10-30 NOTE — Progress Notes (Signed)
MRI Spine  Lumbar WO IV Contrast  Anatomical Region Laterality Modality  T-spine -- Magnetic Resonance  L-spine -- --  Pelvis -- --   Impression  IMPRESSION:   Multilevel mild to moderate degenerative changes.   There is mild stenosis of the right lateral recess and right neural foramen at L3-4.   There is mild spinal stenosis and moderate stenosis of the right lateral recess at L4-5.   There is no specific cause for a left leg radiculopathy seen.     Electronically Signed by: Michael Boston on 10/25/2021 10:48 AM Narrative  EXAMINATION: MRI lumbar spine without contrast   CLINICAL INDICATION: Low back pain, left hip pain, numbness and tingling   TECHNIQUE: MRI lumbar spine protocol without contrast.   COMPARISON: None   FINDINGS:   Bone marrow signal: Normal   Conus medullaris and cauda equina: Normal   L1-L2: Mild degenerative disc disease. Otherwise normal   L2-L3: The disc is normal. There is mild facet arthropathy with mild facet enlargement. No spinal stenosis or foraminal stenosis   L3-L4: There is slight desiccation of the disc with a mild disc bulge, slightly eccentric to the right. There is mild facet arthropathy. There is mild stenosis of the right lateral recess and right neural foramen   L4-L5: There is mild degenerative disc disease with a mild disc bulge, eccentric to the right. There is mild facet arthropathy with facet enlargement. There is mild spinal stenosis with moderate stenosis of the right lateral recess. No significant neuroforaminal stenosis   L5-S1: There is a mild disc bulge. There is bilateral facet arthropathy, worse on the right. There is a mild stenosis of the right neural foramen. No spinal stenosis. Procedure Note  Rozanna Box, MD - 10/25/2021  Formatting of this note might be different from the original.  EXAMINATION: MRI lumbar spine without contrast   CLINICAL INDICATION: Low back pain, left hip pain, numbness and tingling    TECHNIQUE: MRI lumbar spine protocol without contrast.   COMPARISON: None   FINDINGS:   Bone marrow signal: Normal   Conus medullaris and cauda equina: Normal   L1-L2: Mild degenerative disc disease. Otherwise normal   L2-L3: The disc is normal. There is mild facet arthropathy with mild facet enlargement. No spinal stenosis or foraminal stenosis   L3-L4: There is slight desiccation of the disc with a mild disc bulge, slightly eccentric to the right. There is mild facet arthropathy. There is mild stenosis of the right lateral recess and right neural foramen   L4-L5: There is mild degenerative disc disease with a mild disc bulge, eccentric to the right. There is mild facet arthropathy with facet enlargement. There is mild spinal stenosis with moderate stenosis of the right lateral recess. No significant neuroforaminal stenosis   L5-S1: There is a mild disc bulge. There is bilateral facet arthropathy, worse on the right. There is a mild stenosis of the right neural foramen. No spinal stenosis.    IMPRESSION:   Multilevel mild to moderate degenerative changes.   There is mild stenosis of the right lateral recess and right neural foramen at L3-4.   There is mild spinal stenosis and moderate stenosis of the right lateral recess at L4-5.   There is no specific cause for a left leg radiculopathy seen.     Electronically Signed by: Michael Boston on 10/25/2021 10:48 AM Resulting Agency Comment  [id number] Exam End: 10/24/21

## 2021-10-30 NOTE — Progress Notes (Signed)
FOLLOW UP   Encounter Diagnoses  Name Primary?   Degenerative disc disease, lumbar Yes   Foraminal stenosis of lumbar region      Chief Complaint  Patient presents with   Back Pain   Results    Review MRI [facility]   Roger [name] had the MRI of his lumbar spine but he says he is much better now he just has a little bit of discomfort when he twists.  He was having trouble walking up a hill that he walks for exercise but now he can do that  Unclear if this will come back but most likely will.  I advised him to come back or call us for an appointment if things worsen again but his MRI as noted below basically shows degenerative disc disease  IMPRESSION:   Multilevel mild to moderate degenerative changes.   There is mild stenosis of the right lateral recess and right neural foramen at L3-4.   There is mild spinal stenosis and moderate stenosis of the right lateral recess at L4-5.   There is no specific cause for a left leg radiculopathy seen.     Electronically Signed by: Roger [name] on 10/25/2021 10:48 AM Narrative   EXAMINATION: MRI lumbar spine without contrast   CLINICAL INDICATION: Low back pain, left hip pain, numbness and tingling   TECHNIQUE: MRI lumbar spine protocol without contrast.   COMPARISON: None   FINDINGS:   Bone marrow signal: Normal   Conus medullaris and cauda equina: Normal   L1-L2: Mild degenerative disc disease. Otherwise normal   L2-L3: The disc is normal. There is mild facet arthropathy with mild facet enlargement. No spinal stenosis or foraminal stenosis   L3-L4: There is slight desiccation of the disc with a mild disc bulge, slightly eccentric to the right. There is mild facet arthropathy. There is mild stenosis of the right lateral recess and right neural foramen   L4-L5: There is mild degenerative disc disease with a mild disc bulge, eccentric to the right. There is mild facet arthropathy with facet enlargement. There is mild spinal  stenosis with moderate stenosis of the right lateral recess. No significant neuroforaminal stenosis   L5-S1: There is a mild disc bulge. There is bilateral facet arthropathy, worse on the right. There is a mild stenosis of the right neural foramen. No spinal stenosis. Procedure Note   Roger Box, MD - 10/25/2021

## 2021-11-06 NOTE — Telephone Encounter (Signed)
Had appointment on 10/23/21 for medication follow up

## 2021-11-20 ENCOUNTER — Encounter: Payer: Self-pay | Admitting: Family Medicine

## 2021-11-21 MED ORDER — LISINOPRIL 10 MG PO TABS: ORAL_TABLET | ORAL | 1 refills | Status: DC

## 2021-11-28 ENCOUNTER — Telehealth: Payer: Self-pay | Admitting: Family Medicine

## 2021-11-28 MED ORDER — NITROGLYCERIN 0.4 MG SL SUBL: SUBLINGUAL_TABLET | SUBLINGUAL | 1 refills | Status: DC

## 2021-11-28 NOTE — Telephone Encounter (Signed)
Refills sent to pharmacy. 

## 2021-11-28 NOTE — Telephone Encounter (Signed)
[facility] requesting refill on Nitroglycerin 0.4 mg tablets. One tablet under the tongue every 5 min prn chest pain. Max 3 doses in 15 min. Pt last seen 10/23/21 for HTN. Please advise. Thank you

## 2021-11-28 NOTE — Addendum Note (Signed)
Addended by: Vicente Males on: 11/28/2021 11:51 AM   Modules accepted: Orders

## 2021-12-14 DIAGNOSIS — I25118 Atherosclerotic heart disease of native coronary artery with other forms of angina pectoris: Secondary | ICD-10-CM | POA: Diagnosis not present

## 2021-12-14 DIAGNOSIS — G8929 Other chronic pain: Secondary | ICD-10-CM | POA: Diagnosis not present

## 2021-12-14 DIAGNOSIS — E785 Hyperlipidemia, unspecified: Secondary | ICD-10-CM | POA: Diagnosis not present

## 2021-12-14 DIAGNOSIS — N1832 Chronic kidney disease, stage 3b: Secondary | ICD-10-CM | POA: Diagnosis not present

## 2021-12-14 DIAGNOSIS — H93A3 Pulsatile tinnitus, bilateral: Secondary | ICD-10-CM | POA: Diagnosis not present

## 2021-12-14 DIAGNOSIS — I25709 Atherosclerosis of coronary artery bypass graft(s), unspecified, with unspecified angina pectoris: Secondary | ICD-10-CM | POA: Diagnosis not present

## 2021-12-14 DIAGNOSIS — I2510 Atherosclerotic heart disease of native coronary artery without angina pectoris: Secondary | ICD-10-CM | POA: Diagnosis not present

## 2021-12-14 DIAGNOSIS — E663 Overweight: Secondary | ICD-10-CM | POA: Diagnosis not present

## 2021-12-14 DIAGNOSIS — H9193 Unspecified hearing loss, bilateral: Secondary | ICD-10-CM | POA: Diagnosis not present

## 2021-12-14 DIAGNOSIS — I129 Hypertensive chronic kidney disease with stage 1 through stage 4 chronic kidney disease, or unspecified chronic kidney disease: Secondary | ICD-10-CM | POA: Diagnosis not present

## 2021-12-14 DIAGNOSIS — K219 Gastro-esophageal reflux disease without esophagitis: Secondary | ICD-10-CM | POA: Diagnosis not present

## 2021-12-14 DIAGNOSIS — I10 Essential (primary) hypertension: Secondary | ICD-10-CM | POA: Diagnosis not present

## 2021-12-27 ENCOUNTER — Encounter: Payer: Self-pay | Admitting: Family Medicine

## 2021-12-27 DIAGNOSIS — Z125 Encounter for screening for malignant neoplasm of prostate: Secondary | ICD-10-CM

## 2021-12-27 DIAGNOSIS — I10 Essential (primary) hypertension: Secondary | ICD-10-CM

## 2021-12-28 NOTE — Progress Notes (Signed)
Primary Cardiologist:  Bronson Ing New to me   SUBJECTIVE:  76 y.o. f/u CAD. DES to [place] in 2015. D1 jailed at time His EF was normal by  TTE 2015 with trivial AR/MR He is on beta blocker, ACE and statin along with Baby aspirin He has CRF with baseline Cr of 1.4  He is very active with no angina Had classic angina prior to his stent Has daughter with 3 kids local and son with a child He is retired from TEFL teacher to travel Writer of [employer] and big fan of school   Primary [place] checks labs  Back problems  MRI done 10/25/21 with multilevel mild/moderate degenerative dx but only  Mild stenosis L3-5 Sees Dr Aline Brochure    Soc Hx: He graduated from [employer] with a degree in Charity fundraiser.  He worked in the [employer] for 38 years.  His sister-in-law, Manus Gunning, is a Cabin crew in [place].  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  Review of Systems: As per "subjective", otherwise negative.  No Known Allergies  Current Outpatient Medications  Medication Sig Dispense Refill   acetaminophen (TYLENOL) 500 MG tablet Take 500-1,000 mg by mouth every 6 (six) hours as needed for mild pain or headache.      aspirin 81 MG tablet Take 81 mg by mouth daily.     atorvastatin (LIPITOR) 40 MG tablet TAKE 1 TABLET BY MOUTH EVERY EVENING 90 tablet 01   cetirizine (ZYRTEC) 10 MG tablet Take 10 mg by mouth daily as needed for allergies.     cholecalciferol (VITAMIN D3) 25 MCG (1000 UNIT) tablet      famotidine (PEPCID) 40 MG tablet Take 1 tablet (40 mg total) by mouth daily. 90 tablet 1   lisinopril (ZESTRIL) 10 MG tablet TAKE 1 TABLET BY MOUTH EVERY DAY 90 tablet 1   metoprolol tartrate (LOPRESSOR) 25 MG tablet Take 0.5 tablets (12.5 mg total) by mouth 2 (two) times daily. 90 tablet 3   nitroGLYCERIN (NITROSTAT) 0.4 MG SL tablet ONE TABLET UNDER THE TONGUE EVERY 5 MINUTES AS NEEDED FOR CHEST  PAIN. MAX 3 DOSES IN 15 MIN 75 tablet 1   Zinc 50 MG TABS      No current facility-administered medications for this visit.    Past Medical History:  Diagnosis Date   Adenomatous polyps    Hypertension    Impaired fasting glucose    Seasonal allergies     Past Surgical History:  Procedure Laterality Date   COLONOSCOPY  10/03/05   inflamed hyperplastic polyp/adenomatous poylp, left sided diverticula   COLONOSCOPY  03/03/2012   Procedure: COLONOSCOPY;  Surgeon: Daneil Dolin, MD;  [facility];  Service: Endoscopy;  Laterality: N/A;  10:15   COLONOSCOPY N/A 04/11/2017   Procedure: COLONOSCOPY;  Surgeon: Daneil Dolin, MD;  Location[facility];  Service: Endoscopy;  Laterality: N/A;  8:30 AM - moved to 5/24 @ 1:15   CORONARY ANGIOPLASTY WITH STENT PLACEMENT  07/16/2014   "1"   KNEE ARTHROSCOPY Right ~ 1994   KNEE ARTHROSCOPY Left ~ 2001   LEFT AND RIGHT HEART CATHETERIZATION WITH CORONARY ANGIOGRAM N/A 07/16/2014   Procedure: LEFT AND RIGHT HEART CATHETERIZATION WITH CORONARY ANGIOGRAM;  Surgeon: [name], MD;  Location[facility];  Service: Cardiovascular;  Laterality: N/A;   POLYPECTOMY  04/11/2017   Procedure: POLYPECTOMY;  Surgeon: Daneil Dolin, MD;  Location: [facility];  Service: Endoscopy;;  colon    Social History   Socioeconomic History   Marital status: Married    Spouse name: Not on file   Number of children: Not on file   Years of education: Not on file   Highest education level: Not on file  Occupational History   Not on file  Tobacco Use   Smoking status: Never   Smokeless tobacco: Never  Vaping Use   Vaping Use: Never used  Substance and Sexual Activity   Alcohol use: Yes    Alcohol/week: 2.0 standard drinks    Types: 2 Glasses of wine per week   Drug use: No   Sexual activity: Yes  Other Topics Concern   Not on file  Social History Narrative   Not on file   Social Determinants of Health   Financial Resource  Strain: Not on file  Food Insecurity: Not on file  Transportation Needs: Not on file  Physical Activity: Not on file  Stress: Not on file  Social Connections: Not on file  Intimate Partner Violence: Not on file      Vitals:   01/04/22 1059  BP: 116/66  Pulse: 72  SpO2: 98%  Weight: 201 lb (91.2 kg)  Height: 6' (1.829 m)    Wt Readings from Last 3 Encounters:  01/04/22 201 lb (91.2 kg)  01/04/22 197 lb 12.8 oz (89.7 kg)  10/23/21 199 lb 9.6 oz (90.5 kg)     PHYSICAL EXAM  Affect appropriate Healthy:  appears stated age HEENT: normal Neck supple with no adenopathy JVP normal no bruits no thyromegaly Lungs clear with no wheezing and good diaphragmatic motion Heart:  S1/S2 no murmur, no rub, gallop or click PMI normal Abdomen: benighn, BS positve, no tenderness, no AAA no bruit.  No HSM or HJR Distal pulses intact with no bruits No edema Neuro non-focal Skin warm and dry No muscular weakness    Labs: Lab Results  Component Value Date/Time   K 4.2 10/23/2021 09:30 AM   BUN 23 10/23/2021 09:30 AM   CREATININE 1.40 (H) 10/23/2021 09:30 AM   CREATININE 1.30 10/19/2014 07:43 AM   ALT 38 10/23/2021 09:30 AM   HGB 14.9 12/29/2021 08:00 AM     Lipids: Lab Results  Component Value Date/Time   LDLCALC 69 10/23/2021 09:30 AM   CHOL 162 10/23/2021 09:30 AM   TRIG 125 10/23/2021 09:30 AM   HDL 71 10/23/2021 09:30 AM       ASSESSMENT AND PLAN:  1. CAD s/p LAD DES: 2015 Stable ischemic heart disease. Continue aspirin, metoprolol, and statin therapy.     2.  Chronic hypertension: continue current meds    3. Hyperlipidemia:  Lipid panel from 10/23/21 reviewed above.  LDL at goal, 69.  Continue statin therapy.  4. CRF:  Bi yearly Cr with Dr Wolfgang Phoenix avoid diuretics ACE ok for now Cr 1.4   5. Sciatica:  right leg MRI not surgical may need IR injection Gave him Dr Windy Carina name should he need surgery in future    Disposition: Follow up 1 year      Jenkins Rouge  MD [facility]

## 2021-12-29 DIAGNOSIS — Z125 Encounter for screening for malignant neoplasm of prostate: Secondary | ICD-10-CM | POA: Diagnosis not present

## 2021-12-29 DIAGNOSIS — I10 Essential (primary) hypertension: Secondary | ICD-10-CM | POA: Diagnosis not present

## 2021-12-30 LAB — CBC WITH DIFFERENTIAL/PLATELET
Basophils Absolute: 0.1 10*3/uL (ref 0.0–0.2)
Basos: 1 %
EOS (ABSOLUTE): 0.5 10*3/uL — ABNORMAL HIGH (ref 0.0–0.4)
Eos: 7 %
Hematocrit: 42.9 % (ref 37.5–51.0)
Hemoglobin: 14.9 g/dL (ref 13.0–17.7)
Immature Grans (Abs): 0 10*3/uL (ref 0.0–0.1)
Immature Granulocytes: 0 %
Lymphocytes Absolute: 1.6 10*3/uL (ref 0.7–3.1)
Lymphs: 23 %
MCH: 30.8 pg (ref 26.6–33.0)
MCHC: 34.7 g/dL (ref 31.5–35.7)
MCV: 89 fL (ref 79–97)
Monocytes Absolute: 0.7 10*3/uL (ref 0.1–0.9)
Monocytes: 11 %
Neutrophils Absolute: 3.8 10*3/uL (ref 1.4–7.0)
Neutrophils: 58 %
Platelets: 199 10*3/uL (ref 150–450)
RBC: 4.84 x10E6/uL (ref 4.14–5.80)
RDW: 13.1 % (ref 11.6–15.4)
WBC: 6.7 10*3/uL (ref 3.4–10.8)

## 2021-12-30 LAB — PSA: Prostate Specific Ag, Serum: 0.5 ng/mL (ref 0.0–4.0)

## 2022-01-04 ENCOUNTER — Other Ambulatory Visit: Payer: Self-pay

## 2022-01-04 ENCOUNTER — Ambulatory Visit (INDEPENDENT_AMBULATORY_CARE_PROVIDER_SITE_OTHER): Payer: HMO | Admitting: Cardiovascular Disease

## 2022-01-04 ENCOUNTER — Encounter: Payer: Self-pay | Admitting: Cardiovascular Disease

## 2022-01-04 ENCOUNTER — Ambulatory Visit (INDEPENDENT_AMBULATORY_CARE_PROVIDER_SITE_OTHER): Payer: HMO | Admitting: Family Medicine

## 2022-01-04 ENCOUNTER — Encounter: Payer: Self-pay | Admitting: Family Medicine

## 2022-01-04 VITALS — BP 136/72 | HR 94 | Temp 97.6°F | Ht 70.0 in | Wt 197.8 lb

## 2022-01-04 VITALS — BP 116/66 | HR 72 | Ht 72.0 in | Wt 201.0 lb

## 2022-01-04 DIAGNOSIS — I10 Essential (primary) hypertension: Secondary | ICD-10-CM | POA: Diagnosis not present

## 2022-01-04 DIAGNOSIS — Z0000 Encounter for general adult medical examination without abnormal findings: Secondary | ICD-10-CM | POA: Diagnosis not present

## 2022-01-04 DIAGNOSIS — E785 Hyperlipidemia, unspecified: Secondary | ICD-10-CM | POA: Diagnosis not present

## 2022-01-04 DIAGNOSIS — I2510 Atherosclerotic heart disease of native coronary artery without angina pectoris: Secondary | ICD-10-CM

## 2022-01-04 MED ORDER — LISINOPRIL 10 MG PO TABS: ORAL_TABLET | ORAL | 1 refills | Status: DC

## 2022-01-04 MED ORDER — FAMOTIDINE 40 MG PO TABS: 40.0000 mg | ORAL_TABLET | Freq: Every day | ORAL | 1 refills | Status: DC

## 2022-01-04 MED ORDER — ATORVASTATIN CALCIUM 40 MG PO TABS: ORAL_TABLET | ORAL | Status: DC

## 2022-01-04 NOTE — Patient Instructions (Signed)
Medication Instructions:  Your physician recommends that you continue on your current medications as directed. Please refer to the Current Medication list given to you today.  *If you need a refill on your cardiac medications before your next appointment, please call your pharmacy*   Lab Work: NONE   If you have labs (blood work) drawn today and your tests are completely normal, you will receive your results only by: . MyChart Message (if you have MyChart) OR . A paper copy in the mail If you have any lab test that is abnormal or we need to change your treatment, we will call you to review the results.   Testing/Procedures: NONE    Follow-Up: At [facility], you and your health needs are our priority.  As part of our continuing mission to provide you with exceptional heart care, we have created designated Provider Care Teams.  These Care Teams include your primary Cardiologist (physician) and Advanced Practice Providers (APPs -  Physician Assistants and Nurse Practitioners) who all work together to provide you with the care you need, when you need it.  We recommend signing up for the patient portal called "MyChart".  Sign up information is provided on this After Visit Summary.  MyChart is used to connect with patients for Virtual Visits (Telemedicine).  Patients are able to view lab/test results, encounter notes, upcoming appointments, etc.  Non-urgent messages can be sent to your provider as well.   To learn more about what you can do with MyChart, go to [email or web address].    Your next appointment:   1 year(s)  The format for your next appointment:   In Person  Provider:   Peter Nishan, MD   Other Instructions Thank you for choosing [facility]!    

## 2022-01-04 NOTE — Patient Instructions (Signed)
You're doing well.  Continue your medications.  Consider the Shingles vaccine and Tdap (get at pharmacy).  Up to you regarding COVID booster.  Follow up in 6 months.  Take care  Dr. Lacinda Axon

## 2022-01-04 NOTE — Progress Notes (Signed)
Subjective:   Roger [name] is a 76 y.o. male who presents for Medicare Annual/Subsequent preventive examination/physical.  Patient states that he is doing well.  He has been having some trouble with his sciatic nerve.  He has been seeing orthopedics.  Also having some knee trouble.   Review of Systems    See above       Objective:    Today's Vitals   01/04/22 0901  BP: 136/72  Pulse: 94  Temp: 97.6 F (36.4 C)  SpO2: 98%  Weight: 197 lb 12.8 oz (89.7 kg)  Height: 5\' 10"  (1.778 m)   Body mass index is 28.38 kg/m. Physical Exam   Gen.: Well-appearing, no acute distress. HENT: Normocephalic atraumatic. Normal TMs bilaterally. Oropharynx clear.  Eyes: No scleral icterus. Normal conjunctiva. Neck: Supple. No thyromegaly or adenopathy. Heart: Regular rate and rhythm. No murmurs appreciated. No lower extremity edema. Lungs: Clear auscultation bilaterally. No rales, rhonchi, or wheezing. Abdomen: Soft, nontender, nondistended. No palpable organomegaly. No rebound or guarding. MSK: Normal range of motion. Neuro: No focal deficits. Psych: Normal mood and affect.   Advanced Directives 04/11/2017 08/26/2014 07/16/2014 03/03/2012  Does Patient Have a Medical Advance Directive? Yes Yes Yes Patient does not have advance directive;Patient would not like information  Type of Advance Directive Living will Roger [name];Living will Roger [name];Living will -  Does patient want to make changes to medical advance directive? - No - Patient declined - -  Copy of [place] in Chart? - No - copy requested No - copy requested -  Would patient like information on creating a medical advance directive? No - Patient declined - - -  Pre-existing out of facility DNR order (yellow form or pink MOST form) - - - No    Current Medications (verified) Outpatient Encounter Medications as of 01/04/2022  Medication Sig   acetaminophen (TYLENOL) 500 MG  tablet Take 500-1,000 mg by mouth every 6 (six) hours as needed for mild pain or headache.    aspirin 81 MG tablet Take 81 mg by mouth daily.   atorvastatin (LIPITOR) 40 MG tablet TAKE 1 TABLET BY MOUTH EVERY EVENING   cetirizine (ZYRTEC) 10 MG tablet Take 10 mg by mouth daily as needed for allergies.   cholecalciferol (VITAMIN D3) 25 MCG (1000 UNIT) tablet    famotidine (PEPCID) 40 MG tablet TAKE 1 TABLET BY MOUTH EVERY DAY   lisinopril (ZESTRIL) 10 MG tablet TAKE 1 TABLET BY MOUTH EVERY DAY   metoprolol tartrate (LOPRESSOR) 25 MG tablet Take 0.5 tablets (12.5 mg total) by mouth 2 (two) times daily.   nitroGLYCERIN (NITROSTAT) 0.4 MG SL tablet ONE TABLET UNDER THE TONGUE EVERY 5 MINUTES AS NEEDED FOR CHEST PAIN. MAX 3 DOSES IN 15 MIN   Zinc 50 MG TABS    [DISCONTINUED] gabapentin (NEURONTIN) 100 MG capsule Take 1 capsule (100 mg total) by mouth daily.   No facility-administered encounter medications on file as of 01/04/2022.    Allergies (verified) Patient has no known allergies.   History: Past Medical History:  Diagnosis Date   Adenomatous polyps    Hypertension    Impaired fasting glucose    Seasonal allergies    Past Surgical History:  Procedure Laterality Date   COLONOSCOPY  10/03/05   inflamed hyperplastic polyp/adenomatous poylp, left sided diverticula   COLONOSCOPY  03/03/2012   Procedure: COLONOSCOPY;  Surgeon: Daneil Dolin, MD;  [facility];  Service: Endoscopy;  Laterality: N/A;  10:15   COLONOSCOPY N/A 04/11/2017   Procedure: COLONOSCOPY;  Surgeon: Daneil Dolin, MD;  [facility];  Service: Endoscopy;  Laterality: N/A;  8:30 AM - moved to 5/24 @ 1:15   CORONARY ANGIOPLASTY WITH STENT PLACEMENT  07/16/2014   "1"   KNEE ARTHROSCOPY Right ~ 1994   KNEE ARTHROSCOPY Left ~ 2001   LEFT AND RIGHT HEART CATHETERIZATION WITH CORONARY ANGIOGRAM N/A 07/16/2014   Procedure: LEFT AND RIGHT HEART CATHETERIZATION WITH CORONARY ANGIOGRAM;  Surgeon: [name], MD;  Location: [facility] CATH LAB;  Service: Cardiovascular;  Laterality: N/A;   POLYPECTOMY  04/11/2017   Procedure: POLYPECTOMY;  Surgeon: Daneil Dolin, MD;  [facility];  Service: Endoscopy;;  colon   Family History  Problem Relation Age of Onset   Liver cancer Father    Cancer Father        liver   Colon cancer Neg Hx    Social History   Socioeconomic History   Marital status: Married    Spouse name: Not on file   Number of children: Not on file   Years of education: Not on file   Highest education level: Not on file  Occupational History   Not on file  Tobacco Use   Smoking status: Never   Smokeless tobacco: Never  Vaping Use   Vaping Use: Never used  Substance and Sexual Activity   Alcohol use: Yes    Alcohol/week: 2.0 standard drinks    Types: 2 Glasses of wine per week   Drug use: No   Sexual activity: Yes  Other Topics Concern   Not on file  Social History Narrative   Not on file   Social Determinants of Health   Financial Resource Strain: Not on file  Food Insecurity: Not on file  Transportation Needs: Not on file  Physical Activity: Not on file  Stress: Not on file  Social Connections: Not on file    Tobacco Counseling Not indicated; nonsmoker.  Activities of Daily Living In your present state of health, do you have any difficulty performing the following activities: 12/31/2021  Hearing? N  Vision? N  Difficulty concentrating or making decisions? N  Walking or climbing stairs? N  Dressing or bathing? N  Doing errands, shopping? N  Preparing Food and eating ? N  Using the Toilet? N  In the past six months, have you accidently leaked urine? N  Do you have problems with loss of bowel control? N  Managing your Medications? N  Managing your Finances? N  Housekeeping or managing your Housekeeping? N  Some recent data might be hidden    Patient Care Team: [name], DO as PCP - General (Family Medicine) Gala Romney [name],  MD (Gastroenterology) Assessment:   This is a routine wellness examination for Roger [name].  Hearing/Vision screen Wears hearing aids No issues with his vision.  Wears glasses.   Dietary issues and exercise activities discussed: Advised to stay active.    Depression Screen PHQ 2/9 Scores 01/04/2022 10/23/2021 01/03/2021 08/30/2020 12/29/2018 12/29/2018 12/27/2017  PHQ - 2 Score 0 0 0 0 0 0 0    Fall Risk Fall Risk  01/04/2022 12/31/2021 10/23/2021 01/03/2021 01/06/2020  Falls in the past year? 0 0 0 0 0  Comment - - - - -  Number falls in past yr: 0 0 - - 0  Injury with Fall? 0 0 - - 0  Risk for fall due to : No Fall Risks -  No Fall Risks - -  Follow up Falls evaluation completed - Falls evaluation completed Falls evaluation completed -    FALL RISK PREVENTION PERTAINING TO THE HOME: Patient is not a fall risk.  ASSISTIVE DEVICES UTILIZED TO PREVENT FALLS: Patient does not need any assistive devices.  Cognitive Function: Normal. Passed Minicog.   Immunizations Immunization History  Administered Date(s) Administered   Fluad Quad(high Dose 65+) 08/30/2020   Influenza, High Dose Seasonal PF 09/02/2018, 09/25/2021   Influenza,inj,Quad PF,6+ Mos 08/25/2015   Influenza-Unspecified 08/21/2013, 09/07/2014, 08/15/2016, 08/01/2017, 09/02/2018, 08/20/2019, 12/06/2019   Moderna SARS-COV2 Booster Vaccination 09/15/2020   Moderna Sars-Covid-2 Vaccination 12/10/2019, 01/11/2020   Pneumococcal Conjugate-13 10/22/2014   Pneumococcal-Unspecified 10/14/2012   Zoster, Live 11/20/2011    TDAP status: Due, Education has been provided regarding the importance of this vaccine. Advised may receive this vaccine at local pharmacy or Health Dept. Aware to provide a copy of the vaccination record if obtained from local pharmacy or Health Dept. Verbalized acceptance and understanding.  Flu Vaccine status: Up to date  Pneumococcal vaccine status: Up to date  Covid-19 vaccine status: Information provided on how  to obtain vaccines.   Qualifies for Shingles Vaccine? Yes    Screening Tests Health Maintenance  Topic Date Due   TETANUS/TDAP  Never done   Zoster Vaccines- Shingrix (1 of 2) Never done   Pneumonia Vaccine 106+ Years old (2 - PPSV23 if available, else PCV20) 10/23/2015   COVID-19 Vaccine (3 - Booster for Moderna series) 11/10/2020   COLONOSCOPY (Pts 45-87yrs Insurance coverage will need to be confirmed)  04/11/2022   INFLUENZA VACCINE  Completed   Hepatitis C Screening  Completed   HPV VACCINES  Aged Out    Health Maintenance  Health Maintenance Due  Topic Date Due   TETANUS/TDAP  Never done   Zoster Vaccines- Shingrix (1 of 2) Never done   Pneumonia Vaccine 81+ Years old (2 - PPSV23 if available, else PCV20) 10/23/2015   COVID-19 Vaccine (3 - Booster for Moderna series) 11/10/2020    Colorectal cancer screening: Up to date.  Additional Screening:  Hepatitis C Screening: Completed.  Vision Screening: Recommended annual ophthalmology exams for early detection of glaucoma and other disorders of the eye. Is the patient up to date with their annual eye exam?  Yes     Plan:     I have personally reviewed and noted the following in the patients chart:   Medical and social history Use of alcohol, tobacco or illicit drugs  Current medications and supplements  Functional ability and status Nutritional status Physical activity Advanced directives List of other physicians Hospitalizations, surgeries, and ER visits in previous 12 months Vitals Screenings to include cognitive, depression, and falls Referrals and appointments  In addition, I have reviewed and discussed with patient certain preventive protocols, quality metrics, and best practice recommendations. general preventive health recommendations were provided to patient.    [name], DO   01/04/2022

## 2022-02-19 ENCOUNTER — Other Ambulatory Visit: Payer: Self-pay | Admitting: Cardiovascular Disease

## 2022-03-06 ENCOUNTER — Encounter: Payer: Self-pay | Admitting: *Deleted

## 2022-04-12 DIAGNOSIS — L570 Actinic keratosis: Secondary | ICD-10-CM | POA: Diagnosis not present

## 2022-04-12 DIAGNOSIS — B360 Pityriasis versicolor: Secondary | ICD-10-CM | POA: Diagnosis not present

## 2022-04-12 DIAGNOSIS — X32XXXD Exposure to sunlight, subsequent encounter: Secondary | ICD-10-CM | POA: Diagnosis not present

## 2022-04-26 DIAGNOSIS — G5761 Lesion of plantar nerve, right lower limb: Secondary | ICD-10-CM | POA: Diagnosis not present

## 2022-04-26 DIAGNOSIS — M79671 Pain in right foot: Secondary | ICD-10-CM | POA: Diagnosis not present

## 2022-05-01 ENCOUNTER — Telehealth: Payer: Self-pay | Admitting: *Deleted

## 2022-05-01 NOTE — Telephone Encounter (Signed)
Referring MD/PCP: Thersa Salt  Procedure: Colonoscopy  Has patient had this procedure before?  Dr. Gala Romney, 04/11/17  If so, when, by whom and where?    Is there a family history of colon cancer?  no  Who?  What age when diagnosed?    Is patient diabetic? If yes, Type 1 or Type 2   no      Does patient have prosthetic heart valve or mechanical valve?  no  Do you have a pacemaker/defibrillator?  no  Has patient ever had endocarditis/atrial fibrillation? no  Does patient use oxygen? no  Has patient had joint replacement within last 12 months?  no  Is patient constipated or do they take laxatives? no  Does patient have a history of alcohol/drug use?  no  Have you had a stroke/heart attack last 6 mths? no  Do you take medicine for weight loss?  no   Is patient on blood thinner such as Coumadin, Plavix and/or Aspirin? Yes, aspirin 81  Medications:  Current Outpatient Medications on File Prior to Visit  Medication Sig Dispense Refill   acetaminophen (TYLENOL) 500 MG tablet Take 500-1,000 mg by mouth every 6 (six) hours as needed for mild pain or headache.      aspirin 81 MG tablet Take 81 mg by mouth daily.     atorvastatin (LIPITOR) 40 MG tablet TAKE 1 TABLET BY MOUTH EVERY EVENING 90 tablet 01   cetirizine (ZYRTEC) 10 MG tablet Take 10 mg by mouth daily as needed for allergies.     cholecalciferol (VITAMIN D3) 25 MCG (1000 UNIT) tablet      famotidine (PEPCID) 40 MG tablet Take 1 tablet (40 mg total) by mouth daily. 90 tablet 1   lisinopril (ZESTRIL) 10 MG tablet TAKE 1 TABLET BY MOUTH EVERY DAY 90 tablet 1   metoprolol tartrate (LOPRESSOR) 25 MG tablet TAKE HALF A TABLET BY MOUTH TWICE A DAY 90 tablet 3   nitroGLYCERIN (NITROSTAT) 0.4 MG SL tablet ONE TABLET UNDER THE TONGUE EVERY 5 MINUTES AS NEEDED FOR CHEST PAIN. MAX 3 DOSES IN 15 MIN 75 tablet 1   Zinc 50 MG TABS      No current facility-administered medications on file prior to visit.     Allergies: No Known Allergies

## 2022-05-23 NOTE — Telephone Encounter (Signed)
Age 76. Needs triage due to age.

## 2022-05-23 NOTE — Telephone Encounter (Signed)
Per Vicente Males, needs OV or virtual visit with [facility] due to age. Please schedule thanks

## 2022-06-06 DIAGNOSIS — N1832 Chronic kidney disease, stage 3b: Secondary | ICD-10-CM | POA: Diagnosis not present

## 2022-06-19 ENCOUNTER — Encounter: Payer: Self-pay | Admitting: Gastroenterology

## 2022-06-19 ENCOUNTER — Telehealth (INDEPENDENT_AMBULATORY_CARE_PROVIDER_SITE_OTHER): Payer: PPO | Admitting: Gastroenterology

## 2022-06-19 ENCOUNTER — Telehealth: Payer: Self-pay | Admitting: *Deleted

## 2022-06-19 VITALS — Ht 72.0 in | Wt 190.0 lb

## 2022-06-19 DIAGNOSIS — Z86010 Personal history of colonic polyps: Secondary | ICD-10-CM

## 2022-06-19 NOTE — Patient Instructions (Signed)
We are arranging a colonoscopy with Dr. Gala Romney in the near future.  Further recommendations to follow!  It was a pleasure to see you today. I want to create trusting relationships with patients to provide genuine, compassionate, and quality care. I value your feedback. If you receive a survey regarding your visit,  I greatly appreciate you taking time to fill this out.   Annitta Needs, PhD, ANP-BC [facility]

## 2022-06-19 NOTE — Progress Notes (Signed)
Primary Care Physician:  [name], DO  Primary GI: Dr. Gala Romney   Patient Location: Home   Provider Location: [facility] office   Reason for Visit: Arrange colonoscopy   Persons present on the virtual encounter, with roles:    Total time (minutes) spent on medical discussion: 8 minutes   Due to COVID-19, visit was conducted using virtual method.  Visit was requested by patient.  Virtual Visit via MyChart Video Note Due to COVID-19, visit is conducted virtually and was requested by patient.   I connected with Roger [name] on 06/19/22 at  9:00 AM EDT by video and verified that I am speaking with the correct person using two identifiers.   I discussed the limitations, risks, security and privacy concerns of performing an evaluation and management service by video and the availability of in person appointments. I also discussed with the patient that there may be a patient responsible charge related to this service. The patient expressed understanding and agreed to proceed.  Chief Complaint  Patient presents with   Colonoscopy    Triage for colonoscopy     History of Present Illness: Roger [name]  is a 76 year old male presenting today virtually due to age to discuss surveillance colonoscopy. He has a history of adenomas, with last colonoscopy in 2018 (sessile serrated adenoma).    No abdominal pain, N/V, changes in bowel habits, constipation, diarrhea, overt GI bleeding, GERD, dysphagia, unexplained weight loss, lack of appetite, unexplained weight gain. He has no concerns today. No new health issues.   Past Medical History:  Diagnosis Date   Adenomatous polyps    CAD (coronary artery disease)    Hypertension    Impaired fasting glucose    Seasonal allergies      Past Surgical History:  Procedure Laterality Date   COLONOSCOPY  10/03/2005   inflamed hyperplastic polyp/adenomatous poylp, left sided diverticula   COLONOSCOPY  03/03/2012   Procedure:  COLONOSCOPY;  Surgeon: Daneil Dolin, MD;  [facility];  Service: Endoscopy;  Laterality: N/A;  10:15   COLONOSCOPY N/A 04/11/2017   one 5 mm polyp hepatic flexure. Sigmoid diverticulosis. Sessile serrated adenoma.   CORONARY ANGIOPLASTY WITH STENT PLACEMENT  07/16/2014   "1"   KNEE ARTHROSCOPY Right ~ 1994   KNEE ARTHROSCOPY Left ~ 2001   LEFT AND RIGHT HEART CATHETERIZATION WITH CORONARY ANGIOGRAM N/A 07/16/2014   Procedure: LEFT AND RIGHT HEART CATHETERIZATION WITH CORONARY ANGIOGRAM;  Surgeon: [name], MD;  Location: [facility] CATH LAB;  Service: Cardiovascular;  Laterality: N/A;   POLYPECTOMY  04/11/2017   Procedure: POLYPECTOMY;  Surgeon: Daneil Dolin, MD;  [facility];  Service: Endoscopy;;  colon     Current Meds  Medication Sig   acetaminophen (TYLENOL) 500 MG tablet Take 500-1,000 mg by mouth every 6 (six) hours as needed for mild pain or headache.    aspirin 81 MG tablet Take 81 mg by mouth daily.   atorvastatin (LIPITOR) 40 MG tablet TAKE 1 TABLET BY MOUTH EVERY EVENING   cetirizine (ZYRTEC) 10 MG tablet Take 10 mg by mouth daily as needed for allergies.   cholecalciferol (VITAMIN D3) 25 MCG (1000 UNIT) tablet    famotidine (PEPCID) 40 MG tablet Take 1 tablet (40 mg total) by mouth daily.   lisinopril (ZESTRIL) 10 MG tablet TAKE 1 TABLET BY MOUTH EVERY DAY   metoprolol tartrate (LOPRESSOR) 25 MG tablet TAKE HALF A TABLET BY MOUTH TWICE A  DAY   nitroGLYCERIN (NITROSTAT) 0.4 MG SL tablet ONE TABLET UNDER THE TONGUE EVERY 5 MINUTES AS NEEDED FOR CHEST PAIN. MAX 3 DOSES IN 15 MIN   Zinc 50 MG TABS      Family History  Problem Relation Age of Onset   Liver cancer Father    Cancer Father        liver   Colon cancer Neg Hx     Social History   Socioeconomic History   Marital status: Married    Spouse name: Not on file   Number of children: Not on file   Years of education: Not on file   Highest education level: Not on file   Occupational History   Not on file  Tobacco Use   Smoking status: Never   Smokeless tobacco: Never  Vaping Use   Vaping Use: Never used  Substance and Sexual Activity   Alcohol use: Yes    Alcohol/week: 2.0 standard drinks of alcohol    Types: 2 Glasses of wine per week   Drug use: No   Sexual activity: Yes  Other Topics Concern   Not on file  Social History Narrative   Not on file   Social Determinants of Health   Financial Resource Strain: Not on file  Food Insecurity: Not on file  Transportation Needs: Not on file  Physical Activity: Not on file  Stress: Not on file  Social Connections: Not on file       Review of Systems: Gen: Denies fever, chills, anorexia. Denies fatigue, weakness, weight loss.  CV: Denies chest pain, palpitations, syncope, peripheral edema, and claudication. Resp: Denies dyspnea at rest, cough, wheezing, coughing up blood, and pleurisy. GI: see HPI Derm: Denies rash, itching, dry skin Psych: Denies depression, anxiety, memory loss, confusion. No homicidal or suicidal ideation.  Heme: Denies bruising, bleeding, and enlarged lymph nodes.  Observations/Objective: No distress. Unable to perform physical exam due to video encounter.   Assessment and Plan: 76 year old male presenting today virtually due to age to discuss surveillance colonoscopy with history of adenomas.  Last colonoscopy in 2018 with sessile serrated adenoma. He has no concerning lower or upper GI signs/symptoms or complaints. He is in good health and desires to pursue colonoscopy in the near future.   Proceed with colonoscopy by Dr. Gala Romney in near future: the risks, benefits, and alternatives have been discussed with the patient in detail. The patient states understanding and desires to proceed.   Follow Up Instructions:    I discussed the assessment and treatment plan with the patient. The patient was provided an opportunity to ask questions and all were answered. The patient  agreed with the plan and demonstrated an understanding of the instructions.   The patient was advised to call back or seek an in-person evaluation if the symptoms worsen or if the condition fails to improve as anticipated.  I provided 8 minutes of face-to-face time during this MyChart Video encounter.  Annitta Needs, PhD, ANP-BC [facility]

## 2022-06-19 NOTE — Telephone Encounter (Signed)
Patient wants to wait to have TCS done for sometime in September. Made aware will call once we have that schedule.

## 2022-06-25 DIAGNOSIS — E785 Hyperlipidemia, unspecified: Secondary | ICD-10-CM | POA: Diagnosis not present

## 2022-06-25 DIAGNOSIS — I129 Hypertensive chronic kidney disease with stage 1 through stage 4 chronic kidney disease, or unspecified chronic kidney disease: Secondary | ICD-10-CM | POA: Diagnosis not present

## 2022-06-25 DIAGNOSIS — N1832 Chronic kidney disease, stage 3b: Secondary | ICD-10-CM | POA: Diagnosis not present

## 2022-06-26 ENCOUNTER — Encounter: Payer: Self-pay | Admitting: *Deleted

## 2022-06-26 ENCOUNTER — Telehealth: Payer: Self-pay | Admitting: *Deleted

## 2022-06-26 MED ORDER — PEG 3350-KCL-NA BICARB-NACL 420 G PO SOLR
4000.0000 mL | Freq: Once | ORAL | 0 refills | Status: AC
Start: 2022-06-26 — End: 2022-06-26

## 2022-06-26 NOTE — Telephone Encounter (Signed)
Spoke with pt. He has been scheduled for TCS with Dr. Gala Romney asa 3 on 9/22 at 7:30am. Aware will mail instructions/pre-op appt. Rx for prep sent to pharmacy.

## 2022-06-26 NOTE — Telephone Encounter (Signed)
Called pt. He will look at spouse schedule and call back

## 2022-07-01 ENCOUNTER — Other Ambulatory Visit: Payer: Self-pay | Admitting: Family Medicine

## 2022-07-04 ENCOUNTER — Ambulatory Visit (INDEPENDENT_AMBULATORY_CARE_PROVIDER_SITE_OTHER): Payer: HMO | Admitting: Family Medicine

## 2022-07-04 ENCOUNTER — Other Ambulatory Visit: Payer: Self-pay | Admitting: Family Medicine

## 2022-07-04 VITALS — BP 126/68 | HR 54 | Temp 98.1°F | Ht 72.0 in | Wt 200.0 lb

## 2022-07-04 DIAGNOSIS — B360 Pityriasis versicolor: Secondary | ICD-10-CM

## 2022-07-04 DIAGNOSIS — N183 Chronic kidney disease, stage 3 unspecified: Secondary | ICD-10-CM

## 2022-07-04 DIAGNOSIS — E785 Hyperlipidemia, unspecified: Secondary | ICD-10-CM | POA: Diagnosis not present

## 2022-07-04 DIAGNOSIS — I10 Essential (primary) hypertension: Secondary | ICD-10-CM

## 2022-07-04 DIAGNOSIS — I2510 Atherosclerotic heart disease of native coronary artery without angina pectoris: Secondary | ICD-10-CM

## 2022-07-04 MED ORDER — FLUCONAZOLE 150 MG PO TABS: ORAL_TABLET | ORAL | 0 refills | Status: DC

## 2022-07-04 NOTE — Assessment & Plan Note (Signed)
Stable.  No current symptoms.  Continue current medications.

## 2022-07-04 NOTE — Assessment & Plan Note (Signed)
Has been at goal.  Continue Lipitor.  Lipid panel today.

## 2022-07-04 NOTE — Assessment & Plan Note (Signed)
Persistent.  Treating with Diflucan.

## 2022-07-04 NOTE — Assessment & Plan Note (Signed)
At goal.  Continue lisinopril and metoprolol.

## 2022-07-04 NOTE — Assessment & Plan Note (Signed)
Labs today.  Has been stable.

## 2022-07-04 NOTE — Patient Instructions (Signed)
Medication as prescribed.  Labs today.  Follow up in 6 months.  Take care  Dr. Gleen Ripberger  

## 2022-07-04 NOTE — Progress Notes (Signed)
Subjective:  Patient ID: Roger [name], male    DOB: January 21, 1946  Age: 76 y.o. MRN: [id number]  CC: Follow-up  HPI:  76 year old male with coronary artery disease, hypertension, stage III CKD, hyperlipidemia presents for follow-up.  Patient's hypertension is well controlled on lisinopril and metoprolol.   CAD is stable.  No chest pain or shortness of breath.  He is compliant with aspirin, atorvastatin, metoprolol, and lisinopril.  Hyperlipidemia has been at goal on Lipitor.  He tolerates medication without difficulty.  Patient states that overall he is doing well.  His only significant complaint is tinea versicolor which has not responded to treatment.  Is located primarily on his anterior chest.  Patient Active Problem List   Diagnosis Date Noted   Tinea versicolor 07/04/2022   Tinnitus of both ears 10/06/2020   Stage 3 chronic kidney disease ([place]) 03/03/2020   Essential hypertension 07/17/2014   Hyperlipidemia 07/17/2014   CAD- s/p PCI + DES to mid LAD 07/16/14 07/17/2014   Adenomatous polyps 01/30/2012    Social Hx   Social History   Socioeconomic History   Marital status: Married    Spouse name: Not on file   Number of children: Not on file   Years of education: Not on file   Highest education level: Not on file  Occupational History   Not on file  Tobacco Use   Smoking status: Never   Smokeless tobacco: Never  Vaping Use   Vaping Use: Never used  Substance and Sexual Activity   Alcohol use: Yes    Alcohol/week: 2.0 standard drinks of alcohol    Types: 2 Glasses of wine per week   Drug use: No   Sexual activity: Yes  Other Topics Concern   Not on file  Social History Narrative   Not on file   Social Determinants of Health   Financial Resource Strain: Not on file  Food Insecurity: Not on file  Transportation Needs: Not on file  Physical Activity: Not on file  Stress: Not on file  Social Connections: Not on file    Review of Systems Per  HPI  Objective:  BP 126/68   Pulse (!) 54   Temp 98.1 F (36.7 C)   Ht 6' (1.829 m)   Wt 200 lb (90.7 kg)   SpO2 98%   BMI 27.12 kg/m      07/04/2022    9:32 AM 06/19/2022    8:42 AM 01/04/2022   10:59 AM  BP/Weight  Systolic BP 151  761  Diastolic BP 68  66  Wt. (Lbs) 200 190 201  BMI 27.12 kg/m2 25.77 kg/m2 27.26 kg/m2    Physical Exam Constitutional:      Appearance: Normal appearance.  HENT:     Head: Normocephalic and atraumatic.  Eyes:     General:        Right eye: No discharge.        Left eye: No discharge.     Conjunctiva/sclera: Conjunctivae normal.  Cardiovascular:     Rate and Rhythm: Regular rhythm. Bradycardia present.  Pulmonary:     Effort: Pulmonary effort is normal.     Breath sounds: Normal breath sounds. No wheezing, rhonchi or rales.  Skin:    Comments: Chest/trunk with hyperpigmented macules and patches.  Neurological:     Mental Status: He is alert.  Psychiatric:        Mood and Affect: Mood normal.        Behavior: Behavior normal.  Lab Results  Component Value Date   WBC 6.7 12/29/2021   HGB 14.9 12/29/2021   HCT 42.9 12/29/2021   PLT 199 12/29/2021   GLUCOSE 84 10/23/2021   CHOL 162 10/23/2021   TRIG 125 10/23/2021   HDL 71 10/23/2021   LDLCALC 69 10/23/2021   ALT 38 10/23/2021   AST 23 10/23/2021   NA 141 10/23/2021   K 4.2 10/23/2021   CL 104 10/23/2021   CREATININE 1.40 (H) 10/23/2021   BUN 23 10/23/2021   CO2 25 10/23/2021   PSA 0.39 10/19/2014   INR 0.98 07/14/2014     Assessment & Plan:   Problem List Items Addressed This Visit       Cardiovascular and Mediastinum   CAD- s/p PCI + DES to mid LAD 07/16/14 - Primary (Chronic)    Stable.  No current symptoms.  Continue current medications.      Essential hypertension    At goal.  Continue lisinopril and metoprolol.        Musculoskeletal and Integument   Tinea versicolor    Persistent.  Treating with Diflucan.      Relevant Medications    ketoconazole (NIZORAL) 2 % cream   fluconazole (DIFLUCAN) 150 MG tablet     Genitourinary   Stage 3 chronic kidney disease ([place])    Labs today.  Has been stable.      Relevant Orders   CBC   CMP14+EGFR     Other   Hyperlipidemia    Has been at goal.  Continue Lipitor.  Lipid panel today.      Relevant Orders   Lipid panel    Meds ordered this encounter  Medications   fluconazole (DIFLUCAN) 150 MG tablet    Sig: 2 tablets once weekly for 2 weeks.    Dispense:  4 tablet    Refill:  0    Follow-up:  6 months  [place] DO [place]

## 2022-07-09 DIAGNOSIS — E785 Hyperlipidemia, unspecified: Secondary | ICD-10-CM | POA: Diagnosis not present

## 2022-07-09 DIAGNOSIS — N183 Chronic kidney disease, stage 3 unspecified: Secondary | ICD-10-CM | POA: Diagnosis not present

## 2022-07-10 LAB — CBC
Hematocrit: 43.9 % (ref 37.5–51.0)
Hemoglobin: 14.7 g/dL (ref 13.0–17.7)
MCH: 29.9 pg (ref 26.6–33.0)
MCHC: 33.5 g/dL (ref 31.5–35.7)
MCV: 89 fL (ref 79–97)
Platelets: 177 10*3/uL (ref 150–450)
RBC: 4.91 x10E6/uL (ref 4.14–5.80)
RDW: 13.2 % (ref 11.6–15.4)
WBC: 6.7 10*3/uL (ref 3.4–10.8)

## 2022-07-10 LAB — CMP14+EGFR
ALT: 25 IU/L (ref 0–44)
AST: 21 IU/L (ref 0–40)
Albumin/Globulin Ratio: 2.2 (ref 1.2–2.2)
Albumin: 4.6 g/dL (ref 3.8–4.8)
Alkaline Phosphatase: 104 IU/L (ref 44–121)
BUN/Creatinine Ratio: 15 (ref 10–24)
BUN: 19 mg/dL (ref 8–27)
Bilirubin Total: 0.4 mg/dL (ref 0.0–1.2)
CO2: 23 mmol/L (ref 20–29)
Calcium: 9.3 mg/dL (ref 8.6–10.2)
Chloride: 104 mmol/L (ref 96–106)
Creatinine, Ser: 1.3 mg/dL — ABNORMAL HIGH (ref 0.76–1.27)
Globulin, Total: 2.1 g/dL (ref 1.5–4.5)
Glucose: 112 mg/dL — ABNORMAL HIGH (ref 70–99)
Potassium: 4.5 mmol/L (ref 3.5–5.2)
Sodium: 143 mmol/L (ref 134–144)
Total Protein: 6.7 g/dL (ref 6.0–8.5)
eGFR: 57 mL/min/{1.73_m2} — ABNORMAL LOW (ref >59)

## 2022-07-10 LAB — LIPID PANEL
Chol/HDL Ratio: 2.6 ratio (ref 0.0–5.0)
Cholesterol, Total: 129 mg/dL (ref 100–199)
HDL: 49 mg/dL (ref >39)
LDL Chol Calc (NIH): 66 mg/dL (ref 0–99)
Triglycerides: 65 mg/dL (ref 0–149)
VLDL Cholesterol Cal: 14 mg/dL (ref 5–40)

## 2022-07-16 ENCOUNTER — Other Ambulatory Visit: Payer: Self-pay | Admitting: Family Medicine

## 2022-08-07 NOTE — Patient Instructions (Signed)
Roger [name]  08/07/2022     '@PREFPERIOPPHARMACY'$ @   Your procedure is scheduled on  08/10/2022.   Report to [facility] at  0600 A.M.   Call this number if you have problems the morning of surgery:  [phone]   Remember:  Follow the diet and prep instructions given to you by the office.     Take these medicines the morning of surgery with A SIP OF WATER                                       zyrtec, pepcid, metoprolol.     Do not wear jewelry, make-up or nail polish.  Do not wear lotions, powders, or perfumes, or deodorant.  Do not shave 48 hours prior to surgery.  Men may shave face and neck.  Do not bring valuables to the hospital.  [facility] is not responsible for any belongings or valuables.  Contacts, dentures or bridgework may not be worn into surgery.  Leave your suitcase in the car.  After surgery it may be brought to your room.  For patients admitted to the hospital, discharge time will be determined by your treatment team.  Patients discharged the day of surgery will not be allowed to drive home and must have someone with them for 24 hours.    Special instructions:   DO NOT smoke tobacco or vape for 24 hours before your  procedure.  Please read over the following fact sheets that you were given. Anesthesia Post-op Instructions and Care and Recovery After Surgery      Colonoscopy, Adult, Care After The following information offers guidance on how to care for yourself after your procedure. Your health care provider may also give you more specific instructions. If you have problems or questions, contact your health care provider. What can I expect after the procedure? After the procedure, it is common to have: A small amount of blood in your stool for 24 hours after the procedure. Some gas. Mild cramping or bloating of your abdomen. Follow these instructions at home: Eating and drinking  Drink enough fluid to keep your urine pale  yellow. Follow instructions from your health care provider about eating or drinking restrictions. Resume your normal diet as told by your health care provider. Avoid heavy or fried foods that are hard to digest. Activity Rest as told by your health care provider. Avoid sitting for a long time without moving. Get up to take short walks every 1-2 hours. This is important to improve blood flow and breathing. Ask for help if you feel weak or unsteady. Return to your normal activities as told by your health care provider. Ask your health care provider what activities are safe for you. Managing cramping and bloating  Try walking around when you have cramps or feel bloated. If directed, apply heat to your abdomen as told by your health care provider. Use the heat source that your health care provider recommends, such as a moist heat pack or a heating pad. Place a towel between your skin and the heat source. Leave the heat on for 20-30 minutes. Remove the heat if your skin turns bright red. This is especially important if you are unable to feel pain, heat, or cold. You have a greater risk of getting burned. General instructions If you were given a sedative during the procedure, it can  affect you for several hours. Do not drive or operate machinery until your health care provider says that it is safe. For the first 24 hours after the procedure: Do not sign important documents. Do not drink alcohol. Do your regular daily activities at a slower pace than normal. Eat soft foods that are easy to digest. Take over-the-counter and prescription medicines only as told by your health care provider. Keep all follow-up visits. This is important. Contact a health care provider if: You have blood in your stool 2-3 days after the procedure. Get help right away if: You have more than a small spotting of blood in your stool. You have large blood clots in your stool. You have swelling of your abdomen. You have  nausea or vomiting. You have a fever. You have increasing pain in your abdomen that is not relieved with medicine. These symptoms may be an emergency. Get help right away. Call 911. Do not wait to see if the symptoms will go away. Do not drive yourself to the hospital. Summary After the procedure, it is common to have a small amount of blood in your stool. You may also have mild cramping and bloating of your abdomen. If you were given a sedative during the procedure, it can affect you for several hours. Do not drive or operate machinery until your health care provider says that it is safe. Get help right away if you have a lot of blood in your stool, nausea or vomiting, a fever, or increased pain in your abdomen. This information is not intended to replace advice given to you by your health care provider. Make sure you discuss any questions you have with your health care provider. Document Revised: 06/28/2021 Document Reviewed: 06/28/2021 Elsevier Patient Education  [place] After This sheet gives you information about how to care for yourself after your procedure. Your health care provider may also give you more specific instructions. If you have problems or questions, contact your health care provider. What can I expect after the procedure? After the procedure, it is common to have: Tiredness. Forgetfulness about what happened after the procedure. Impaired judgment for important decisions. Nausea or vomiting. Some difficulty with balance. Follow these instructions at home: For the time period you were told by your health care provider:     Rest as needed. Do not participate in activities where you could fall or become injured. Do not drive or use machinery. Do not drink alcohol. Do not take sleeping pills or medicines that cause drowsiness. Do not make important decisions or sign legal documents. Do not take care of children on your  own. Eating and drinking Follow the diet that is recommended by your health care provider. Drink enough fluid to keep your urine pale yellow. If you vomit: Drink water, juice, or soup when you can drink without vomiting. Make sure you have little or no nausea before eating solid foods. General instructions Have a responsible adult stay with you for the time you are told. It is important to have someone help care for you until you are awake and alert. Take over-the-counter and prescription medicines only as told by your health care provider. If you have sleep apnea, surgery and certain medicines can increase your risk for breathing problems. Follow instructions from your health care provider about wearing your sleep device: Anytime you are sleeping, including during daytime naps. While taking prescription pain medicines, sleeping medicines, or medicines that make you drowsy. Avoid smoking.  Keep all follow-up visits as told by your health care provider. This is important. Contact a health care provider if: You keep feeling nauseous or you keep vomiting. You feel light-headed. You are still sleepy or having trouble with balance after 24 hours. You develop a rash. You have a fever. You have redness or swelling around the IV site. Get help right away if: You have trouble breathing. You have new-onset confusion at home. Summary For several hours after your procedure, you may feel tired. You may also be forgetful and have poor judgment. Have a responsible adult stay with you for the time you are told. It is important to have someone help care for you until you are awake and alert. Rest as told. Do not drive or operate machinery. Do not drink alcohol or take sleeping pills. Get help right away if you have trouble breathing, or if you suddenly become confused. This information is not intended to replace advice given to you by your health care provider. Make sure you discuss any questions you  have with your health care provider. Document Revised: 10/10/2021 Document Reviewed: 10/08/2019 Elsevier Patient Education  [place].

## 2022-08-08 ENCOUNTER — Encounter (HOSPITAL_COMMUNITY): Payer: PPO

## 2022-08-08 ENCOUNTER — Encounter (HOSPITAL_COMMUNITY): Payer: Self-pay

## 2022-08-08 ENCOUNTER — Encounter (HOSPITAL_COMMUNITY)
Admission: RE | Admit: 2022-08-08 | Discharge: 2022-08-08 | Disposition: A | Discharge status: Home / Self Care | Payer: HMO | Source: Ambulatory Visit | Attending: Internal Medicine | Admitting: Internal Medicine

## 2022-08-08 VITALS — BP 143/66 | HR 62 | Temp 97.6°F | Resp 18 | Ht 72.0 in | Wt 200.0 lb

## 2022-08-08 DIAGNOSIS — I10 Essential (primary) hypertension: Secondary | ICD-10-CM

## 2022-08-08 HISTORY — DX: Gastro-esophageal reflux disease without esophagitis: K21.9

## 2022-08-10 ENCOUNTER — Encounter (HOSPITAL_COMMUNITY): Payer: Self-pay | Admitting: Internal Medicine

## 2022-08-10 ENCOUNTER — Ambulatory Visit (HOSPITAL_COMMUNITY): Payer: HMO | Admitting: Certified Registered"

## 2022-08-10 ENCOUNTER — Ambulatory Visit (HOSPITAL_COMMUNITY)
Admission: RE | Admit: 2022-08-10 | Discharge: 2022-08-10 | Disposition: A | Discharge status: Home / Self Care | Payer: HMO | Attending: Internal Medicine | Admitting: Internal Medicine

## 2022-08-10 ENCOUNTER — Ambulatory Visit (HOSPITAL_BASED_OUTPATIENT_CLINIC_OR_DEPARTMENT_OTHER): Payer: HMO | Admitting: Certified Registered"

## 2022-08-10 ENCOUNTER — Encounter (HOSPITAL_COMMUNITY)
Admission: RE | Disposition: A | Discharge status: Home / Self Care | Payer: Self-pay | Source: Home / Self Care | Attending: Internal Medicine

## 2022-08-10 DIAGNOSIS — Z09 Encounter for follow-up examination after completed treatment for conditions other than malignant neoplasm: Secondary | ICD-10-CM | POA: Diagnosis not present

## 2022-08-10 DIAGNOSIS — K635 Polyp of colon: Secondary | ICD-10-CM | POA: Diagnosis not present

## 2022-08-10 DIAGNOSIS — K5730 Diverticulosis of large intestine without perforation or abscess without bleeding: Secondary | ICD-10-CM | POA: Diagnosis not present

## 2022-08-10 DIAGNOSIS — Z86010 Personal history of colonic polyps: Secondary | ICD-10-CM | POA: Insufficient documentation

## 2022-08-10 DIAGNOSIS — D122 Benign neoplasm of ascending colon: Secondary | ICD-10-CM | POA: Diagnosis not present

## 2022-08-10 DIAGNOSIS — I10 Essential (primary) hypertension: Secondary | ICD-10-CM | POA: Diagnosis not present

## 2022-08-10 DIAGNOSIS — I2510 Atherosclerotic heart disease of native coronary artery without angina pectoris: Secondary | ICD-10-CM | POA: Insufficient documentation

## 2022-08-10 DIAGNOSIS — Z1211 Encounter for screening for malignant neoplasm of colon: Secondary | ICD-10-CM | POA: Insufficient documentation

## 2022-08-10 DIAGNOSIS — K219 Gastro-esophageal reflux disease without esophagitis: Secondary | ICD-10-CM | POA: Diagnosis not present

## 2022-08-10 HISTORY — PX: COLONOSCOPY WITH PROPOFOL: SHX5780

## 2022-08-10 HISTORY — PX: POLYPECTOMY: SHX5525

## 2022-08-10 SURGERY — COLONOSCOPY WITH PROPOFOL
Anesthesia: General

## 2022-08-10 MED ORDER — LACTATED RINGERS IV SOLN: INTRAVENOUS | Status: DC | PRN

## 2022-08-10 MED ORDER — LIDOCAINE HCL (PF) 2 % IJ SOLN
INTRAMUSCULAR | Status: AC
  Filled 2022-08-10: qty 5

## 2022-08-10 MED ORDER — PROPOFOL 10 MG/ML IV BOLUS
INTRAVENOUS | Status: AC
  Filled 2022-08-10: qty 20

## 2022-08-10 MED ORDER — PROPOFOL 10 MG/ML IV BOLUS
INTRAVENOUS | Status: DC | PRN
  Administered 2022-08-10: 80 mg via INTRAVENOUS

## 2022-08-10 MED ORDER — PROPOFOL 500 MG/50ML IV EMUL
INTRAVENOUS | Status: DC | PRN
  Administered 2022-08-10: 150 ug/kg/min via INTRAVENOUS

## 2022-08-10 MED ORDER — PROPOFOL 1000 MG/100ML IV EMUL
INTRAVENOUS | Status: AC
  Filled 2022-08-10: qty 200

## 2022-08-10 MED ORDER — LIDOCAINE HCL (CARDIAC) PF 100 MG/5ML IV SOSY
PREFILLED_SYRINGE | INTRAVENOUS | Status: DC | PRN
  Administered 2022-08-10: 50 mg via INTRAVENOUS

## 2022-08-10 MED ORDER — PHENYLEPHRINE 80 MCG/ML (10ML) SYRINGE FOR IV PUSH (FOR BLOOD PRESSURE SUPPORT)
PREFILLED_SYRINGE | INTRAVENOUS | Status: AC
  Filled 2022-08-10: qty 10

## 2022-08-10 NOTE — Discharge Instructions (Signed)
  Colonoscopy Discharge Instructions  Read the instructions outlined below and refer to this sheet in the next few weeks. These discharge instructions provide you with general information on caring for yourself after you leave the hospital. Your doctor may also give you specific instructions. While your treatment has been planned according to the most current medical practices available, unavoidable complications occasionally occur. If you have any problems or questions after discharge, call Dr. Gala Romney at [phone]. ACTIVITY You may resume your regular activity, but move at a slower pace for the next 24 hours.  Take frequent rest periods for the next 24 hours.  Walking will help get rid of the air and reduce the bloated feeling in your belly (abdomen).  No driving for 24 hours (because of the medicine (anesthesia) used during the test).   Do not sign any important legal documents or operate any machinery for 24 hours (because of the anesthesia used during the test).  NUTRITION Drink plenty of fluids.  You may resume your normal diet as instructed by your doctor.  Begin with a light meal and progress to your normal diet. Heavy or fried foods are harder to digest and may make you feel sick to your stomach (nauseated).  Avoid alcoholic beverages for 24 hours or as instructed.  MEDICATIONS You may resume your normal medications unless your doctor tells you otherwise.  WHAT YOU CAN EXPECT TODAY Some feelings of bloating in the abdomen.  Passage of more gas than usual.  Spotting of blood in your stool or on the toilet paper.  IF YOU HAD POLYPS REMOVED DURING THE COLONOSCOPY: No aspirin products for 7 days or as instructed.  No alcohol for 7 days or as instructed.  Eat a soft diet for the next 24 hours.  FINDING OUT THE RESULTS OF YOUR TEST Not all test results are available during your visit. If your test results are not back during the visit, make an appointment with your caregiver to find out the  results. Do not assume everything is normal if you have not heard from your caregiver or the medical facility. It is important for you to follow up on all of your test results.  SEEK IMMEDIATE MEDICAL ATTENTION IF: You have more than a spotting of blood in your stool.  Your belly is swollen (abdominal distention).  You are nauseated or vomiting.  You have a temperature over 101.  You have abdominal pain or discomfort that is severe or gets worse throughout the day.    1 polyp removed from your colon today  Colon polyp and diverticulosis information provided  Further recommendations to follow pending review of pathology report  At patient request, I called Delila Spence at [phone] findings and recommendations

## 2022-08-10 NOTE — Op Note (Signed)
[facility] Patient Name: Roger [name] Procedure Date: 08/10/2022 7:28 AM MRN: [id number] Date of Birth: Mar 13, 1946 Attending MD: Norvel Richards , MD CSN: [id number] Age: 76 Admit Type: Outpatient Procedure:                Colonoscopy Indications:              High risk colon cancer surveillance: Personal                            history of colonic polyps Providers:                Norvel Richards, MD, Caprice Kluver, Rosina Lowenstein,                            RN, Raphael Gibney, Technician Referring MD:              Medicines:                Propofol per Anesthesia Complications:            No immediate complications. Estimated Blood Loss:     Estimated blood loss was minimal. Estimated blood                            loss was minimal. Procedure:                Pre-Anesthesia Assessment:                           - Prior to the procedure, a History and Physical                            was performed, and patient medications and                            allergies were reviewed. The patient's tolerance of                            previous anesthesia was also reviewed. The risks                            and benefits of the procedure and the sedation                            options and risks were discussed with the patient.                            All questions were answered, and informed consent                            was obtained. Prior Anticoagulants: The patient has                            taken no previous anticoagulant or antiplatelet  agents. ASA Grade Assessment: II - A patient with                            mild systemic disease. After reviewing the risks                            and benefits, the patient was deemed in                            satisfactory condition to undergo the procedure.                           After obtaining informed consent, the colonoscope                            was passed under  direct vision. Throughout the                            procedure, the patient's blood pressure, pulse, and                            oxygen saturations were monitored continuously. The                            [id number]) scope was introduced through the                            anus and advanced to the the cecum, identified by                            appendiceal orifice and ileocecal valve. The                            colonoscopy was performed without difficulty. The                            patient tolerated the procedure well. The quality                            of the bowel preparation was adequate. Scope In: 7:35:37 AM Scope Out: 7:50:00 AM Scope Withdrawal Time: 0 hours 9 minutes 20 seconds  Total Procedure Duration: 0 hours 14 minutes 23 seconds  Findings:      The perianal and digital rectal examinations were normal.      Scattered medium-mouthed diverticula were found in the sigmoid colon and       descending colon.      A 6 mm polyp was found in the ascending colon. The polyp was       semi-pedunculated. The polyp was removed with a cold snare. Resection       and retrieval were complete. Estimated blood loss was minimal.      The exam was otherwise without abnormality on direct and retroflexion       views. Impression:               - Diverticulosis in  the sigmoid colon and in the                            descending colon.                           - One 6 mm polyp in the ascending colon, removed                            with a cold snare. Resected and retrieved.                           - The examination was otherwise normal on direct                            and retroflexion views. Moderate Sedation:      Moderate (conscious) sedation was personally administered by an       anesthesia professional. The following parameters were monitored: oxygen       saturation, heart rate, blood pressure, respiratory rate, EKG, adequacy       of pulmonary  ventilation, and response to care. Recommendation:           - Patient has a contact number available for                            emergencies. The signs and symptoms of potential                            delayed complications were discussed with the                            patient. Return to normal activities tomorrow.                            Written discharge instructions were provided to the                            patient.                           - Resume previous diet.                           - Continue present medications.                           - Repeat colonoscopy date to be determined after                            pending pathology results are reviewed for                            surveillance.                           - Return to GI office (date not yet determined). Procedure Code(s):        ---  Professional ---                           [phone], Colonoscopy, flexible; with removal of                            tumor(s), polyp(s), or other lesion(s) by snare                            technique Diagnosis Code(s):        --- Professional ---                           Z86.010, Personal history of colonic polyps                           K63.5, Polyp of colon                           K57.30, Diverticulosis of large intestine without                            perforation or abscess without bleeding CPT copyright 2019 American Medical Association. All rights reserved. The codes documented in this report are preliminary and upon coder review may  be revised to meet current compliance requirements. [name]. [name], MD Norvel Richards, MD 08/10/2022 8:01:39 AM This report has been signed electronically. Number of Addenda: 0

## 2022-08-10 NOTE — Anesthesia Preprocedure Evaluation (Signed)
Anesthesia Evaluation  Patient identified by MRN, date of birth, ID band Patient awake    Reviewed: Allergy & Precautions, H&P , NPO status , Patient's Chart, lab work & pertinent test results, reviewed documented beta blocker date and time   Airway Mallampati: II  TM Distance: >3 FB Neck ROM: full    Dental no notable dental hx.    Pulmonary neg pulmonary ROS,    Pulmonary exam normal breath sounds clear to auscultation       Cardiovascular Exercise Tolerance: Good hypertension, + CAD   Rhythm:regular Rate:Normal     Neuro/Psych negative neurological ROS  negative psych ROS   GI/Hepatic Neg liver ROS, GERD  Medicated,  Endo/Other  negative endocrine ROS  Renal/GU CRFRenal disease  negative genitourinary   Musculoskeletal   Abdominal   Peds  Hematology negative hematology ROS (+)   Anesthesia Other Findings   Reproductive/Obstetrics negative OB ROS                             Anesthesia Physical Anesthesia Plan  ASA: 2  Anesthesia Plan: General   Post-op Pain Management:    Induction:   PONV Risk Score and Plan: Propofol infusion  Airway Management Planned:   Additional Equipment:   Intra-op Plan:   Post-operative Plan:   Informed Consent: I have reviewed the patients History and Physical, chart, labs and discussed the procedure including the risks, benefits and alternatives for the proposed anesthesia with the patient or authorized representative who has indicated his/her understanding and acceptance.     Dental Advisory Given  Plan Discussed with: CRNA  Anesthesia Plan Comments:         Anesthesia Quick Evaluation

## 2022-08-10 NOTE — H&P (Signed)
$'@LOGO'S$ @   Primary Care Physician:  [name], DO Primary Gastroenterologist:  Dr. Gala Romney  Pre-Procedure History & Physical: HPI:  Roger [name] is a 76 y.o. male here for surveillance colonoscopy.  History of serrated adenoma removed 2019.  No bowel symptoms at this time.  Past Medical History:  Diagnosis Date   Adenomatous polyps    CAD (coronary artery disease)    GERD (gastroesophageal reflux disease)    Hypertension    Impaired fasting glucose    Seasonal allergies     Past Surgical History:  Procedure Laterality Date   COLONOSCOPY  10/03/2005   inflamed hyperplastic polyp/adenomatous poylp, left sided diverticula   COLONOSCOPY  03/03/2012   Procedure: COLONOSCOPY;  Surgeon: Daneil Dolin, MD;  Location: [facility];  Service: Endoscopy;  Laterality: N/A;  10:15   COLONOSCOPY N/A 04/11/2017   one 5 mm polyp hepatic flexure. Sigmoid diverticulosis. Sessile serrated adenoma.   CORONARY ANGIOPLASTY WITH STENT PLACEMENT  07/16/2014   "1"   KNEE ARTHROSCOPY Right ~ 1994   KNEE ARTHROSCOPY Left ~ 2001   LEFT AND RIGHT HEART CATHETERIZATION WITH CORONARY ANGIOGRAM N/A 07/16/2014   Procedure: LEFT AND RIGHT HEART CATHETERIZATION WITH CORONARY ANGIOGRAM;  Surgeon: [name], MD;  Location: [facility] CATH LAB;  Service: Cardiovascular;  Laterality: N/A;   POLYPECTOMY  04/11/2017   Procedure: POLYPECTOMY;  Surgeon: Daneil Dolin, MD;  Location: [facility];  Service: Endoscopy;;  colon    Prior to Admission medications   Medication Sig Start Date End Date Taking? Authorizing Provider  acetaminophen (TYLENOL) 500 MG tablet Take 500-1,000 mg by mouth every 6 (six) hours as needed for mild pain or headache.    Yes [provider]  aspirin 81 MG tablet Take 81 mg by mouth daily.   Yes [provider]  atorvastatin (LIPITOR) 40 MG tablet TAKE 1 TABLET BY MOUTH EVERY DAY IN THE EVENING 07/02/22  Yes Cook, Jayce G, DO  cetirizine (ZYRTEC) 10 MG tablet  Take 10 mg by mouth daily as needed for allergies.   Yes [provider]  cholecalciferol (VITAMIN D3) 25 MCG (1000 UNIT) tablet Take 1,000 Units by mouth daily. 11/30/19  Yes [provider]  famotidine (PEPCID) 40 MG tablet TAKE 1 TABLET BY MOUTH EVERY DAY 07/16/22  Yes Cook, Jayce G, DO  ketoconazole (NIZORAL) 2 % cream Apply 1 Application topically daily as needed for irritation. 05/08/22  Yes [provider]  lisinopril (ZESTRIL) 10 MG tablet TAKE 1 TABLET BY MOUTH EVERY DAY 07/04/22  Yes Cook, Jayce G, DO  metoprolol tartrate (LOPRESSOR) 25 MG tablet TAKE HALF A TABLET BY MOUTH TWICE A DAY 02/19/22  Yes Josue Hector, MD  Zinc 50 MG TABS Take 50 mg by mouth daily. 11/30/19  Yes [provider]  fluconazole (DIFLUCAN) 150 MG tablet 2 tablets once weekly for 2 weeks. Patient not taking: Reported on 08/03/2022 07/04/22   [name], DO  nitroGLYCERIN (NITROSTAT) 0.4 MG SL tablet ONE TABLET UNDER THE TONGUE EVERY 5 MINUTES AS NEEDED FOR CHEST PAIN. MAX 3 DOSES IN 15 MIN 11/28/21   [name], DO    Allergies as of 06/26/2022   (No Known Allergies)    Family History  Problem Relation Age of Onset   Liver cancer Father    Cancer Father        liver   Colon cancer Neg Hx     Social History   Socioeconomic History  Marital status: Married    Spouse name: Not on file   Number of children: Not on file   Years of education: Not on file   Highest education level: Not on file  Occupational History   Not on file  Tobacco Use   Smoking status: Never   Smokeless tobacco: Never  Vaping Use   Vaping Use: Never used  Substance and Sexual Activity   Alcohol use: Yes    Alcohol/week: 2.0 standard drinks of alcohol    Types: 2 Glasses of wine per week   Drug use: No   Sexual activity: Yes  Other Topics Concern   Not on file  Social History Narrative   Not on file   Social Determinants of Health   Financial Resource Strain: Not on file  Food  Insecurity: Not on file  Transportation Needs: Not on file  Physical Activity: Not on file  Stress: Not on file  Social Connections: Not on file  Intimate Partner Violence: Not on file    Review of Systems: See HPI, otherwise negative ROS  Physical Exam: BP (!) 150/74   Pulse (!) 54   Temp 97.8 F (36.6 C) (Oral)   Resp 18   Ht 6' (1.829 m)   Wt 90.7 kg   SpO2 100%   BMI 27.12 kg/m  General:   Alert,  Well-developed, well-nourished, pleasant and cooperative in NAD Neck:  Supple; no masses or thyromegaly. No significant cervical adenopathy. Lungs:  Clear throughout to auscultation.   No wheezes, crackles, or rhonchi. No acute distress. Heart:  Regular rate and rhythm; no murmurs, clicks, rubs,  or gallops. Abdomen: Non-distended, normal bowel sounds.  Soft and nontender without appreciable mass or hepatosplenomegaly.  Pulses:  Normal pulses noted. Extremities:  Without clubbing or edema.  Impression/Plan: 76 year old gentleman here for surveillance colonoscopy.  History of serrated adenoma removed previously. The risks, benefits, limitations, alternatives and imponderables have been reviewed with the patient. Questions have been answered. All parties are agreeable.       Notice: This dictation was prepared with Dragon dictation along with smaller phrase technology. Any transcriptional errors that result from this process are unintentional and may not be corrected upon review.

## 2022-08-10 NOTE — Transfer of Care (Signed)
Immediate Anesthesia Transfer of Care Note  Patient: Roger [name]  Procedure(s) Performed: COLONOSCOPY WITH PROPOFOL POLYPECTOMY  Patient Location: PACU  Anesthesia Type:MAC  Level of Consciousness: drowsy and patient cooperative  Airway & Oxygen Therapy: Patient Spontanous Breathing  Post-op Assessment: Report given to RN and Post -op Vital signs reviewed and stable  Post vital signs: Reviewed and stable  Last Vitals:  Vitals Value Taken Time  BP 102/60 08/10/22 0753  Temp 36.4 C 08/10/22 0753  Pulse 67 08/10/22 0753  Resp 20 08/10/22 0753  SpO2 97 % 08/10/22 0753    Last Pain:  Vitals:   08/10/22 0753  TempSrc: Oral  PainSc: 0-No pain      Patients Stated Pain Goal: 6 (79/43/27 [id number])  Complications: No notable events documented.

## 2022-08-11 NOTE — Anesthesia Postprocedure Evaluation (Signed)
Anesthesia Post Note  Patient: Roger [name]  Procedure(s) Performed: COLONOSCOPY WITH PROPOFOL POLYPECTOMY  Patient location during evaluation: Phase II Anesthesia Type: General Level of consciousness: awake Pain management: pain level controlled Vital Signs Assessment: post-procedure vital signs reviewed and stable Respiratory status: spontaneous breathing and respiratory function stable Cardiovascular status: blood pressure returned to baseline and stable Postop Assessment: no headache and no apparent nausea or vomiting Anesthetic complications: no Comments: Late entry   No notable events documented.   Last Vitals:  Vitals:   08/10/22 0700 08/10/22 0753  BP: (!) 150/74 102/60  Pulse: (!) 54 67  Resp: 18 20  Temp: 36.6 C (!) 36.4 C  SpO2: 100% 97%    Last Pain:  Vitals:   08/10/22 0753  TempSrc: Oral  PainSc: 0-No pain                 Louann Sjogren

## 2022-08-13 ENCOUNTER — Encounter: Payer: Self-pay | Admitting: Internal Medicine

## 2022-08-13 LAB — SURGICAL PATHOLOGY

## 2022-08-20 ENCOUNTER — Encounter (HOSPITAL_COMMUNITY): Payer: Self-pay | Admitting: Internal Medicine

## 2022-09-25 ENCOUNTER — Ambulatory Visit (INDEPENDENT_AMBULATORY_CARE_PROVIDER_SITE_OTHER): Payer: HMO | Admitting: Family Medicine

## 2022-09-25 ENCOUNTER — Encounter: Payer: Self-pay | Admitting: Family Medicine

## 2022-09-25 VITALS — BP 128/62 | HR 61 | Wt 204.2 lb

## 2022-09-25 DIAGNOSIS — M19041 Primary osteoarthritis, right hand: Secondary | ICD-10-CM | POA: Diagnosis not present

## 2022-09-25 DIAGNOSIS — M19049 Primary osteoarthritis, unspecified hand: Secondary | ICD-10-CM | POA: Insufficient documentation

## 2022-09-25 MED ORDER — DICLOFENAC SODIUM 1 % EX GEL: 2.0000 g | Freq: Four times a day (QID) | CUTANEOUS | 0 refills | Status: DC | PRN

## 2022-09-25 NOTE — Assessment & Plan Note (Signed)
I believe the patient is experiencing osteoarthritis.  Mild. Given his comorbidities, treating with Tylenol and topical diclofenac.

## 2022-09-25 NOTE — Progress Notes (Signed)
Subjective:  Patient ID: Roger [name], male    DOB: 1946/08/26  Age: 76 y.o. MRN: [id number]  CC: Chief Complaint  Patient presents with   Hand Pain    Pt arrives due to pain and stiffness in trigger finger and middle finger on right hand. Pt states trigger finger issues have been going on for 3 years; middle finger began last 2 month     HPI: 76 year old male with CAD, hypertension, CKD 3, hyperlipidemia presents for evaluation of the above.  Patient reports ongoing issues with pain of his index finger of his right hand.  Also has pain in the middle finger of the right hand.  Primarily of the MCP joints.  The middle finger is the most troublesome.  This has been going on for the past 3 months.  The index finger has been bothering him for the past 3 years.  He stays very active.  He is concerned that he has arthritis.  No medications or interventions tried.  No relieving factors.  He states that this is mild in severity.  No other complaints.  Patient Active Problem List   Diagnosis Date Noted   Osteoarthritis, hand 09/25/2022   Tinea versicolor 07/04/2022   Tinnitus of both ears 10/06/2020   Stage 3 chronic kidney disease ([place]) 03/03/2020   Essential hypertension 07/17/2014   Hyperlipidemia 07/17/2014   CAD- s/p PCI + DES to mid LAD 07/16/14 07/17/2014    Social Hx   Social History   Socioeconomic History   Marital status: Married    Spouse name: Not on file   Number of children: Not on file   Years of education: Not on file   Highest education level: Not on file  Occupational History   Not on file  Tobacco Use   Smoking status: Never   Smokeless tobacco: Never  Vaping Use   Vaping Use: Never used  Substance and Sexual Activity   Alcohol use: Yes    Alcohol/week: 2.0 standard drinks of alcohol    Types: 2 Glasses of wine per week   Drug use: No   Sexual activity: Yes  Other Topics Concern   Not on file  Social History Narrative   Not on file   Social  Determinants of Health   Financial Resource Strain: Not on file  Food Insecurity: Not on file  Transportation Needs: Not on file  Physical Activity: Not on file  Stress: Not on file  Social Connections: Not on file    Review of Systems Per HPI  Objective:  BP 128/62   Pulse 61   Wt 204 lb 3.2 oz (92.6 kg)   SpO2 98%   BMI 27.69 kg/m      09/25/2022    9:03 AM 08/10/2022    7:53 AM 08/10/2022    7:00 AM  BP/Weight  Systolic BP [phone]  Diastolic BP 62 60 74  Wt. (Lbs) 204.2  199.96  BMI 27.69 kg/m2  27.12 kg/m2    Physical Exam Vitals and nursing note reviewed.  Constitutional:      General: He is not in acute distress.    Appearance: Normal appearance.  HENT:     Head: Normocephalic and atraumatic.  Pulmonary:     Effort: Pulmonary effort is normal. No respiratory distress.  Musculoskeletal:     Comments: Right hand -mild swelling of the second and third MCP joints.  No significant tenderness of the PIP joints or DIP joints.  Neurological:  Mental Status: He is alert.  Psychiatric:        Mood and Affect: Mood normal.        Behavior: Behavior normal.     Lab Results  Component Value Date   WBC 6.7 07/09/2022   HGB 14.7 07/09/2022   HCT 43.9 07/09/2022   PLT 177 07/09/2022   GLUCOSE 112 (H) 07/09/2022   CHOL 129 07/09/2022   TRIG 65 07/09/2022   HDL 49 07/09/2022   LDLCALC 66 07/09/2022   ALT 25 07/09/2022   AST 21 07/09/2022   NA 143 07/09/2022   K 4.5 07/09/2022   CL 104 07/09/2022   CREATININE 1.30 (H) 07/09/2022   BUN 19 07/09/2022   CO2 23 07/09/2022   PSA 0.39 10/19/2014   INR 0.98 07/14/2014     Assessment & Plan:   Problem List Items Addressed This Visit       Musculoskeletal and Integument   Osteoarthritis, hand - Primary    I believe the patient is experiencing osteoarthritis.  Mild. Given his comorbidities, treating with Tylenol and topical diclofenac.       Meds ordered this encounter  Medications   diclofenac  Sodium (VOLTAREN) 1 % GEL    Sig: Apply 2 g topically 4 (four) times daily as needed (Pain).    Dispense:  100 g    Refill:  [facility]

## 2022-09-25 NOTE — Patient Instructions (Signed)
Tylenol 1000 mg 3 times daily as needed.  Voltaren as directed.  Take care  Dr. Lacinda Axon

## 2022-10-04 ENCOUNTER — Other Ambulatory Visit: Payer: Self-pay | Admitting: Orthopedic Surgery

## 2022-10-04 DIAGNOSIS — M48061 Spinal stenosis, lumbar region without neurogenic claudication: Secondary | ICD-10-CM

## 2022-10-04 DIAGNOSIS — M545 Low back pain, unspecified: Secondary | ICD-10-CM

## 2022-10-09 ENCOUNTER — Other Ambulatory Visit: Payer: Self-pay | Admitting: Family Medicine

## 2022-10-22 ENCOUNTER — Encounter: Payer: Self-pay | Admitting: Family Medicine

## 2022-10-22 ENCOUNTER — Other Ambulatory Visit: Payer: Self-pay | Admitting: Family Medicine

## 2022-12-20 ENCOUNTER — Telehealth: Payer: Self-pay | Admitting: Family Medicine

## 2022-12-20 DIAGNOSIS — I10 Essential (primary) hypertension: Secondary | ICD-10-CM

## 2022-12-20 DIAGNOSIS — E785 Hyperlipidemia, unspecified: Secondary | ICD-10-CM

## 2022-12-20 DIAGNOSIS — N183 Chronic kidney disease, stage 3 unspecified: Secondary | ICD-10-CM

## 2022-12-20 DIAGNOSIS — R739 Hyperglycemia, unspecified: Secondary | ICD-10-CM

## 2022-12-20 NOTE — Telephone Encounter (Signed)
Patient has appointment on 3/1 and wanting to know if he needs labs done.

## 2022-12-20 NOTE — Telephone Encounter (Signed)
Last labs completed 07/09/22 Lipid, CMP14+EGFR, CBC. Please advise. Thank you

## 2022-12-21 NOTE — Telephone Encounter (Signed)
Lab orders placed. My chart message sent to pt.

## 2022-12-24 ENCOUNTER — Other Ambulatory Visit: Payer: Self-pay | Admitting: Family Medicine

## 2022-12-25 ENCOUNTER — Other Ambulatory Visit: Payer: Self-pay

## 2022-12-25 MED ORDER — FAMOTIDINE 40 MG PO TABS: 40.0000 mg | ORAL_TABLET | Freq: Every day | ORAL | 1 refills | Status: DC

## 2022-12-25 NOTE — Telephone Encounter (Signed)
My chart message read by patient.

## 2022-12-26 DIAGNOSIS — I10 Essential (primary) hypertension: Secondary | ICD-10-CM | POA: Diagnosis not present

## 2022-12-26 DIAGNOSIS — N183 Chronic kidney disease, stage 3 unspecified: Secondary | ICD-10-CM | POA: Diagnosis not present

## 2022-12-26 DIAGNOSIS — R739 Hyperglycemia, unspecified: Secondary | ICD-10-CM | POA: Diagnosis not present

## 2022-12-26 DIAGNOSIS — E785 Hyperlipidemia, unspecified: Secondary | ICD-10-CM | POA: Diagnosis not present

## 2022-12-27 LAB — CBC WITH DIFFERENTIAL/PLATELET
Basophils Absolute: 0.1 10*3/uL (ref 0.0–0.2)
Basos: 1 %
EOS (ABSOLUTE): 0.6 10*3/uL — ABNORMAL HIGH (ref 0.0–0.4)
Eos: 9 %
Hematocrit: 44.5 % (ref 37.5–51.0)
Hemoglobin: 15.1 g/dL (ref 13.0–17.7)
Immature Grans (Abs): 0 10*3/uL (ref 0.0–0.1)
Immature Granulocytes: 0 %
Lymphocytes Absolute: 1.4 10*3/uL (ref 0.7–3.1)
Lymphs: 20 %
MCH: 30.1 pg (ref 26.6–33.0)
MCHC: 33.9 g/dL (ref 31.5–35.7)
MCV: 89 fL (ref 79–97)
Monocytes Absolute: 0.9 10*3/uL (ref 0.1–0.9)
Monocytes: 12 %
Neutrophils Absolute: 4.3 10*3/uL (ref 1.4–7.0)
Neutrophils: 58 %
Platelets: 228 10*3/uL (ref 150–450)
RBC: 5.01 x10E6/uL (ref 4.14–5.80)
RDW: 12.9 % (ref 11.6–15.4)
WBC: 7.3 10*3/uL (ref 3.4–10.8)

## 2022-12-27 LAB — COMPREHENSIVE METABOLIC PANEL
ALT: 19 IU/L (ref 0–44)
AST: 24 IU/L (ref 0–40)
Albumin/Globulin Ratio: 1.9 (ref 1.2–2.2)
Albumin: 4.5 g/dL (ref 3.8–4.8)
Alkaline Phosphatase: 112 IU/L (ref 44–121)
BUN/Creatinine Ratio: 9 — ABNORMAL LOW (ref 10–24)
BUN: 15 mg/dL (ref 8–27)
Bilirubin Total: 0.7 mg/dL (ref 0.0–1.2)
CO2: 25 mmol/L (ref 20–29)
Calcium: 9.3 mg/dL (ref 8.6–10.2)
Chloride: 102 mmol/L (ref 96–106)
Creatinine, Ser: 1.68 mg/dL — ABNORMAL HIGH (ref 0.76–1.27)
Globulin, Total: 2.4 g/dL (ref 1.5–4.5)
Glucose: 101 mg/dL — ABNORMAL HIGH (ref 70–99)
Potassium: 4.5 mmol/L (ref 3.5–5.2)
Sodium: 140 mmol/L (ref 134–144)
Total Protein: 6.9 g/dL (ref 6.0–8.5)
eGFR: 42 mL/min/{1.73_m2} — ABNORMAL LOW (ref >59)

## 2022-12-27 LAB — LIPID PANEL
Chol/HDL Ratio: 2.9 ratio (ref 0.0–5.0)
Cholesterol, Total: 130 mg/dL (ref 100–199)
HDL: 45 mg/dL (ref >39)
LDL Chol Calc (NIH): 65 mg/dL (ref 0–99)
Triglycerides: 109 mg/dL (ref 0–149)
VLDL Cholesterol Cal: 20 mg/dL (ref 5–40)

## 2022-12-27 LAB — HEMOGLOBIN A1C
Est. average glucose Bld gHb Est-mCnc: 128 mg/dL
Hgb A1c MFr Bld: 6.1 % — ABNORMAL HIGH (ref 4.8–5.6)

## 2023-01-01 ENCOUNTER — Other Ambulatory Visit: Payer: Self-pay | Admitting: Family Medicine

## 2023-01-01 DIAGNOSIS — N1832 Chronic kidney disease, stage 3b: Secondary | ICD-10-CM

## 2023-01-01 NOTE — Progress Notes (Signed)
Primary Cardiologist: Johnsie Cancel  SUBJECTIVE:  77 y.o. f/u CAD. DES to [place] in 2015. D1 jailed at time His EF was normal by  TTE 2015 with trivial AR/MR He is on beta blocker, ACE and statin along with Baby aspirin He has CRF with baseline Cr of 1.4  He is very active with no angina Had classic angina prior to his stent Has daughter with 3 kids local and son with a child He is retired from TEFL teacher to travel Writer of [employer] and big fan of school   Primary [place] checks labs  Back problems  MRI done 10/25/21 with multilevel mild/moderate degenerative dx but only  Mild stenosis L3-5 Sees Dr Aline Brochure Left leg is better now  Had nice trip to [place] and going to [place] this spring    Soc Hx: He graduated from [employer] with a degree in Charity fundraiser.  He worked in the [employer] for 38 years.  His sister-in-law, Manus Gunning, is a Cabin crew in [place].  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  Review of Systems: As per "subjective", otherwise negative.  Allergies  Allergen Reactions   Ketoconazole     Current Outpatient Medications  Medication Sig Dispense Refill   acetaminophen (TYLENOL) 500 MG tablet Take 500-1,000 mg by mouth every 6 (six) hours as needed for mild pain or headache.      aspirin 81 MG tablet Take 81 mg by mouth daily.     atorvastatin (LIPITOR) 40 MG tablet TAKE 1 TABLET BY MOUTH EVERY DAY IN THE EVENING 90 tablet 1   cetirizine (ZYRTEC) 10 MG tablet Take 10 mg by mouth daily as needed for allergies.     cholecalciferol (VITAMIN D3) 25 MCG (1000 UNIT) tablet Take 1,000 Units by mouth daily.     diclofenac Sodium (VOLTAREN) 1 % GEL APPLY 2 G TOPICALLY 4 (FOUR) TIMES DAILY AS NEEDED (PAIN). 100 g 0   famotidine (PEPCID) 40 MG tablet Take 1 tablet (40 mg total) by mouth daily. 90 tablet 1   ketoconazole (NIZORAL) 2 % cream Apply 1 Application topically daily  as needed for irritation.     lisinopril (ZESTRIL) 10 MG tablet TAKE 1 TABLET BY MOUTH EVERY DAY 90 tablet 1   metoprolol tartrate (LOPRESSOR) 25 MG tablet TAKE HALF A TABLET BY MOUTH TWICE A DAY 90 tablet 3   nitroGLYCERIN (NITROSTAT) 0.4 MG SL tablet ONE TABLET UNDER THE TONGUE EVERY 5 MINUTES AS NEEDED FOR CHEST PAIN. MAX 3 DOSES IN 15 MIN 75 tablet 1   Zinc 50 MG TABS Take 50 mg by mouth daily.     No current facility-administered medications for this visit.    Past Medical History:  Diagnosis Date   Adenomatous polyps    CAD (coronary artery disease)    GERD (gastroesophageal reflux disease)    Hypertension    Impaired fasting glucose    Seasonal allergies     Past Surgical History:  Procedure Laterality Date   COLONOSCOPY  10/03/2005   inflamed hyperplastic polyp/adenomatous poylp, left sided diverticula   COLONOSCOPY  03/03/2012   Procedure: COLONOSCOPY;  Surgeon: Daneil Dolin, MD;  Location: [facility];  Service: Endoscopy;  Laterality: N/A;  10:15   COLONOSCOPY N/A 04/11/2017   one 5 mm polyp hepatic flexure. Sigmoid diverticulosis. Sessile serrated adenoma.   COLONOSCOPY WITH PROPOFOL N/A 08/10/2022   Procedure:  COLONOSCOPY WITH PROPOFOL;  Surgeon: Daneil Dolin, MD;  Location: [facility];  Service: Endoscopy;  Laterality: N/A;  7:30AM   CORONARY ANGIOPLASTY WITH STENT PLACEMENT  07/16/2014   "1"   KNEE ARTHROSCOPY Right ~ 1994   KNEE ARTHROSCOPY Left ~ 2001   LEFT AND RIGHT HEART CATHETERIZATION WITH CORONARY ANGIOGRAM N/A 07/16/2014   Procedure: LEFT AND RIGHT HEART CATHETERIZATION WITH CORONARY ANGIOGRAM;  Surgeon: [name], MD;  Location: [facility] CATH LAB;  Service: Cardiovascular;  Laterality: N/A;   POLYPECTOMY  04/11/2017   Procedure: POLYPECTOMY;  Surgeon: Daneil Dolin, MD;  Location: [facility];  Service: Endoscopy;;  colon   POLYPECTOMY  08/10/2022   Procedure: POLYPECTOMY;  Surgeon: Daneil Dolin, MD;  Location: [facility];  Service:  Endoscopy;;    Social History   Socioeconomic History   Marital status: Married    Spouse name: Not on file   Number of children: Not on file   Years of education: Not on file   Highest education level: Not on file  Occupational History   Not on file  Tobacco Use   Smoking status: Never   Smokeless tobacco: Never  Vaping Use   Vaping Use: Never used  Substance and Sexual Activity   Alcohol use: Yes    Alcohol/week: 2.0 standard drinks of alcohol    Types: 2 Glasses of wine per week   Drug use: No   Sexual activity: Yes  Other Topics Concern   Not on file  Social History Narrative   Not on file   Social Determinants of Health   Financial Resource Strain: Not on file  Food Insecurity: Not on file  Transportation Needs: Not on file  Physical Activity: Not on file  Stress: Not on file  Social Connections: Not on file  Intimate Partner Violence: Not on file      Vitals:   01/14/23 1358  BP: 128/72  Pulse: 61  SpO2: 95%  Weight: 203 lb 3.2 oz (92.2 kg)  Height: 6' (1.829 m)     Wt Readings from Last 3 Encounters:  01/14/23 203 lb 3.2 oz (92.2 kg)  09/25/22 204 lb 3.2 oz (92.6 kg)  08/10/22 199 lb 15.3 oz (90.7 kg)     PHYSICAL EXAM  Affect appropriate Healthy:  appears stated age HEENT: normal Neck supple with no adenopathy JVP normal no bruits no thyromegaly Lungs clear with no wheezing and good diaphragmatic motion Heart:  S1/S2 no murmur, no rub, gallop or click PMI normal Abdomen: benighn, BS positve, no tenderness, no AAA no bruit.  No HSM or HJR Distal pulses intact with no bruits No edema Neuro non-focal Skin warm and dry No muscular weakness   Labs: Lab Results  Component Value Date/Time   K 4.5 12/26/2022 08:05 AM   BUN 15 12/26/2022 08:05 AM   CREATININE 1.68 (H) 12/26/2022 08:05 AM   CREATININE 1.30 10/19/2014 07:43 AM   ALT 19 12/26/2022 08:05 AM   HGB 15.1 12/26/2022 08:05 AM     Lipids: Lab Results  Component Value  Date/Time   LDLCALC 65 12/26/2022 08:05 AM   CHOL 130 12/26/2022 08:05 AM   TRIG 109 12/26/2022 08:05 AM   HDL 45 12/26/2022 08:05 AM     ECG:  SR LVH otherwise normal 01/14/2023   ASSESSMENT AND PLAN:  1. CAD s/p LAD DES: 2015 Stable ischemic heart disease. Continue aspirin, metoprolol, and statin therapy.  Active with no chest pain  2.  Chronic hypertension: continue current meds    3. Hyperlipidemia:  Lipid panel from 10/23/21 reviewed above.  LDL at goal, 65 12/26/22   Continue statin therapy.  4. CRF:  Bi yearly Cr with Dr Wolfgang Phoenix avoid diuretics ACE ok for now Cr 1.68  5. Sciatica:  right leg MRI not surgical may improved no need for injections or surgery    Disposition: Follow up 1 year      Jenkins Rouge MD [facility]

## 2023-01-14 ENCOUNTER — Ambulatory Visit: Payer: HMO | Attending: Cardiovascular Disease | Admitting: Cardiovascular Disease

## 2023-01-14 ENCOUNTER — Encounter: Payer: Self-pay | Admitting: Cardiovascular Disease

## 2023-01-14 VITALS — BP 128/72 | HR 61 | Ht 72.0 in | Wt 203.2 lb

## 2023-01-14 DIAGNOSIS — I10 Essential (primary) hypertension: Secondary | ICD-10-CM | POA: Diagnosis not present

## 2023-01-14 DIAGNOSIS — E785 Hyperlipidemia, unspecified: Secondary | ICD-10-CM | POA: Diagnosis not present

## 2023-01-14 DIAGNOSIS — I2510 Atherosclerotic heart disease of native coronary artery without angina pectoris: Secondary | ICD-10-CM | POA: Diagnosis not present

## 2023-01-14 MED ORDER — NITROGLYCERIN 0.4 MG SL SUBL: SUBLINGUAL_TABLET | SUBLINGUAL | 2 refills | Status: DC

## 2023-01-14 NOTE — Patient Instructions (Signed)
Medication Instructions:  Your physician recommends that you continue on your current medications as directed. Please refer to the Current Medication list given to you today.  *If you need a refill on your cardiac medications before your next appointment, please call your pharmacy*   Lab Work: NONE   If you have labs (blood work) drawn today and your tests are completely normal, you will receive your results only by: [place] (if you have MyChart) OR A paper copy in the mail If you have any lab test that is abnormal or we need to change your treatment, we will call you to review the results.   Testing/Procedures: NONE    Follow-Up: At [facility], you and your health needs are our priority.  As part of our continuing mission to provide you with exceptional heart care, we have created designated Provider Care Teams.  These Care Teams include your primary Cardiologist (physician) and Advanced Practice Providers (APPs -  Physician Assistants and Nurse Practitioners) who all work together to provide you with the care you need, when you need it.  We recommend signing up for the patient portal called "MyChart".  Sign up information is provided on this After Visit Summary.  MyChart is used to connect with patients for Virtual Visits (Telemedicine).  Patients are able to view lab/test results, encounter notes, upcoming appointments, etc.  Non-urgent messages can be sent to your provider as well.   To learn more about what you can do with MyChart, go to [email or web address].    Your next appointment:   1 year(s)  Provider:   You may see Jenkins Rouge, MD or one of the following Advanced Practice Providers on your designated Care Team:   Bernerd Pho, PA-C  Ermalinda Barrios, PA-C     Other Instructions Thank you for choosing [facility]!

## 2023-01-17 ENCOUNTER — Encounter: Payer: Self-pay | Admitting: Radiology

## 2023-01-17 NOTE — Progress Notes (Signed)
Subjective:   Roger [name] is a 77 y.o. male who presents for Medicare Annual/Subsequent preventive examination.  Review of Systems     Cardiac Risk Factors include: advanced age (>69mn, >>49women);dyslipidemia;hypertension;male gender     Objective:    Today's Vitals   01/18/23 0820  BP: 132/68  Weight: 202 lb (91.6 kg)  Height: 6' (1.829 m)   Body mass index is 27.4 kg/m.     01/18/2023    8:40 AM 08/10/2022    6:40 AM 08/08/2022    1:43 PM 04/11/2017    1:13 PM 08/26/2014   12:43 PM 07/16/2014    8:44 AM 03/03/2012    9:24 AM  Advanced Directives  Does Patient Have a Medical Advance Directive? Yes No No Yes Yes Yes Patient does not have advance directive;Patient would not like information  Type of Advance Directive Living will;Healthcare Power of Attorney   [place]   Does patient want to make changes to medical advance directive? No - Patient declined    No - Patient declined    Copy of [place] Chart? Yes - validated most recent copy scanned in chart (See row information)    No - copy requested No - copy requested   Would patient like information on creating a medical advance directive?  No - Patient declined No - Patient declined No - Patient declined     Pre-existing out of facility DNR order (yellow form or pink MOST form)       No    Current Medications (verified) Outpatient Encounter Medications as of 01/18/2023  Medication Sig   acetaminophen (TYLENOL) 500 MG tablet Take 500-1,000 mg by mouth every 6 (six) hours as needed for mild pain or headache.    aspirin 81 MG tablet Take 81 mg by mouth daily.   atorvastatin (LIPITOR) 40 MG tablet TAKE 1 TABLET BY MOUTH EVERY DAY IN THE EVENING   cetirizine (ZYRTEC) 10 MG tablet Take 10 mg by mouth daily as needed for allergies.   cholecalciferol (VITAMIN D3) 25 MCG (1000 UNIT) tablet Take 1,000 Units by mouth daily.    diclofenac Sodium (VOLTAREN) 1 % GEL APPLY 2 G TOPICALLY 4 (FOUR) TIMES DAILY AS NEEDED (PAIN).   famotidine (PEPCID) 40 MG tablet Take 1 tablet (40 mg total) by mouth daily.   ketoconazole (NIZORAL) 2 % cream Apply 1 Application topically daily as needed for irritation.   lisinopril (ZESTRIL) 10 MG tablet TAKE 1 TABLET BY MOUTH EVERY DAY   metoprolol tartrate (LOPRESSOR) 25 MG tablet TAKE HALF A TABLET BY MOUTH TWICE A DAY   nitroGLYCERIN (NITROSTAT) 0.4 MG SL tablet ONE TABLET UNDER THE TONGUE EVERY 5 MINUTES AS NEEDED FOR CHEST PAIN. MAX 3 DOSES IN 15 MIN   Zinc 50 MG TABS Take 50 mg by mouth daily.   No facility-administered encounter medications on file as of 01/18/2023.    Allergies (verified) Ketoconazole   History: Past Medical History:  Diagnosis Date   Adenomatous polyps    CAD (coronary artery disease)    GERD (gastroesophageal reflux disease)    Hypertension    Impaired fasting glucose    Seasonal allergies    Past Surgical History:  Procedure Laterality Date   COLONOSCOPY  10/03/2005   inflamed hyperplastic polyp/adenomatous poylp, left sided diverticula   COLONOSCOPY  03/03/2012   Procedure: COLONOSCOPY;  Surgeon: RDaneil Dolin MD;  Location: [facility];  Service: Endoscopy;  Laterality: N/A;  10:15   COLONOSCOPY N/A 04/11/2017   one 5 mm polyp hepatic flexure. Sigmoid diverticulosis. Sessile serrated adenoma.   COLONOSCOPY WITH PROPOFOL N/A 08/10/2022   Procedure: COLONOSCOPY WITH PROPOFOL;  Surgeon: Daneil Dolin, MD;  Location: [facility];  Service: Endoscopy;  Laterality: N/A;  7:30AM   CORONARY ANGIOPLASTY WITH STENT PLACEMENT  07/16/2014   "1"   KNEE ARTHROSCOPY Right ~ 1994   KNEE ARTHROSCOPY Left ~ 2001   LEFT AND RIGHT HEART CATHETERIZATION WITH CORONARY ANGIOGRAM N/A 07/16/2014   Procedure: LEFT AND RIGHT HEART CATHETERIZATION WITH CORONARY ANGIOGRAM;  Surgeon: [name], MD;  Location: [facility] CATH LAB;  Service: Cardiovascular;   Laterality: N/A;   POLYPECTOMY  04/11/2017   Procedure: POLYPECTOMY;  Surgeon: Daneil Dolin, MD;  Location: [facility];  Service: Endoscopy;;  colon   POLYPECTOMY  08/10/2022   Procedure: POLYPECTOMY;  Surgeon: Daneil Dolin, MD;  Location: [facility];  Service: Endoscopy;;   Family History  Problem Relation Age of Onset   Liver cancer Father    Cancer Father        liver   Colon cancer Neg Hx    Social History   Socioeconomic History   Marital status: Married    Spouse name: Not on file   Number of children: Not on file   Years of education: Not on file   Highest education level: Not on file  Occupational History   Not on file  Tobacco Use   Smoking status: Never   Smokeless tobacco: Never  Vaping Use   Vaping Use: Never used  Substance and Sexual Activity   Alcohol use: Yes    Alcohol/week: 2.0 standard drinks of alcohol    Types: 2 Glasses of wine per week   Drug use: No   Sexual activity: Yes  Other Topics Concern   Not on file  Social History Narrative   Not on file   Social Determinants of Health   Financial Resource Strain: Low Risk  (01/18/2023)   Overall Financial Resource Strain (CARDIA)    Difficulty of Paying Living Expenses: Not hard at all  Food Insecurity: No Food Insecurity (01/18/2023)   Hunger Vital Sign    Worried About Running Out of Food in the Last Year: Never true    Ran Out of Food in the Last Year: Never true  Transportation Needs: No Transportation Needs (01/18/2023)   PRAPARE - Hydrologist (Medical): No    Lack of Transportation (Non-Medical): No  Physical Activity: Sufficiently Active (01/18/2023)   Exercise Vital Sign    Days of Exercise per Week: 5 days    Minutes of Exercise per Session: 50 min  Stress: No Stress Concern Present (01/18/2023)   [place]    Feeling of Stress : Not at all  Social Connections: [place]  (01/18/2023)   Social Connection and Isolation Panel [NHANES]    Frequency of Communication with Friends and Family: More than three times a week    Frequency of Social Gatherings with Friends and Family: Three times a week    Attends Religious Services: More than 4 times per year    Active Member of Clubs or Organizations: Yes    Attends Archivist Meetings: More than 4 times per year    Marital Status: Married    Tobacco Counseling Counseling given: Not Answered   Clinical  Intake:  Pre-visit preparation completed: Yes  Pain : No/denies pain  Nutritional Risks: None Diabetes: No  How often do you need to have someone help you when you read instructions, pamphlets, or other written materials from your doctor or pharmacy?: 1 - Never  Diabetic?No   Interpreter Needed?: No  Information entered by :: Roger Adonias LPN   Activities of Daily Living    01/18/2023    8:41 AM 01/14/2023   12:06 PM  In your present state of health, do you have any difficulty performing the following activities:  Hearing? 0 0  Vision? 0 0  Difficulty concentrating or making decisions? 0 0  Walking or climbing stairs? 0 0  Dressing or bathing? 0 0  Doing errands, shopping? 0 0  Preparing Food and eating ? N N  Using the Toilet? N N  In the past six months, have you accidently leaked urine? N N  Do you have problems with loss of bowel control? N   Managing your Medications? N N  Managing your Finances? N N  Housekeeping or managing your Housekeeping? N N    Patient Care Team: [name], DO as PCP - General (Family Medicine) Josue Hector, MD as PCP - Cardiology (Cardiology) Gala Romney [name], MD (Gastroenterology)  Indicate any recent Medical Services you may have received from other than [facility] providers in the past year (date may be approximate).     Assessment:   This is a routine wellness examination for [place].  Hearing/Vision screen Hearing Screening - Comments::  Denies hearing difficulties  Vision Screening - Comments:: Wears rx glasses - up to date with routine eye exams with [facility] Dr. Linna Hoff    Dietary issues and exercise activities discussed: Current Exercise Habits: Home exercise routine, Type of exercise: walking;Other - see comments (yardwork), Time (Minutes): 50, Frequency (Times/Week): 5, Weekly Exercise (Minutes/Week): 250, Intensity: Moderate   Goals Addressed             This Visit's Progress    Remain Active and Independent         Depression Screen    01/18/2023    8:40 AM 01/04/2022    9:00 AM 10/23/2021    8:55 AM 01/03/2021    9:41 AM 08/30/2020    8:40 AM 12/29/2018    9:44 AM 12/29/2018    9:40 AM  PHQ 2/9 Scores  PHQ - 2 Score 0 0 0 0 0 0 0    Fall Risk    01/14/2023   12:06 PM 01/04/2022    8:58 AM 12/31/2021    1:59 PM 10/23/2021    8:55 AM 01/03/2021    9:41 AM  Fall Risk   Falls in the past year? 0 0 0 0 0  Number falls in past yr: 0 0 0    Injury with Fall? 0 0 0    Risk for fall due to :  No Fall Risks  No Fall Risks   Follow up  Falls evaluation completed  Falls evaluation completed Falls evaluation completed    [place]:  Any stairs in or around the home? Yes  If so, are there any without handrails? No  Home free of loose throw rugs in walkways, pet beds, electrical cords, etc? Yes  Adequate lighting in your home to reduce risk of falls? Yes   ASSISTIVE DEVICES UTILIZED TO PREVENT FALLS:  Life alert? No  Use of a cane, walker or w/c? No  Grab bars in the bathroom? Yes  Shower chair or bench in shower? No  Elevated toilet seat or a handicapped toilet? Yes   TIMED UP AND GO:  Was the test performed? Yes .  Length of time to ambulate 10 feet: 5 sec.   Gait steady and fast without use of assistive device  Cognitive Function:        01/18/2023    8:41 AM  6CIT Screen  What Year? 0 points  What month? 0 points  What time? 0 points  Count back from 20  0 points  Months in reverse 0 points  Repeat phrase 0 points  Total Score 0 points    Immunizations Immunization History  Administered Date(s) Administered   Fluad Quad(high Dose 65+) 08/30/2020   Influenza, High Dose Seasonal PF 09/02/2018, 09/25/2021   Influenza,inj,Quad PF,6+ Mos 08/25/2015   Influenza-Unspecified 08/21/2013, 09/07/2014, 08/15/2016, 08/01/2017, 09/02/2018, 08/20/2019, 12/06/2019   Moderna SARS-COV2 Booster Vaccination 09/15/2020   Moderna Sars-Covid-2 Vaccination 12/10/2019, 01/11/2020   Pneumococcal Conjugate-13 10/22/2014   Pneumococcal-Unspecified 10/14/2012   Tdap 10/12/2022   Zoster, Live 11/20/2011    TDAP status: Up to date  Flu Vaccine status: Up to date  Pneumococcal vaccine status: Up to date  Covid-19 vaccine status: Information provided on how to obtain vaccines.   Qualifies for Shingles Vaccine? Yes   Zostavax completed No   Shingrix Completed?: No.    Education has been provided regarding the importance of this vaccine. Patient has been advised to call insurance company to determine out of pocket expense if they have not yet received this vaccine. Advised may also receive vaccine at local pharmacy or Health Dept. Verbalized acceptance and understanding.  Screening Tests Health Maintenance  Topic Date Due   Zoster Vaccines- Shingrix (1 of 2) Never done   COVID-19 Vaccine (4 - 2023-24 season) 07/20/2022   Medicare Annual Wellness (AWV)  01/18/2024   COLONOSCOPY (Pts 45-34yr Insurance coverage will need to be confirmed)  08/11/2027   DTaP/Tdap/Td (2 - Td or Tdap) 10/12/2032   INFLUENZA VACCINE  Completed   Hepatitis C Screening  Completed   HPV VACCINES  Aged Out   Pneumonia Vaccine 77 Years old  Discontinued    Health Maintenance  Health Maintenance Due  Topic Date Due   Zoster Vaccines- Shingrix (1 of 2) Never done   COVID-19 Vaccine (4 - 2023-24 season) 07/20/2022    Colorectal cancer screening: Type of screening: Colonoscopy.  Completed 08/10/22. Repeat every 5 years  Lung Cancer Screening: (Low Dose CT Chest recommended if Age 10634-80years, 30 pack-year currently smoking OR have quit w/in 15years.) does not qualify.   Lung Cancer Screening Referral: n/a  Additional Screening:  Hepatitis C Screening: does qualify; Completed 12/24/16  Vision Screening: Recommended annual ophthalmology exams for early detection of glaucoma and other disorders of the eye. Is the patient up to date with their annual eye exam?  Yes  Who is the provider or what is the name of the office in which the patient attends annual eye exams? Dr. CJorja Loa If pt is not established with a provider, would they like to be referred to a provider to establish care? No .   Dental Screening: Recommended annual dental exams for proper oral hygiene  Community Resource Referral / Chronic Care Management: CRR required this visit?  No   CCM required this visit?  No      Plan:     I have personally reviewed and noted the following in the  patient's chart:   Medical and social history Use of alcohol, tobacco or illicit drugs  Current medications and supplements including opioid prescriptions. Patient is not currently taking opioid prescriptions. Functional ability and status Nutritional status Physical activity Advanced directives List of other physicians Hospitalizations, surgeries, and ER visits in previous 12 months Vitals Screenings to include cognitive, depression, and falls Referrals and appointments  In addition, I have reviewed and discussed with patient certain preventive protocols, quality metrics, and best practice recommendations. A written personalized care plan for preventive services as well as general preventive health recommendations were provided to patient.     Roger [name], [place]   [id number]   Nurse Notes: No concerns

## 2023-01-17 NOTE — Patient Instructions (Signed)
Roger [name] , Thank you for taking time to come for your Medicare Wellness Visit. I appreciate your ongoing commitment to your health goals. Please review the following plan we discussed and let me know if I can assist you in the future.   These are the goals we discussed:  Goals      Remain Active and Independent        This is a list of the screening recommended for you and due dates:  Health Maintenance  Topic Date Due   Zoster (Shingles) Vaccine (1 of 2) Never done   COVID-19 Vaccine (4 - 2023-24 season) 07/20/2022   Medicare Annual Wellness Visit  01/18/2024   Colon Cancer Screening  08/11/2027   DTaP/Tdap/Td vaccine (2 - Td or Tdap) 10/12/2032   Flu Shot  Completed   Hepatitis C Screening: USPSTF Recommendation to screen - Ages 18-79 yo.  Completed   HPV Vaccine  Aged Out   Pneumonia Vaccine  Discontinued    Advanced directives: We have a copy of your advanced directives available in your record should your provider ever need to access them.   Conditions/risks identified: Aim for 30 minutes of exercise or brisk walking, 6-8 glasses of water, and 5 servings of fruits and vegetables each day.   Next appointment: Follow up in one year for your annual wellness visit.   HAPPY BIRTHDAY!!!!!!!  Preventive Care 77 Years and Older, Male  Preventive care refers to lifestyle choices and visits with your health care provider that can promote health and wellness. What does preventive care include? A yearly physical exam. This is also called an annual well check. Dental exams once or twice a year. Routine eye exams. Ask your health care provider how often you should have your eyes checked. Personal lifestyle choices, including: Daily care of your teeth and gums. Regular physical activity. Eating a healthy diet. Avoiding tobacco and drug use. Limiting alcohol use. Practicing safe sex. Taking low doses of aspirin every day. Taking vitamin and mineral supplements as recommended by  your health care provider. What happens during an annual well check? The services and screenings done by your health care provider during your annual well check will depend on your age, overall health, lifestyle risk factors, and family history of disease. Counseling  Your health care provider may ask you questions about your: Alcohol use. Tobacco use. Drug use. Emotional well-being. Home and relationship well-being. Sexual activity. Eating habits. History of falls. Memory and ability to understand (cognition). Work and work Statistician. Screening  You may have the following tests or measurements: Height, weight, and BMI. Blood pressure. Lipid and cholesterol levels. These may be checked every 5 years, or more frequently if you are over 24 years old. Skin check. Lung cancer screening. You may have this screening every year starting at age 30 if you have a 30-pack-year history of smoking and currently smoke or have quit within the past 15 years. Fecal occult blood test (FOBT) of the stool. You may have this test every year starting at age 33. Flexible sigmoidoscopy or colonoscopy. You may have a sigmoidoscopy every 5 years or a colonoscopy every 10 years starting at age 74. Prostate cancer screening. Recommendations will vary depending on your family history and other risks. Hepatitis C blood test. Hepatitis B blood test. Sexually transmitted disease (STD) testing. Diabetes screening. This is done by checking your blood sugar (glucose) after you have not eaten for a while (fasting). You may have this done every 1-3 years. Abdominal  aortic aneurysm (AAA) screening. You may need this if you are a current or former smoker. Osteoporosis. You may be screened starting at age 47 if you are at high risk. Talk with your health care provider about your test results, treatment options, and if necessary, the need for more tests. Vaccines  Your health care provider may recommend certain vaccines,  such as: Influenza vaccine. This is recommended every year. Tetanus, diphtheria, and acellular pertussis (Tdap, Td) vaccine. You may need a Td booster every 10 years. Zoster vaccine. You may need this after age 61. Pneumococcal 13-valent conjugate (PCV13) vaccine. One dose is recommended after age 69. Pneumococcal polysaccharide (PPSV23) vaccine. One dose is recommended after age 23. Talk to your health care provider about which screenings and vaccines you need and how often you need them. This information is not intended to replace advice given to you by your health care provider. Make sure you discuss any questions you have with your health care provider. Document Released: 12/02/2015 Document Revised: 07/25/2016 Document Reviewed: 09/06/2015 Elsevier Interactive Patient Education  2017 [place] Prevention in the Home Falls can cause injuries. They can happen to people of all ages. There are many things you can do to make your home safe and to help prevent falls. What can I do on the outside of my home? Regularly fix the edges of walkways and driveways and fix any cracks. Remove anything that might make you trip as you walk through a door, such as a raised step or threshold. Trim any bushes or trees on the path to your home. Use bright outdoor lighting. Clear any walking paths of anything that might make someone trip, such as rocks or tools. Regularly check to see if handrails are loose or broken. Make sure that both sides of any steps have handrails. Any raised decks and porches should have guardrails on the edges. Have any leaves, snow, or ice cleared regularly. Use sand or salt on walking paths during winter. Clean up any spills in your garage right away. This includes oil or grease spills. What can I do in the bathroom? Use night lights. Install grab bars by the toilet and in the tub and shower. Do not use towel bars as grab bars. Use non-skid mats or decals in the tub or  shower. If you need to sit down in the shower, use a plastic, non-slip stool. Keep the floor dry. Clean up any water that spills on the floor as soon as it happens. Remove soap buildup in the tub or shower regularly. Attach bath mats securely with double-sided non-slip rug tape. Do not have throw rugs and other things on the floor that can make you trip. What can I do in the bedroom? Use night lights. Make sure that you have a light by your bed that is easy to reach. Do not use any sheets or blankets that are too big for your bed. They should not hang down onto the floor. Have a firm chair that has side arms. You can use this for support while you get dressed. Do not have throw rugs and other things on the floor that can make you trip. What can I do in the kitchen? Clean up any spills right away. Avoid walking on wet floors. Keep items that you use a lot in easy-to-reach places. If you need to reach something above you, use a strong step stool that has a grab bar. Keep electrical cords out of the way. Do not use  floor polish or wax that makes floors slippery. If you must use wax, use non-skid floor wax. Do not have throw rugs and other things on the floor that can make you trip. What can I do with my stairs? Do not leave any items on the stairs. Make sure that there are handrails on both sides of the stairs and use them. Fix handrails that are broken or loose. Make sure that handrails are as long as the stairways. Check any carpeting to make sure that it is firmly attached to the stairs. Fix any carpet that is loose or worn. Avoid having throw rugs at the top or bottom of the stairs. If you do have throw rugs, attach them to the floor with carpet tape. Make sure that you have a light switch at the top of the stairs and the bottom of the stairs. If you do not have them, ask someone to add them for you. What else can I do to help prevent falls? Wear shoes that: Do not have high heels. Have  rubber bottoms. Are comfortable and fit you well. Are closed at the toe. Do not wear sandals. If you use a stepladder: Make sure that it is fully opened. Do not climb a closed stepladder. Make sure that both sides of the stepladder are locked into place. Ask someone to hold it for you, if possible. Clearly mark and make sure that you can see: Any grab bars or handrails. First and last steps. Where the edge of each step is. Use tools that help you move around (mobility aids) if they are needed. These include: Canes. Walkers. Scooters. Crutches. Turn on the lights when you go into a dark area. Replace any light bulbs as soon as they burn out. Set up your furniture so you have a clear path. Avoid moving your furniture around. If any of your floors are uneven, fix them. If there are any pets around you, be aware of where they are. Review your medicines with your doctor. Some medicines can make you feel dizzy. This can increase your chance of falling. Ask your doctor what other things that you can do to help prevent falls. This information is not intended to replace advice given to you by your health care provider. Make sure you discuss any questions you have with your health care provider. Document Released: 09/01/2009 Document Revised: 04/12/2016 Document Reviewed: 12/10/2014 Elsevier Interactive Patient Education  2017 [employer].

## 2023-01-18 ENCOUNTER — Ambulatory Visit (INDEPENDENT_AMBULATORY_CARE_PROVIDER_SITE_OTHER): Payer: HMO

## 2023-01-18 VITALS — BP 132/68 | Ht 72.0 in | Wt 202.0 lb

## 2023-01-18 DIAGNOSIS — Z125 Encounter for screening for malignant neoplasm of prostate: Secondary | ICD-10-CM

## 2023-01-18 DIAGNOSIS — Z0000 Encounter for general adult medical examination without abnormal findings: Secondary | ICD-10-CM

## 2023-01-20 ENCOUNTER — Other Ambulatory Visit: Payer: Self-pay | Admitting: Cardiovascular Disease

## 2023-01-22 DIAGNOSIS — Z125 Encounter for screening for malignant neoplasm of prostate: Secondary | ICD-10-CM | POA: Diagnosis not present

## 2023-01-23 LAB — PSA: Prostate Specific Ag, Serum: 0.3 ng/mL (ref 0.0–4.0)

## 2023-01-31 DIAGNOSIS — C4441 Basal cell carcinoma of skin of scalp and neck: Secondary | ICD-10-CM | POA: Diagnosis not present

## 2023-01-31 DIAGNOSIS — L570 Actinic keratosis: Secondary | ICD-10-CM | POA: Diagnosis not present

## 2023-01-31 DIAGNOSIS — D225 Melanocytic nevi of trunk: Secondary | ICD-10-CM | POA: Diagnosis not present

## 2023-01-31 DIAGNOSIS — L820 Inflamed seborrheic keratosis: Secondary | ICD-10-CM | POA: Diagnosis not present

## 2023-01-31 DIAGNOSIS — X32XXXD Exposure to sunlight, subsequent encounter: Secondary | ICD-10-CM | POA: Diagnosis not present

## 2023-01-31 DIAGNOSIS — B360 Pityriasis versicolor: Secondary | ICD-10-CM | POA: Diagnosis not present

## 2023-01-31 DIAGNOSIS — Z1283 Encounter for screening for malignant neoplasm of skin: Secondary | ICD-10-CM | POA: Diagnosis not present

## 2023-02-03 ENCOUNTER — Ambulatory Visit
Admission: EM | Admit: 2023-02-03 | Discharge: 2023-02-03 | Disposition: A | Discharge status: Home / Self Care | Payer: PPO

## 2023-02-03 ENCOUNTER — Encounter: Payer: Self-pay | Admitting: Emergency Medicine

## 2023-02-03 DIAGNOSIS — H1132 Conjunctival hemorrhage, left eye: Secondary | ICD-10-CM | POA: Diagnosis not present

## 2023-02-03 NOTE — Discharge Instructions (Addendum)
I have spoken with ophthalmology regarding her symptoms.  Spoke with Dr. Delight Hoh, he advised that symptoms should improve within the next 2 to 3 weeks. May use artificial tears such as Visine or clear eyes while symptoms persist. Avoid rubbing or irritating the eyes while symptoms persist. May use warm or cool compresses as needed for pain or discomfort. You may feel like there is something in your eye while symptoms are present.  This is not unusual. Dr. Tobe Sos advised that you do not need follow-up unless you have concerns.  If so, you can follow-up with his office at [phone] for an appointment. Follow-up as needed.

## 2023-02-03 NOTE — ED Provider Notes (Signed)
[facility]    CSN: [id number] Arrival date & time: 02/03/23  1234      History   Chief Complaint No chief complaint on file.   HPI Roger [name] is a 77 y.o. male.   The history is provided by the patient.   Patient presents for left eye redness and pain.  Patient states symptoms started today after he had something in the left eye.  He states it was in the corner of the left eye.  States he rubbed it, and afterwards, symptoms started.  Patient states that his wife told him "you look like you have bleeding in your eye".  Patient denies visual changes, loss of vision, decreased vision, headache, or drainage.  Patient states that he wears glasses normally.  Patient states that he does take an aspirin daily.  Past Medical History:  Diagnosis Date   Adenomatous polyps    CAD (coronary artery disease)    GERD (gastroesophageal reflux disease)    Hypertension    Impaired fasting glucose    Seasonal allergies     Patient Active Problem List   Diagnosis Date Noted   Osteoarthritis, hand 09/25/2022   Tinea versicolor 07/04/2022   Tinnitus of both ears 10/06/2020   Stage 3 chronic kidney disease ([place]) 03/03/2020   Essential hypertension 07/17/2014   Hyperlipidemia 07/17/2014   CAD- s/p PCI + DES to mid LAD 07/16/14 07/17/2014    Past Surgical History:  Procedure Laterality Date   COLONOSCOPY  10/03/2005   inflamed hyperplastic polyp/adenomatous poylp, left sided diverticula   COLONOSCOPY  03/03/2012   Procedure: COLONOSCOPY;  Surgeon: Daneil Dolin, MD;  Location: [facility];  Service: Endoscopy;  Laterality: N/A;  10:15   COLONOSCOPY N/A 04/11/2017   one 5 mm polyp hepatic flexure. Sigmoid diverticulosis. Sessile serrated adenoma.   COLONOSCOPY WITH PROPOFOL N/A 08/10/2022   Procedure: COLONOSCOPY WITH PROPOFOL;  Surgeon: Daneil Dolin, MD;  Location: [facility];  Service: Endoscopy;  Laterality: N/A;  7:30AM   CORONARY ANGIOPLASTY WITH STENT  PLACEMENT  07/16/2014   "1"   KNEE ARTHROSCOPY Right ~ 1994   KNEE ARTHROSCOPY Left ~ 2001   LEFT AND RIGHT HEART CATHETERIZATION WITH CORONARY ANGIOGRAM N/A 07/16/2014   Procedure: LEFT AND RIGHT HEART CATHETERIZATION WITH CORONARY ANGIOGRAM;  Surgeon: [name], MD;  Location[facility] C[facility];  Service: Cardiovascular;  Laterality: N/A;   POLYPECTOMY  04/11/2017   Procedure: POLYPECTOMY;  Surgeon: Daneil Dolin, MD;  Location: [facility];  Service: Endoscopy;;  colon   POLYPECTOMY  08/10/2022   Procedure: POLYPECTOMY;  Surgeon: Daneil Dolin, MD;  Location: [facility];  Service: Endoscopy;;       Home Medications    Prior to Admission medications   Medication Sig Start Date End Date Taking? Authorizing Provider  acetaminophen (TYLENOL) 500 MG tablet Take 500-1,000 mg by mouth every 6 (six) hours as needed for mild pain or headache.     [provider]  aspirin 81 MG tablet Take 81 mg by mouth daily.    [provider]  atorvastatin (LIPITOR) 40 MG tablet TAKE 1 TABLET BY MOUTH EVERY DAY IN THE EVENING 10/10/22   Thersa Salt G, DO  cetirizine (ZYRTEC) 10 MG tablet Take 10 mg by mouth daily as needed for allergies.    [provider]  cholecalciferol (VITAMIN D3) 25 MCG (1000 UNIT) tablet Take 1,000 Units by mouth daily. 11/30/19   [provider]  diclofenac  Sodium (VOLTAREN) 1 % GEL APPLY 2 G TOPICALLY 4 (FOUR) TIMES DAILY AS NEEDED (PAIN). 10/22/22   [name], DO  famotidine (PEPCID) 40 MG tablet Take 1 tablet (40 mg total) by mouth daily. 12/25/22   [name], DO  ketoconazole (NIZORAL) 2 % cream Apply 1 Application topically daily as needed for irritation. 05/08/22   [provider]  lisinopril (ZESTRIL) 10 MG tablet TAKE 1 TABLET BY MOUTH EVERY DAY 12/27/22   Thersa Salt G, DO  metoprolol tartrate (LOPRESSOR) 25 MG tablet TAKE 1/2 TABLET BY MOUTH TWICE A DAY 01/22/23   Josue Hector, MD  nitroGLYCERIN (NITROSTAT) 0.4 MG  SL tablet ONE TABLET UNDER THE TONGUE EVERY 5 MINUTES AS NEEDED FOR CHEST PAIN. MAX 3 DOSES IN 15 MIN 01/14/23   Josue Hector, MD  Zinc 50 MG TABS Take 50 mg by mouth daily. 11/30/19   [provider]    Family History Family History  Problem Relation Age of Onset   Liver cancer Father    Cancer Father        liver   Colon cancer Neg Hx     Social History Social History   Tobacco Use   Smoking status: Never   Smokeless tobacco: Never  Vaping Use   Vaping Use: Never used  Substance Use Topics   Alcohol use: Yes    Alcohol/week: 2.0 standard drinks of alcohol    Types: 2 Glasses of wine per week   Drug use: No     Allergies   Ketoconazole   Review of Systems Review of Systems Per HPI  Physical Exam Triage Vital Signs ED Triage Vitals  Enc Vitals Group     BP 02/03/23 1307 (!) 161/82     Pulse Rate 02/03/23 1307 62     Resp 02/03/23 1307 16     Temp 02/03/23 1307 97.7 F (36.5 C)     Temp Source 02/03/23 1307 Oral     SpO2 02/03/23 1307 98 %     Weight --      Height --      Head Circumference --      Peak Flow --      Pain Score 02/03/23 1308 4     Pain Loc --      Pain Edu? --      Excl. in [place]? --    No data found.  Updated Vital Signs BP (!) 161/82 (BP Location: Right Arm)   Pulse 62   Temp 97.7 F (36.5 C) (Oral)   Resp 16   SpO2 98%   Visual Acuity Right Eye Distance:   Left Eye Distance:   Bilateral Distance:    Right Eye Near:   Left Eye Near:    Bilateral Near:     Physical Exam Vitals and nursing note reviewed.  Constitutional:      General: He is not in acute distress.    Appearance: Normal appearance.  HENT:     Head: Normocephalic.  Eyes:     General: Lids are normal. Vision grossly intact.        Left eye: No foreign body, discharge or hordeolum.     Extraocular Movements: Extraocular movements intact.     Conjunctiva/sclera:     Left eye: Hemorrhage present.     Pupils: Pupils are equal, round, and reactive  to light.  Neurological:     Mental Status: He is alert.  UC Treatments / Results  Labs (all labs ordered are listed, but only abnormal results are displayed) Labs Reviewed - No data to display  EKG   Radiology No results found.  Procedures Procedures (including critical care time)  Medications Ordered in UC Medications - No data to display  Initial Impression / Assessment and Plan / UC Course  I have reviewed the triage vital signs and the nursing notes.  Pertinent labs & imaging results that were available during my care of the patient were reviewed by me and considered in my medical decision making (see chart for details).  The patient is well-appearing, he is in no acute distress, vital signs are stable.  Discussed the patient's symptoms with supervising physician Dr. Lanny Cramp.  Dr. Lanny Cramp advised to follow-up with ophthalmology on-call to discussed next steps.    Spoke with Dr. Delight Hoh, ophthalmologist.  Discussed patient with Dr. Tobe Sos.  Dr. Donovan Kail advised that because patient does not have any visual changes, there is no indication of a ruptured globe.  Dr. Tobe Sos advised to reassure the patient regarding symptoms.  Patient to use artificial tears, and cool compresses while symptoms persist.  Dr. Tobe Sos did not advise prophylactic antibiotic treatment at this time.  Symptoms should improve within the next 2 weeks.  Dr. Tobe Sos advised to notify patient that he may have a foreign body sensation in the eye until that time.  After Tobe Sos also advised that patient can follow-up in his office if he has concerns, otherwise follow-up is not indicated unless there is worsening of symptoms.  Patient is advised of Dr. Lolly Mustache recommendations and provided continued reassurance.  Patient was given information for Dr. Lolly Mustache office to follow-up as needed.  Patient verbalizes understanding and is in agreement with this plan of care.  All questions were answered.   Patient stable for discharge. Final Clinical Impressions(s) / UC Diagnoses   Final diagnoses:  Subconjunctival hemorrhage of left eye     Discharge Instructions      I have spoken with ophthalmology regarding her symptoms.  Spoke with Dr. Delight Hoh, he advised that symptoms should improve within the next 2 to 3 weeks. May use artificial tears such as Visine or clear eyes while symptoms persist. Avoid rubbing or irritating the eyes while symptoms persist. May use warm or cool compresses as needed for pain or discomfort. You may feel like there is something in your eye while symptoms are present.  This is not unusual. Dr. Tobe Sos advised that you do not need follow-up unless you have concerns.  If so, you can follow-up with his office at [phone] for an appointment. Follow-up as needed.     ED Prescriptions   None    PDMP not reviewed this encounter.   Tish Men, NP 02/03/23 1428

## 2023-02-03 NOTE — ED Triage Notes (Signed)
States he had something in left eye today.  After getting getting it out, eye red with some pain. Denies any vision changes

## 2023-03-18 ENCOUNTER — Telehealth: Payer: Self-pay | Admitting: Cardiovascular Disease

## 2023-03-18 NOTE — Telephone Encounter (Signed)
Calling to verify is the patient has chronic  heart failure. Please advise

## 2023-03-18 NOTE — Telephone Encounter (Signed)
Spoke to [place] with [facility] and verbalized pt does not have CHF. Shanda Bumps voiced understanding. Provided rep with ICD codes for pt's diagnosis on chart for verification of program pt is enrolled in with [facility]. Rep had no further questions or concerns at this time.Marland Kitchen

## 2023-03-18 NOTE — Telephone Encounter (Signed)
Per chart review, I do not see a diagnosis for Chronic Heart Failure. Please advise.

## 2023-03-21 DIAGNOSIS — G5761 Lesion of plantar nerve, right lower limb: Secondary | ICD-10-CM | POA: Diagnosis not present

## 2023-03-21 DIAGNOSIS — M79671 Pain in right foot: Secondary | ICD-10-CM | POA: Diagnosis not present

## 2023-03-21 DIAGNOSIS — G5762 Lesion of plantar nerve, left lower limb: Secondary | ICD-10-CM | POA: Diagnosis not present

## 2023-03-21 DIAGNOSIS — M79672 Pain in left foot: Secondary | ICD-10-CM | POA: Diagnosis not present

## 2023-03-22 ENCOUNTER — Ambulatory Visit (INDEPENDENT_AMBULATORY_CARE_PROVIDER_SITE_OTHER): Payer: PPO | Admitting: Family Medicine

## 2023-03-22 ENCOUNTER — Other Ambulatory Visit: Payer: Self-pay | Admitting: Family Medicine

## 2023-03-22 ENCOUNTER — Ambulatory Visit (HOSPITAL_COMMUNITY)
Admission: RE | Admit: 2023-03-22 | Discharge: 2023-03-22 | Disposition: A | Discharge status: Home / Self Care | Payer: PPO | Source: Ambulatory Visit | Attending: Family Medicine | Admitting: Family Medicine

## 2023-03-22 VITALS — BP 139/70 | HR 66 | Temp 98.1°F | Ht 72.0 in | Wt 205.0 lb

## 2023-03-22 DIAGNOSIS — R059 Cough, unspecified: Secondary | ICD-10-CM | POA: Diagnosis not present

## 2023-03-22 DIAGNOSIS — R051 Acute cough: Secondary | ICD-10-CM | POA: Diagnosis not present

## 2023-03-22 DIAGNOSIS — M542 Cervicalgia: Secondary | ICD-10-CM | POA: Diagnosis not present

## 2023-03-22 DIAGNOSIS — M47812 Spondylosis without myelopathy or radiculopathy, cervical region: Secondary | ICD-10-CM | POA: Diagnosis not present

## 2023-03-22 MED ORDER — PREDNISONE 50 MG PO TABS: 50.0000 mg | ORAL_TABLET | Freq: Every day | ORAL | 0 refills | Status: AC

## 2023-03-22 MED ORDER — AZITHROMYCIN 250 MG PO TABS: ORAL_TABLET | ORAL | 0 refills | Status: AC

## 2023-03-22 MED ORDER — AMOXICILLIN-POT CLAVULANATE 875-125 MG PO TABS: 1.0000 | ORAL_TABLET | Freq: Two times a day (BID) | ORAL | 0 refills | Status: DC

## 2023-03-25 DIAGNOSIS — M542 Cervicalgia: Secondary | ICD-10-CM | POA: Insufficient documentation

## 2023-03-25 DIAGNOSIS — R051 Acute cough: Secondary | ICD-10-CM | POA: Insufficient documentation

## 2023-03-25 NOTE — Assessment & Plan Note (Addendum)
Chest ray obtained.  Independent interpretation: Left lower lobe opacity. Patient treated with prednisone, Augmentin, and azithromycin. Follow-up in 3 to 4 weeks.

## 2023-03-25 NOTE — Progress Notes (Signed)
Subjective:  Patient ID: Roger [name], male    DOB: 24-May-1946  Age: 77 y.o. MRN: [id number]  CC: Chief Complaint  Patient presents with   Cough    X 1 week hacking with slight production    pain in right side of the neck     Feels like electrical shock right shoulder 1 month     HPI:  77 year old male presents for evaluation of the above.  Patient reports a 1 week history of cough.  Mostly dry.  No fever. Reports rattling/wheezing.  Also, patient reports that over the past month he has had pain in the right side of the neck extending to the right shoulder.  Described as a burning sensation.  No recent fall, trauma, injury.  Patient Active Problem List   Diagnosis Date Noted   Neck pain 03/25/2023   Acute cough 03/25/2023   Osteoarthritis, hand 09/25/2022   Tinea versicolor 07/04/2022   Tinnitus of both ears 10/06/2020   Stage 3 chronic kidney disease (HCC) 03/03/2020   Essential hypertension 07/17/2014   Hyperlipidemia 07/17/2014   CAD- s/p PCI + DES to mid LAD 07/16/14 07/17/2014    Social Hx   Social History   Socioeconomic History   Marital status: Married    Spouse name: Not on file   Number of children: Not on file   Years of education: Not on file   Highest education level: Not on file  Occupational History   Not on file  Tobacco Use   Smoking status: Never   Smokeless tobacco: Never  Vaping Use   Vaping Use: Never used  Substance and Sexual Activity   Alcohol use: Yes    Alcohol/week: 2.0 standard drinks of alcohol    Types: 2 Glasses of wine per week   Drug use: No   Sexual activity: Yes  Other Topics Concern   Not on file  Social History Narrative   Not on file   Social Determinants of Health   Financial Resource Strain: Patient Declined (03/21/2023)   Overall Financial Resource Strain (CARDIA)    Difficulty of Paying Living Expenses: Patient declined  Food Insecurity: Patient Declined (03/21/2023)   Hunger Vital Sign    Worried About  Running Out of Food in the Last Year: Patient declined    Ran Out of Food in the Last Year: Patient declined  Transportation Needs: Patient Declined (03/21/2023)   PRAPARE - Administrator, Civil Service (Medical): Patient declined    Lack of Transportation (Non-Medical): Patient declined  Physical Activity: Unknown (03/21/2023)   Exercise Vital Sign    Days of Exercise per Week: Patient declined    Minutes of Exercise per Session: 50 min  Stress: Patient Declined (03/21/2023)   [employer] of Occupational Health - Occupational Stress Questionnaire    Feeling of Stress : Patient declined  Social Connections: Unknown (03/21/2023)   Social Connection and Isolation Panel [NHANES]    Frequency of Communication with Friends and Family: Patient declined    Frequency of Social Gatherings with Friends and Family: Patient declined    Attends Religious Services: Patient declined    Database administrator or Organizations: Patient declined    Attends Engineer, structural: More than 4 times per year    Marital Status: Patient declined    Review of Systems Per HPI  Objective:  BP 139/70   Pulse 66   Temp 98.1 F (36.7 C)   Ht 6' (1.829 m)  Wt 205 lb (93 kg)   SpO2 95%   BMI 27.80 kg/m      03/22/2023    4:10 PM 03/22/2023    3:53 PM 02/03/2023    1:07 PM  BP/Weight  Systolic BP 139 141 161  Diastolic BP 70 69 82  Wt. (Lbs)  205   BMI  27.8 kg/m2     Physical Exam Vitals and nursing note reviewed.  Constitutional:      General: He is not in acute distress.    Appearance: Normal appearance. He is not ill-appearing.  Eyes:     General:        Right eye: No discharge.        Left eye: No discharge.     Conjunctiva/sclera: Conjunctivae normal.  Cardiovascular:     Rate and Rhythm: Normal rate and regular rhythm.  Pulmonary:     Effort: Pulmonary effort is normal.     Comments: Diffuse wheezing. Neurological:     Mental Status: He is alert.  Psychiatric:         Mood and Affect: Mood normal.        Behavior: Behavior normal.     Lab Results  Component Value Date   WBC 7.3 12/26/2022   HGB 15.1 12/26/2022   HCT 44.5 12/26/2022   PLT 228 12/26/2022   GLUCOSE 101 (H) 12/26/2022   CHOL 130 12/26/2022   TRIG 109 12/26/2022   HDL 45 12/26/2022   LDLCALC 65 12/26/2022   ALT 19 12/26/2022   AST 24 12/26/2022   NA 140 12/26/2022   K 4.5 12/26/2022   CL 102 12/26/2022   CREATININE 1.68 (H) 12/26/2022   BUN 15 12/26/2022   CO2 25 12/26/2022   PSA 0.39 10/19/2014   INR 0.98 07/14/2014   HGBA1C 6.1 (H) 12/26/2022     Assessment & Plan:   Problem List Items Addressed This Visit       Other   Acute cough - Primary    Chest ray obtained.  Independent interpretation: Left lower lobe opacity. Patient treated with prednisone, Augmentin, and azithromycin. Follow-up in 3 to 4 weeks.      Relevant Orders   DG Chest 2 View (Completed)   Neck pain    X-ray findings concerning for radiculopathy.  Recommending referral to neurosurgery.      Relevant Orders   DG Cervical Spine Complete (Completed)    Meds ordered this encounter  Medications   predniSONE (DELTASONE) 50 MG tablet    Sig: Take 1 tablet (50 mg total) by mouth daily for 5 days.    Dispense:  5 tablet    Refill:  0    Follow-up:  3-4 weeks  Eirik Schueler Adriana Simas DO [facility]

## 2023-03-25 NOTE — Assessment & Plan Note (Signed)
X-ray findings concerning for radiculopathy.  Recommending referral to neurosurgery.

## 2023-03-28 NOTE — Addendum Note (Signed)
Addended by: Margaretha Sheffield on: 03/28/2023 08:43 AM   Modules accepted: Orders

## 2023-04-06 ENCOUNTER — Other Ambulatory Visit: Payer: Self-pay | Admitting: Family Medicine

## 2023-04-10 ENCOUNTER — Ambulatory Visit (INDEPENDENT_AMBULATORY_CARE_PROVIDER_SITE_OTHER): Payer: PPO | Admitting: Family Medicine

## 2023-04-10 VITALS — BP 125/58 | HR 65 | Temp 98.1°F | Ht 72.0 in | Wt 202.0 lb

## 2023-04-10 DIAGNOSIS — J189 Pneumonia, unspecified organism: Secondary | ICD-10-CM

## 2023-04-10 NOTE — Assessment & Plan Note (Signed)
Has completed treatment.  Doing well.  Repeat x-ray ordered.

## 2023-04-10 NOTE — Progress Notes (Signed)
Subjective:  Patient ID: Roger [name], male    DOB: October 28, 1946  Age: 76 y.o. MRN: [id number]  CC: Chief Complaint  Patient presents with   follow up from pneumonia     Still slight cough productive at times     HPI:  77 year old male with recently diagnosed community-acquired pneumonia presents for follow-up.  Patient states that he has improved.  He has an occasional cough but overall is feeling very well.  He states that he has an upcoming trip to [address].  He needs follow-up imaging to ensure resolution.  No other complaints concerns at this time.  Patient Active Problem List   Diagnosis Date Noted   Community acquired pneumonia of left lower lobe of lung 04/10/2023   Neck pain 03/25/2023   Osteoarthritis, hand 09/25/2022   Tinea versicolor 07/04/2022   Tinnitus of both ears 10/06/2020   Stage 3 chronic kidney disease (HCC) 03/03/2020   Essential hypertension 07/17/2014   Hyperlipidemia 07/17/2014   CAD- s/p PCI + DES to mid LAD 07/16/14 07/17/2014    Social Hx   Social History   Socioeconomic History   Marital status: Married    Spouse name: Not on file   Number of children: Not on file   Years of education: Not on file   Highest education level: Not on file  Occupational History   Not on file  Tobacco Use   Smoking status: Never   Smokeless tobacco: Never  Vaping Use   Vaping Use: Never used  Substance and Sexual Activity   Alcohol use: Yes    Alcohol/week: 2.0 standard drinks of alcohol    Types: 2 Glasses of wine per week   Drug use: No   Sexual activity: Yes  Other Topics Concern   Not on file  Social History Narrative   Not on file   Social Determinants of Health   Financial Resource Strain: Patient Declined (03/21/2023)   Overall Financial Resource Strain (CARDIA)    Difficulty of Paying Living Expenses: Patient declined  Food Insecurity: Patient Declined (03/21/2023)   Hunger Vital Sign    Worried About Running Out of Food in the Last Year:  Patient declined    Ran Out of Food in the Last Year: Patient declined  Transportation Needs: Patient Declined (03/21/2023)   PRAPARE - Administrator, Civil Service (Medical): Patient declined    Lack of Transportation (Non-Medical): Patient declined  Physical Activity: Unknown (03/21/2023)   Exercise Vital Sign    Days of Exercise per Week: Patient declined    Minutes of Exercise per Session: 50 min  Stress: Patient Declined (03/21/2023)   [employer] of Occupational Health - Occupational Stress Questionnaire    Feeling of Stress : Patient declined  Social Connections: Unknown (03/21/2023)   Social Connection and Isolation Panel [NHANES]    Frequency of Communication with Friends and Family: Patient declined    Frequency of Social Gatherings with Friends and Family: Patient declined    Attends Religious Services: Patient declined    Database administrator or Organizations: Patient declined    Attends Engineer, structural: More than 4 times per year    Marital Status: Patient declined    Review of Systems  Constitutional:  Negative for fever.  Respiratory:  Positive for cough. Negative for shortness of breath.    Objective:  BP (!) 125/58   Pulse 65   Temp 98.1 F (36.7 C)   Ht 6' (1.829 m)  Wt 202 lb (91.6 kg)   SpO2 97%   BMI 27.40 kg/m      04/10/2023   10:33 AM 03/22/2023    4:10 PM 03/22/2023    3:53 PM  BP/Weight  Systolic BP 125 139 141  Diastolic BP 58 70 69  Wt. (Lbs) 202  205  BMI 27.4 kg/m2  27.8 kg/m2    Physical Exam Vitals and nursing note reviewed.  Constitutional:      General: He is not in acute distress.    Appearance: Normal appearance.  HENT:     Head: Normocephalic and atraumatic.  Eyes:     General:        Right eye: No discharge.        Left eye: No discharge.     Conjunctiva/sclera: Conjunctivae normal.  Cardiovascular:     Rate and Rhythm: Normal rate and regular rhythm.  Pulmonary:     Effort: Pulmonary effort  is normal.     Breath sounds: Normal breath sounds. No wheezing, rhonchi or rales.  Neurological:     Mental Status: He is alert.  Psychiatric:        Mood and Affect: Mood normal.        Behavior: Behavior normal.     Lab Results  Component Value Date   WBC 7.3 12/26/2022   HGB 15.1 12/26/2022   HCT 44.5 12/26/2022   PLT 228 12/26/2022   GLUCOSE 101 (H) 12/26/2022   CHOL 130 12/26/2022   TRIG 109 12/26/2022   HDL 45 12/26/2022   LDLCALC 65 12/26/2022   ALT 19 12/26/2022   AST 24 12/26/2022   NA 140 12/26/2022   K 4.5 12/26/2022   CL 102 12/26/2022   CREATININE 1.68 (H) 12/26/2022   BUN 15 12/26/2022   CO2 25 12/26/2022   PSA 0.39 10/19/2014   INR 0.98 07/14/2014   HGBA1C 6.1 (H) 12/26/2022     Assessment & Plan:   Problem List Items Addressed This Visit       Respiratory   Community acquired pneumonia of left lower lobe of lung - Primary    Has completed treatment.  Doing well.  Repeat x-ray ordered.      Relevant Orders   DG Chest 2 View    Jewett Mcgann Kingsland DO [facility]

## 2023-04-10 NOTE — Patient Instructions (Signed)
Xray next week.   I will send a message or we will call with the results.  Take care  Dr. Adriana Simas

## 2023-04-11 NOTE — Progress Notes (Signed)
Referring Physician:  Tommie Sams, DO [address] [place],  [place] [address]  Primary Physician:  Tommie Sams, DO  History of Present Illness: 04/16/2023 Mr. Roger [name] has a history of CKD 3, HTN, hyperlipidemia, and CAD with stents.   Saw PCP on 03/22/23 for 2 month history of intermittent right sided neck pain that radiated into his right shoulder. No arm pain. He has some tingling in right shoulder. Pain is worse with turning his head. No numbness or weakness in his arms. No known injury. No previous neck issues.   Given 5 day course of prednisone by PCP on 03/22/23- did not help his neck pain.   He does not smoke.   Bowel/Bladder Dysfunction: none  Conservative measures:  Physical therapy: none Multimodal medical therapy including regular antiinflammatories: prednisone, tylenol, voltaren gel Injections: No epidural steroid injections  Past Surgery: No spinal surgery.   Roger [name] has no symptoms of cervical myelopathy.  The symptoms are causing a significant impact on the patient's life.   Review of Systems:  A 10 point review of systems is negative, except for the pertinent positives and negatives detailed in the HPI.  Past Medical History: Past Medical History:  Diagnosis Date   Adenomatous polyps    CAD (coronary artery disease)    GERD (gastroesophageal reflux disease)    Hypertension    Impaired fasting glucose    Seasonal allergies     Past Surgical History: Past Surgical History:  Procedure Laterality Date   COLONOSCOPY  10/03/2005   inflamed hyperplastic polyp/adenomatous poylp, left sided diverticula   COLONOSCOPY  03/03/2012   Procedure: COLONOSCOPY;  Surgeon: Corbin Ade, MD;  [facility];  Service: Endoscopy;  Laterality: N/A;  10:15   COLONOSCOPY N/A 04/11/2017   one 5 mm polyp hepatic flexure. Sigmoid diverticulosis. Sessile serrated adenoma.   COLONOSCOPY WITH PROPOFOL N/A 08/10/2022   Procedure: COLONOSCOPY  WITH PROPOFOL;  Surgeon: Corbin Ade, MD;  [facility]: [facility];  Service: Endoscopy;  Laterality: N/A;  7:30AM   CORONARY ANGIOPLASTY WITH STENT PLACEMENT  07/16/2014   "1"   KNEE ARTHROSCOPY Right ~ 1994   KNEE ARTHROSCOPY Left ~ 2001   LEFT AND RIGHT HEART CATHETERIZATION WITH CORONARY ANGIOGRAM N/A 07/16/2014   Procedure: LEFT AND RIGHT HEART CATHETERIZATION WITH CORONARY ANGIOGRAM;  Surgeon: Corky Crafts, MD;  Location[facility] LAB;  Service: Cardiovascular;  Laterality: N/A;   POLYPECTOMY  04/11/2017   Procedure: POLYPECTOMY;  Surgeon: Corbin Ade, MD;  [facility];  Service: Endoscopy;;  colon   POLYPECTOMY  08/10/2022   Procedure: POLYPECTOMY;  Surgeon: Corbin Ade, MD;  [facility]: [facility];  Service: Endoscopy;;    Allergies: Allergies as of 04/16/2023 - Review Complete 04/16/2023  Allergen Reaction Noted   Ketoconazole  01/14/2023    Medications: Outpatient Encounter Medications as of 04/16/2023  Medication Sig   acetaminophen (TYLENOL) 500 MG tablet Take 500-1,000 mg by mouth every 6 (six) hours as needed for mild pain or headache.    aspirin 81 MG tablet Take 81 mg by mouth daily.   atorvastatin (LIPITOR) 40 MG tablet TAKE 1 TABLET BY MOUTH EVERY DAY IN THE EVENING   cetirizine (ZYRTEC) 10 MG tablet Take 10 mg by mouth daily as needed for allergies.   cholecalciferol (VITAMIN D3) 25 MCG (1000 UNIT) tablet Take 1,000 Units by mouth daily.   diclofenac Sodium (VOLTAREN) 1 % GEL APPLY 2 G TOPICALLY  4 (FOUR) TIMES DAILY AS NEEDED (PAIN).   famotidine (PEPCID) 40 MG tablet TAKE 1 TABLET BY MOUTH EVERY DAY   ketoconazole (NIZORAL) 2 % cream Apply 1 Application topically daily as needed for irritation.   lisinopril (ZESTRIL) 10 MG tablet TAKE 1 TABLET BY MOUTH EVERY DAY   metoprolol tartrate (LOPRESSOR) 25 MG tablet TAKE 1/2 TABLET BY MOUTH TWICE A DAY   nitroGLYCERIN (NITROSTAT) 0.4 MG SL tablet ONE TABLET UNDER THE TONGUE EVERY 5 MINUTES AS  NEEDED FOR CHEST PAIN. MAX 3 DOSES IN 15 MIN   Zinc 50 MG TABS Take 50 mg by mouth daily.   No facility-administered encounter medications on file as of 04/16/2023.    Social History: Social History   Tobacco Use   Smoking status: Never   Smokeless tobacco: Never  Vaping Use   Vaping Use: Never used  Substance Use Topics   Alcohol use: Yes    Alcohol/week: 2.0 standard drinks of alcohol    Types: 2 Glasses of wine per week   Drug use: No    Family Medical History: Family History  Problem Relation Age of Onset   Liver cancer Father    Cancer Father        liver   Colon cancer Neg Hx     Physical Examination: Vitals:   04/16/23 1354  BP: 130/71    General: Patient is well developed, well nourished, calm, collected, and in no apparent distress. Attention to examination is appropriate.  Respiratory: Patient is breathing without any difficulty.   NEUROLOGICAL:     Awake, alert, oriented to person, place, and time.  Speech is clear and fluent. Fund of knowledge is appropriate.   Cranial Nerves: Pupils equal round and reactive to light.  Facial tone is symmetric.    No posterior cervical tenderness. No tenderness in bilateral trapezial region.   No abnormal lesions on exposed skin.   Strength: Side Biceps Triceps Deltoid Interossei Grip Wrist Ext. Wrist Flex.  R 5 5 5 5 5 5 5   L 5 5 5 5 5 5 5    Side Iliopsoas Quads Hamstring PF DF EHL  R 5 5 5 5 5 5   L 5 5 5 5 5 5    Reflexes are 1+ and symmetric at the biceps, triceps, brachioradialis, patella and achilles.   Hoffman's is absent.  Clonus is not present.   Bilateral upper and lower extremity sensation is intact to light touch.     Gait is normal.    Medical Decision Making  Imaging: Cervical xrays dated 03/22/23:  FINDINGS: Frontal, bilateral oblique, lateral views of the cervical spine are obtained. There is gentle left convex scoliosis centered at the C6 level. Otherwise alignment is anatomic. There are  no acute displaced fractures. Diffuse cervical spondylosis and facet hypertrophy, with spondylosis and symmetrical neural foraminal encroachment noted from C3-4 through C6-7. Prevertebral soft tissues are unremarkable. Airway is patent. Lung apices are clear.   IMPRESSION: 1. Multilevel cervical spondylosis and facet hypertrophy, greatest from C3-4 through C6-7 with mild symmetrical bilateral neural foraminal encroachment. 2. Mild left convex cervical scoliosis. 3. No acute fracture.     Electronically Signed   By: Sharlet Salina M.D.   On: 03/22/2023 16:40  I have personally reviewed the images and agree with the above interpretation.  Assessment and Plan: Mr. Roger [name] is a pleasant 77 y.o. male has 2 month history of intermittent right sided neck pain that radiated into his right shoulder. No arm pain. He has  some tingling in right shoulder.   He has known cervical spondylosis C3-C7. His current pain is intermittent and tolerable.   Treatment options discussed with patient and following plan made:   - Recommend PT for cervical spine. He will think about this and let me know.  - Will message in in 2-3 weeks to check on him and see if he wants to start PT.  - History of LBP and leg pain that improves with stretches. Let me know if this gets worse and can update his imaging.   I spent a total of 25 minutes in face-to-face and non-face-to-face activities related to this patient's care today including review of outside records, review of imaging, review of symptoms, physical exam, discussion of differential diagnosis, discussion of treatment options, and documentation.   Thank you for involving me in the care of this patient.   Drake Leach PA-C Dept. of Neurosurgery

## 2023-04-16 ENCOUNTER — Ambulatory Visit (HOSPITAL_COMMUNITY)
Admission: RE | Admit: 2023-04-16 | Discharge: 2023-04-16 | Disposition: A | Discharge status: Home / Self Care | Payer: PPO | Source: Ambulatory Visit | Attending: Family Medicine | Admitting: Family Medicine

## 2023-04-16 ENCOUNTER — Encounter: Payer: Self-pay | Admitting: Orthopedic Surgery

## 2023-04-16 ENCOUNTER — Ambulatory Visit (INDEPENDENT_AMBULATORY_CARE_PROVIDER_SITE_OTHER): Payer: PPO | Admitting: Orthopedic Surgery

## 2023-04-16 VITALS — BP 130/71 | Ht 73.0 in | Wt 203.0 lb

## 2023-04-16 DIAGNOSIS — M542 Cervicalgia: Secondary | ICD-10-CM | POA: Diagnosis not present

## 2023-04-16 DIAGNOSIS — J189 Pneumonia, unspecified organism: Secondary | ICD-10-CM | POA: Insufficient documentation

## 2023-04-16 DIAGNOSIS — M47812 Spondylosis without myelopathy or radiculopathy, cervical region: Secondary | ICD-10-CM | POA: Diagnosis not present

## 2023-04-16 NOTE — Patient Instructions (Signed)
It was so nice to see you today. Thank you so much for coming in.    Your neck xrays showed some mild wear and tear (arthritis) and I think this may be causing your pain.   I recommend physical therapy for your neck. I will message you in 2-3 weeks and check on you.   If you decide you want to do PT, then let me know.   Have fun on your trip to [address]!  Please do not hesitate to call if you have any questions or concerns. You can also message me in MyChart.    Drake Leach PA-C [phone]

## 2023-04-29 DIAGNOSIS — X32XXXD Exposure to sunlight, subsequent encounter: Secondary | ICD-10-CM | POA: Diagnosis not present

## 2023-04-29 DIAGNOSIS — Z08 Encounter for follow-up examination after completed treatment for malignant neoplasm: Secondary | ICD-10-CM | POA: Diagnosis not present

## 2023-04-29 DIAGNOSIS — B078 Other viral warts: Secondary | ICD-10-CM | POA: Diagnosis not present

## 2023-04-29 DIAGNOSIS — Z85828 Personal history of other malignant neoplasm of skin: Secondary | ICD-10-CM | POA: Diagnosis not present

## 2023-04-29 DIAGNOSIS — B360 Pityriasis versicolor: Secondary | ICD-10-CM | POA: Diagnosis not present

## 2023-04-29 DIAGNOSIS — L570 Actinic keratosis: Secondary | ICD-10-CM | POA: Diagnosis not present

## 2023-05-07 ENCOUNTER — Encounter: Payer: Self-pay | Admitting: Orthopedic Surgery

## 2023-06-09 DIAGNOSIS — M47812 Spondylosis without myelopathy or radiculopathy, cervical region: Secondary | ICD-10-CM

## 2023-07-01 ENCOUNTER — Other Ambulatory Visit: Payer: Self-pay | Admitting: Family Medicine

## 2023-07-08 ENCOUNTER — Ambulatory Visit (HOSPITAL_COMMUNITY): Payer: PPO | Attending: Orthopedic Surgery | Admitting: Physical Therapy

## 2023-07-08 ENCOUNTER — Encounter (HOSPITAL_COMMUNITY): Payer: Self-pay | Admitting: Physical Therapy

## 2023-07-08 ENCOUNTER — Other Ambulatory Visit: Payer: Self-pay

## 2023-07-08 DIAGNOSIS — R29898 Other symptoms and signs involving the musculoskeletal system: Secondary | ICD-10-CM

## 2023-07-08 DIAGNOSIS — M47812 Spondylosis without myelopathy or radiculopathy, cervical region: Secondary | ICD-10-CM | POA: Diagnosis not present

## 2023-07-08 DIAGNOSIS — M6281 Muscle weakness (generalized): Secondary | ICD-10-CM | POA: Diagnosis present

## 2023-07-08 DIAGNOSIS — M542 Cervicalgia: Secondary | ICD-10-CM

## 2023-07-08 NOTE — Therapy (Signed)
OUTPATIENT PHYSICAL THERAPY CERVICAL EVALUATION   Patient Name: Roger [name] MRN: [id number] DOB:04/18/1946, 77 y.o., male Today's Date: 07/08/2023  END OF SESSION:  PT End of Session - 07/08/23 0812     Visit Number 1    Number of Visits 8    Date for PT Re-Evaluation 08/05/23    Authorization Type [employer] (no auth, No VL)    PT Start Time 0815    PT Stop Time 0853    PT Time Calculation (min) 38 min    Activity Tolerance Patient tolerated treatment well    Behavior During Therapy [facility] for tasks assessed/performed             Past Medical History:  Diagnosis Date   Adenomatous polyps    CAD (coronary artery disease)    GERD (gastroesophageal reflux disease)    Hypertension    Impaired fasting glucose    Seasonal allergies    Past Surgical History:  Procedure Laterality Date   COLONOSCOPY  10/03/2005   inflamed hyperplastic polyp/adenomatous poylp, left sided diverticula   COLONOSCOPY  03/03/2012   Procedure: COLONOSCOPY;  Surgeon: Corbin Ade, MD;  [facility];  Service: Endoscopy;  Laterality: N/A;  10:15   COLONOSCOPY N/A 04/11/2017   one 5 mm polyp hepatic flexure. Sigmoid diverticulosis. Sessile serrated adenoma.   COLONOSCOPY WITH PROPOFOL N/A 08/10/2022   Procedure: COLONOSCOPY WITH PROPOFOL;  Surgeon: Corbin Ade, MD;  [facility]: [facility];  Service: Endoscopy;  Laterality: N/A;  7:30AM   CORONARY ANGIOPLASTY WITH STENT PLACEMENT  07/16/2014   "1"   KNEE ARTHROSCOPY Right ~ 1994   KNEE ARTHROSCOPY Left ~ 2001   LEFT AND RIGHT HEART CATHETERIZATION WITH CORONARY ANGIOGRAM N/A 07/16/2014   Procedure: LEFT AND RIGHT HEART CATHETERIZATION WITH CORONARY ANGIOGRAM;  Surgeon: Corky Crafts, MD;  Location: [facility] LAB;  Service: Cardiovascular;  Laterality: N/A;   POLYPECTOMY  04/11/2017   Procedure: POLYPECTOMY;  Surgeon: Corbin Ade, MD;  [facility];  Service: Endoscopy;;  colon   POLYPECTOMY  08/10/2022    Procedure: POLYPECTOMY;  Surgeon: Corbin Ade, MD;  [facility];  Service: Endoscopy;;   Patient Active Problem List   Diagnosis Date Noted   Community acquired pneumonia of left lower lobe of lung 04/10/2023   Neck pain 03/25/2023   Osteoarthritis, hand 09/25/2022   Tinea versicolor 07/04/2022   Tinnitus of both ears 10/06/2020   Stage 3 chronic kidney disease (HCC) 03/03/2020   Essential hypertension 07/17/2014   Hyperlipidemia 07/17/2014   CAD- s/p PCI + DES to mid LAD 07/16/14 07/17/2014    PCP: Everlene Other DO  REFERRING PROVIDER: Drake Leach, PA-C  REFERRING DIAG: [phone] (ICD-10-CM) - Cervical spondylosis  THERAPY DIAG:  Cervicalgia  Muscle weakness (generalized)  Other symptoms and signs involving the musculoskeletal system  Rationale for Evaluation and Treatment: Rehabilitation  ONSET DATE: "long time ago"   SUBJECTIVE:  SUBJECTIVE STATEMENT: Patient states tingling in R shoulder. Catching with turn to left, tight with turn to R. Symptoms began many years ago with insidious onset. No symptoms at rest, notices with movement. Tingling in R UT region, intermittent and very short term when it comes (10-15 seconds, up to a minute) doesn't notice it much through.   PERTINENT HISTORY:  CKD 3, HTN, hyperlipidemia, and CAD with stents.  PAIN:  Are you having pain? No at rest  PRECAUTIONS: None  WEIGHT BEARING RESTRICTIONS: No  FALLS:  Has patient fallen in last 6 months? No  OCCUPATION: Retired  PLOF: Independent  PATIENT GOALS: make neck feel better  OBJECTIVE: (objective measures from initial evaluation unless otherwise dated)  PATIENT SURVEYS:  FOTO 71% function  COGNITION: Overall cognitive status: Within functional limits for tasks  assessed  SENSATION: WFL  POSTURE: rounded shoulders and forward head  PALPATION: Grossly TTP R suboccipitals and paraspinals, hyperactive R paraspinals greater in upper cervical spine; hypomobile R and L UPA in cervical spine   CERVICAL ROM:   Active ROM A/PROM eval  Flexion 0% limited  Extension 25% limited *  Right lateral flexion 50% limited *  Left lateral flexion 75% limited *  Right rotation 0% limited *  Left rotation 25% limited *    (Blank rows = not tested) *=pain/symptoms  UPPER EXTREMITY ROM: WFL for tasks assessed  Active ROM Right eval Left eval  Shoulder flexion    Shoulder extension    Shoulder abduction    Shoulder adduction    Shoulder extension    Shoulder internal rotation    Shoulder external rotation    Elbow flexion    Elbow extension    Wrist flexion    Wrist extension    Wrist ulnar deviation    Wrist radial deviation    Wrist pronation    Wrist supination     (Blank rows = not tested) *=pain/symptoms  UPPER EXTREMITY MMT:  MMT Right eval Left eval  Shoulder flexion 5 5  Shoulder extension    Shoulder abduction 5 5  Shoulder adduction    Shoulder extension    Shoulder internal rotation 5 5  Shoulder external rotation 4+ 4+  Middle trapezius    Lower trapezius    Elbow flexion 5 5  Elbow extension 5 5  Wrist flexion    Wrist extension    Wrist ulnar deviation    Wrist radial deviation    Wrist pronation    Wrist supination    Grip strength     (Blank rows = not tested) *=pain/symptoms     TODAY'S TREATMENT:                                                                                                                              DATE:  07/08/23  Supine cervical retraction 2 x 10  Seated scap retractions 2 x 10  Seated UT stretch 3 x 20 second holds  bilateral   PATIENT EDUCATION:  Education details: Patient educated on exam findings, POC, scope of PT, HEP, and posture. Person educated: Patient Education method:  Explanation, Demonstration, and Handouts Education comprehension: verbalized understanding, returned demonstration, verbal cues required, and tactile cues required  HOME EXERCISE PROGRAM: Access Code: [email or web address] URL: [email or web address]   Date: 07/08/2023 - Supine Chin Tuck  - 3 x daily - 7 x weekly - 2 sets - 10 reps - Seated Scapular Retraction  - 3 x daily - 7 x weekly - 2 sets - 10 reps - Seated Upper Trapezius Stretch  - 3 x daily - 7 x weekly - 3 reps - 20 second hold  ASSESSMENT:  CLINICAL IMPRESSION: Patient a 77 y.o. y.o. male who was seen today for physical therapy evaluation and treatment for neck pain. Patient presents with pain limited deficits in cervical spine strength, ROM, endurance, activity tolerance, posture, and functional mobility with ADL. Patient is having to modify and restrict ADL as indicated by outcome measure score as well as subjective information and objective measures which is affecting overall participation. Patient will benefit from skilled physical therapy in order to improve function and reduce impairment.  OBJECTIVE IMPAIRMENTS: decreased activity tolerance, decreased endurance, decreased mobility, decreased ROM, decreased strength, increased muscle spasms, impaired flexibility, improper body mechanics, postural dysfunction, and pain.   ACTIVITY LIMITATIONS: carrying, lifting, bending, reach over head, hygiene/grooming, and caring for others  PARTICIPATION LIMITATIONS: meal prep, cleaning, laundry, driving, shopping, community activity, and yard work  PERSONAL FACTORS: Time since onset of injury/illness/exacerbation are also affecting patient's functional outcome.   REHAB POTENTIAL: Good  CLINICAL DECISION MAKING: Stable/uncomplicated  EVALUATION COMPLEXITY: Low   GOALS: Goals reviewed with patient? Yes  SHORT TERM GOALS: Target date: 07/22/2023    Patient will be independent with HEP in order to improve functional  outcomes. Baseline: Goal status: INITIAL  2.  Patient will report at least 25% improvement in symptoms for improved quality of life. Baseline:  Goal status: INITIAL    LONG TERM GOALS: Target date: 08/05/2023    Patient will report at least 75% improvement in symptoms for improved quality of life. Baseline:  Goal status: INITIAL  2.  Patient will improve FOTO score by at least 2 points in order to indicate improved tolerance to activity. Baseline: 71% function Goal status: INITIAL  3.  Patient will demonstrate at least 25% improvement in cervical ROM in all restricted planes for improved ability to move head while driving. Baseline: see above Goal status: INITIAL  4.  Patient will be able to return to all activities unrestricted for improved ability to perform household work and participate with family.  Baseline:  Goal status: INITIAL  5.  Patient will demonstrate grade of 5/5 MMT grade in all tested musculature as evidence of improved strength to assist with lifting at home. Baseline: see above Goal status: INITIAL       PLAN:  PT FREQUENCY: 2x/week  PT DURATION: 4 weeks  PLANNED INTERVENTIONS: Therapeutic exercises, Therapeutic activity, Neuromuscular re-education, Balance training, Gait training, Patient/Family education, Joint manipulation, Joint mobilization, Stair training, Orthotic/Fit training, DME instructions, Aquatic Therapy, Dry Needling, Electrical stimulation, Spinal manipulation, Spinal mobilization, Cryotherapy, Moist heat, Compression bandaging, scar mobilization, Splintting, Taping, Traction, Ultrasound, Ionotophoresis 4mg /ml Dexamethasone, and Manual therapy  PLAN FOR NEXT SESSION: f/u with HEP, postural strengthening and possible manual for pain/mobility   Wyman Songster, PT 07/08/2023, 8:57 AM

## 2023-07-11 ENCOUNTER — Ambulatory Visit (HOSPITAL_COMMUNITY): Payer: PPO | Admitting: Physical Therapy

## 2023-07-11 DIAGNOSIS — R29898 Other symptoms and signs involving the musculoskeletal system: Secondary | ICD-10-CM

## 2023-07-11 DIAGNOSIS — M542 Cervicalgia: Secondary | ICD-10-CM | POA: Diagnosis not present

## 2023-07-11 DIAGNOSIS — M6281 Muscle weakness (generalized): Secondary | ICD-10-CM

## 2023-07-11 NOTE — Therapy (Signed)
OUTPATIENT PHYSICAL THERAPY TREATMENT   Patient Name: FRISCO STAIGER MRN: [id number] DOB:13-Jan-1946, 77 y.o., male Today's Date: 07/11/2023  END OF SESSION:  PT End of Session - 07/11/23 1035     Visit Number 2    Number of Visits 8    Date for PT Re-Evaluation 08/05/23    Authorization Type [employer] (no auth, No VL)    PT Start Time [phone]    PT Stop Time 1026    PT Time Calculation (min) 38 min    Activity Tolerance Patient tolerated treatment well    Behavior During Therapy [facility] for tasks assessed/performed              Past Medical History:  Diagnosis Date   Adenomatous polyps    CAD (coronary artery disease)    GERD (gastroesophageal reflux disease)    Hypertension    Impaired fasting glucose    Seasonal allergies    Past Surgical History:  Procedure Laterality Date   COLONOSCOPY  10/03/2005   inflamed hyperplastic polyp/adenomatous poylp, left sided diverticula   COLONOSCOPY  03/03/2012   Procedure: COLONOSCOPY;  Surgeon: Corbin Ade, MD;  Location: [facility];  Service: Endoscopy;  Laterality: N/A;  10:15   COLONOSCOPY N/A 04/11/2017   one 5 mm polyp hepatic flexure. Sigmoid diverticulosis. Sessile serrated adenoma.   COLONOSCOPY WITH PROPOFOL N/A 08/10/2022   Procedure: COLONOSCOPY WITH PROPOFOL;  Surgeon: Corbin Ade, MD;  Location: [facility];  Service: Endoscopy;  Laterality: N/A;  7:30AM   CORONARY ANGIOPLASTY WITH STENT PLACEMENT  07/16/2014   "1"   KNEE ARTHROSCOPY Right ~ 1994   KNEE ARTHROSCOPY Left ~ 2001   LEFT AND RIGHT HEART CATHETERIZATION WITH CORONARY ANGIOGRAM N/A 07/16/2014   Procedure: LEFT AND RIGHT HEART CATHETERIZATION WITH CORONARY ANGIOGRAM;  Surgeon: Corky Crafts, MD;  Location: [facility] CATH LAB;  Service: Cardiovascular;  Laterality: N/A;   POLYPECTOMY  04/11/2017   Procedure: POLYPECTOMY;  Surgeon: Corbin Ade, MD;  [facility]: [facility];  Service: Endoscopy;;  colon   POLYPECTOMY  08/10/2022    Procedure: POLYPECTOMY;  Surgeon: Corbin Ade, MD;  Location: [facility];  Service: Endoscopy;;   Patient Active Problem List   Diagnosis Date Noted   Community acquired pneumonia of left lower lobe of lung 04/10/2023   Neck pain 03/25/2023   Osteoarthritis, hand 09/25/2022   Tinea versicolor 07/04/2022   Tinnitus of both ears 10/06/2020   Stage 3 chronic kidney disease (HCC) 03/03/2020   Essential hypertension 07/17/2014   Hyperlipidemia 07/17/2014   CAD- s/p PCI + DES to mid LAD 07/16/14 07/17/2014    PCP: Everlene Other DO  REFERRING PROVIDER: Drake Leach, PA-C  REFERRING DIAG: [phone] (ICD-10-CM) - Cervical spondylosis  THERAPY DIAG:  Cervicalgia  Other symptoms and signs involving the musculoskeletal system  Muscle weakness (generalized)  Rationale for Evaluation and Treatment: Rehabilitation  ONSET DATE: "long time ago"  greater than 5 years  SUBJECTIVE:  SUBJECTIVE STATEMENT: Pt states no better no worse.  States his pain varies from 0 to 7/10.  The pain is mostly in his Rt cervical paraspinals and goes as quickly as it appears.  Reports compliance with HEP  Evaluation: Patient states tingling in R shoulder. Catching with turn to left, tight with turn to R. Symptoms began many years ago with insidious onset. No symptoms at rest, notices with movement. Tingling in R UT region, intermittent and very short term when it comes (10-15 seconds, up to a minute) doesn't notice it much through.   PERTINENT HISTORY:  CKD 3, HTN, hyperlipidemia, and CAD with stents.  PAIN:  Are you having pain? No at rest  PRECAUTIONS: None  WEIGHT BEARING RESTRICTIONS: No  FALLS:  Has patient fallen in last 6 months? No  OCCUPATION: Retired  PLOF: Independent  PATIENT GOALS: make neck  feel better  OBJECTIVE: (objective measures from initial evaluation unless otherwise dated)  PATIENT SURVEYS:  FOTO 71% function  COGNITION: Overall cognitive status: Within functional limits for tasks assessed  SENSATION: WFL  POSTURE: rounded shoulders and forward head  PALPATION: Grossly TTP R suboccipitals and paraspinals, hyperactive R paraspinals greater in upper cervical spine; hypomobile R and L UPA in cervical spine   CERVICAL ROM:   Active ROM A/PROM eval  Flexion 0% limited  Extension 25% limited *  Right lateral flexion 50% limited *  Left lateral flexion 75% limited *  Right rotation 0% limited *  Left rotation 25% limited *    (Blank rows = not tested) *=pain/symptoms  UPPER EXTREMITY ROM: WFL for tasks assessed   UPPER EXTREMITY MMT:  MMT Right eval Left eval  Shoulder flexion 5 5  Shoulder extension    Shoulder abduction 5 5  Shoulder adduction    Shoulder extension    Shoulder internal rotation 5 5  Shoulder external rotation 4+ 4+  Middle trapezius    Lower trapezius    Elbow flexion 5 5  Elbow extension 5 5  Wrist flexion    Wrist extension    Wrist ulnar deviation    Wrist radial deviation    Wrist pronation    Wrist supination    Grip strength     (Blank rows = not tested) *=pain/symptoms     TODAY'S TREATMENT:                                                                                                                              DATE:  07/11/23 Goal review Seated:  cervical excursions 5X each direction  Cervical retraction 2X10  Scapular retractions 2X10  UT stretch 3X20" bilaterally  Levator scap stretch 3X20" Manual to bil cervical, scap and traps in seated   07/08/23  Supine cervical retraction 2 x 10  Seated scap retractions 2 x 10  Seated UT stretch 3 x 20 second holds bilateral   PATIENT EDUCATION:  Education details: Patient educated on exam findings, POC, scope  of PT, HEP, and posture. Person educated:  Patient Education method: Explanation, Demonstration, and Handouts Education comprehension: verbalized understanding, returned demonstration, verbal cues required, and tactile cues required  HOME EXERCISE PROGRAM: Access Code: [email or web address] URL: [email or web address]   Date: 07/08/2023 - Supine Chin Tuck  - 3 x daily - 7 x weekly - 2 sets - 10 reps - Seated Scapular Retraction  - 3 x daily - 7 x weekly - 2 sets - 10 reps - Seated Upper Trapezius Stretch  - 3 x daily - 7 x weekly - 3 reps - 20 second hold  ASSESSMENT:  CLINICAL IMPRESSION: Reviewed goals and POC moving forward.  PT able to recall HEP.  Added cervical retraction in seated for alternative way of completing.  PT with good form completing this with minimal cues.  Began seated cervical excursions as noted restrictions in most directions.  Manual to bil UT, scaps and levator revealed general tightness but no spasms.  Pt reported overall improvement following with no pain present.  Patient will benefit from skilled physical therapy in order to improve function and reduce impairment.  OBJECTIVE IMPAIRMENTS: decreased activity tolerance, decreased endurance, decreased mobility, decreased ROM, decreased strength, increased muscle spasms, impaired flexibility, improper body mechanics, postural dysfunction, and pain.   ACTIVITY LIMITATIONS: carrying, lifting, bending, reach over head, hygiene/grooming, and caring for others  PARTICIPATION LIMITATIONS: meal prep, cleaning, laundry, driving, shopping, community activity, and yard work  PERSONAL FACTORS: Time since onset of injury/illness/exacerbation are also affecting patient's functional outcome.   REHAB POTENTIAL: Good  CLINICAL DECISION MAKING: Stable/uncomplicated  EVALUATION COMPLEXITY: Low   GOALS: Goals reviewed with patient? Yes  SHORT TERM GOALS: Target date: 07/22/2023    Patient will be independent with HEP in order to improve functional  outcomes. Baseline: Goal status: IN PROGRESS  2.  Patient will report at least 25% improvement in symptoms for improved quality of life. Baseline:  Goal status: IN PROGRESS    LONG TERM GOALS: Target date: 08/05/2023    Patient will report at least 75% improvement in symptoms for improved quality of life. Baseline:  Goal status: IN PROGRESS  2.  Patient will improve FOTO score by at least 2 points in order to indicate improved tolerance to activity. Baseline: 71% function Goal status: IN PROGRESS  3.  Patient will demonstrate at least 25% improvement in cervical ROM in all restricted planes for improved ability to move head while driving. Baseline: see above Goal status: IN PROGRESS  4.  Patient will be able to return to all activities unrestricted for improved ability to perform household work and participate with family.  Baseline:  Goal status: IN PROGRESS  5.  Patient will demonstrate grade of 5/5 MMT grade in all tested musculature as evidence of improved strength to assist with lifting at home. Baseline: see above Goal status: IN PROGRESS       PLAN:  PT FREQUENCY: 2x/week  PT DURATION: 4 weeks  PLANNED INTERVENTIONS: Therapeutic exercises, Therapeutic activity, Neuromuscular re-education, Balance training, Gait training, Patient/Family education, Joint manipulation, Joint mobilization, Stair training, Orthotic/Fit training, DME instructions, Aquatic Therapy, Dry Needling, Electrical stimulation, Spinal manipulation, Spinal mobilization, Cryotherapy, Moist heat, Compression bandaging, scar mobilization, Splintting, Taping, Traction, Ultrasound, Ionotophoresis 4mg /ml Dexamethasone, and Manual therapy  PLAN FOR NEXT SESSION:  continue with postural strengthening, cervical ROM and manual to reduce pain and dysfunction.   Emeline Gins B, PTA 07/11/2023, 11:34 AM

## 2023-07-15 ENCOUNTER — Ambulatory Visit (HOSPITAL_COMMUNITY): Payer: PPO | Admitting: Physical Therapy

## 2023-07-15 DIAGNOSIS — M542 Cervicalgia: Secondary | ICD-10-CM | POA: Diagnosis not present

## 2023-07-15 DIAGNOSIS — R29898 Other symptoms and signs involving the musculoskeletal system: Secondary | ICD-10-CM

## 2023-07-15 DIAGNOSIS — M6281 Muscle weakness (generalized): Secondary | ICD-10-CM

## 2023-07-15 NOTE — Therapy (Signed)
OUTPATIENT PHYSICAL THERAPY TREATMENT   Patient Name: Roger [name] MRN: [id number] DOB:05-02-1946, 77 y.o., male Today's Date: 07/15/2023  END OF SESSION:  PT End of Session - 07/15/23 4098     Visit Number 3    Number of Visits 8    Date for PT Re-Evaluation 08/05/23    Authorization Type [employer] (no auth, No VL)    PT Start Time 0818    PT Stop Time 0856    PT Time Calculation (min) 38 min    Activity Tolerance Patient tolerated treatment well    Behavior During Therapy [facility] for tasks assessed/performed              Past Medical History:  Diagnosis Date   Adenomatous polyps    CAD (coronary artery disease)    GERD (gastroesophageal reflux disease)    Hypertension    Impaired fasting glucose    Seasonal allergies    Past Surgical History:  Procedure Laterality Date   COLONOSCOPY  10/03/2005   inflamed hyperplastic polyp/adenomatous poylp, left sided diverticula   COLONOSCOPY  03/03/2012   Procedure: COLONOSCOPY;  Surgeon: Corbin Ade, MD;  [facility]: [facility];  Service: Endoscopy;  Laterality: N/A;  10:15   COLONOSCOPY N/A 04/11/2017   one 5 mm polyp hepatic flexure. Sigmoid diverticulosis. Sessile serrated adenoma.   COLONOSCOPY WITH PROPOFOL N/A 08/10/2022   Procedure: COLONOSCOPY WITH PROPOFOL;  Surgeon: Corbin Ade, MD;  [facility]: [facility];  Service: Endoscopy;  Laterality: N/A;  7:30AM   CORONARY ANGIOPLASTY WITH STENT PLACEMENT  07/16/2014   "1"   KNEE ARTHROSCOPY Right ~ 1994   KNEE ARTHROSCOPY Left ~ 2001   LEFT AND RIGHT HEART CATHETERIZATION WITH CORONARY ANGIOGRAM N/A 07/16/2014   Procedure: LEFT AND RIGHT HEART CATHETERIZATION WITH CORONARY ANGIOGRAM;  Surgeon: Corky Crafts, MD;  Location: [facility] CATH LAB;  Service: Cardiovascular;  Laterality: N/A;   POLYPECTOMY  04/11/2017   Procedure: POLYPECTOMY;  Surgeon: Corbin Ade, MD;  [facility];  Service: Endoscopy;;  colon   POLYPECTOMY  08/10/2022    Procedure: POLYPECTOMY;  Surgeon: Corbin Ade, MD;  [facility];  Service: Endoscopy;;   Patient Active Problem List   Diagnosis Date Noted   Community acquired pneumonia of left lower lobe of lung 04/10/2023   Neck pain 03/25/2023   Osteoarthritis, hand 09/25/2022   Tinea versicolor 07/04/2022   Tinnitus of both ears 10/06/2020   Stage 3 chronic kidney disease (HCC) 03/03/2020   Essential hypertension 07/17/2014   Hyperlipidemia 07/17/2014   CAD- s/p PCI + DES to mid LAD 07/16/14 07/17/2014    PCP: Everlene Other DO  REFERRING PROVIDER: Drake Leach, PA-C  REFERRING DIAG: [phone] (ICD-10-CM) - Cervical spondylosis  THERAPY DIAG:  Cervicalgia  Other symptoms and signs involving the musculoskeletal system  Muscle weakness (generalized)  Rationale for Evaluation and Treatment: Rehabilitation  ONSET DATE: "long time ago"  greater than 5 years  SUBJECTIVE:  SUBJECTIVE STATEMENT: Pt states no better no worse.  States he got his wife to complete the massage but wasn't as deep as what we done here. Reports compliance with HEP  Evaluation: Patient states tingling in R shoulder. Catching with turn to left, tight with turn to R. Symptoms began many years ago with insidious onset. No symptoms at rest, notices with movement. Tingling in R UT region, intermittent and very short term when it comes (10-15 seconds, up to a minute) doesn't notice it much through.   PERTINENT HISTORY:  CKD 3, HTN, hyperlipidemia, and CAD with stents.  PAIN:  Are you having pain? No at rest  PRECAUTIONS: None  WEIGHT BEARING RESTRICTIONS: No  FALLS:  Has patient fallen in last 6 months? No  OCCUPATION: Retired  PLOF: Independent  PATIENT GOALS: make neck feel better  OBJECTIVE: (objective  measures from initial evaluation unless otherwise dated)  PATIENT SURVEYS:  FOTO 71% function  COGNITION: Overall cognitive status: Within functional limits for tasks assessed  SENSATION: WFL  POSTURE: rounded shoulders and forward head  PALPATION: Grossly TTP R suboccipitals and paraspinals, hyperactive R paraspinals greater in upper cervical spine; hypomobile R and L UPA in cervical spine   CERVICAL ROM:   Active ROM A/PROM eval  Flexion 0% limited  Extension 25% limited *  Right lateral flexion 50% limited *  Left lateral flexion 75% limited *  Right rotation 0% limited *  Left rotation 25% limited *    (Blank rows = not tested) *=pain/symptoms  UPPER EXTREMITY ROM: WFL for tasks assessed   UPPER EXTREMITY MMT:  MMT Right eval Left eval  Shoulder flexion 5 5  Shoulder extension    Shoulder abduction 5 5  Shoulder adduction    Shoulder extension    Shoulder internal rotation 5 5  Shoulder external rotation 4+ 4+  Middle trapezius    Lower trapezius    Elbow flexion 5 5  Elbow extension 5 5  Wrist flexion    Wrist extension    Wrist ulnar deviation    Wrist radial deviation    Wrist pronation    Wrist supination    Grip strength     (Blank rows = not tested) *=pain/symptoms     TODAY'S TREATMENT:                                                                                                                              DATE:  07/15/23 UBE 3' backward level 1 Standing:  RTB scap retraction 10X  RTB Rows 10X  RTB extensions 10X Seated:  cervical excursions 5X each direction  Cervical retraction 2X10  Scapular retractions 2X10  Cervical rotation and lat flexion 10X each  UT stretch 3X20" bilaterally  Levator scap stretch 3X20" Manual to bil cervical, scap and traps in seated Education on self trigger point release (theracane)   07/11/23 Goal review Seated:  cervical excursions 5X each direction  Cervical retraction 2X10  Scapular retractions  2X10  UT stretch 3X20" bilaterally  Levator scap stretch 3X20" Manual to bil cervical, scap and traps in seated   07/08/23  Supine cervical retraction 2 x 10  Seated scap retractions 2 x 10  Seated UT stretch 3 x 20 second holds bilateral   PATIENT EDUCATION:  Education details: Patient educated on exam findings, POC, scope of PT, HEP, and posture. Person educated: Patient Education method: Explanation, Demonstration, and Handouts Education comprehension: verbalized understanding, returned demonstration, verbal cues required, and tactile cues required  HOME EXERCISE PROGRAM: Access Code: [email or web address] URL: [email or web address]   Date: 07/08/2023 - Supine Chin Tuck  - 3 x daily - 7 x weekly - 2 sets - 10 reps - Seated Scapular Retraction  - 3 x daily - 7 x weekly - 2 sets - 10 reps - Seated Upper Trapezius Stretch  - 3 x daily - 7 x weekly - 3 reps - 20 second hold  ASSESSMENT:  CLINICAL IMPRESSION: Began session with UBE and followed up with standing postural strengthening exercises using red theraband.   Continued with cervical exericses in seated with improved ROM visualized in all directions. Manual revealed continued tightness and trigger spot in Rt UT resolved with manual.  Demonstrated spasm release using cane handle and also shown the theracane on [employer] he could also purchase.  Pt does have a massage gun at home and was informed he could use this as well. Pt reported overall improvement following with no pain present.  Patient will benefit from skilled physical therapy in order to improve function and reduce impairment.  OBJECTIVE IMPAIRMENTS: decreased activity tolerance, decreased endurance, decreased mobility, decreased ROM, decreased strength, increased muscle spasms, impaired flexibility, improper body mechanics, postural dysfunction, and pain.   ACTIVITY LIMITATIONS: carrying, lifting, bending, reach over head, hygiene/grooming, and caring for  others  PARTICIPATION LIMITATIONS: meal prep, cleaning, laundry, driving, shopping, community activity, and yard work  PERSONAL FACTORS: Time since onset of injury/illness/exacerbation are also affecting patient's functional outcome.   REHAB POTENTIAL: Good  CLINICAL DECISION MAKING: Stable/uncomplicated  EVALUATION COMPLEXITY: Low   GOALS: Goals reviewed with patient? Yes  SHORT TERM GOALS: Target date: 07/22/2023    Patient will be independent with HEP in order to improve functional outcomes. Baseline: Goal status: IN PROGRESS  2.  Patient will report at least 25% improvement in symptoms for improved quality of life. Baseline:  Goal status: IN PROGRESS    LONG TERM GOALS: Target date: 08/05/2023    Patient will report at least 75% improvement in symptoms for improved quality of life. Baseline:  Goal status: IN PROGRESS  2.  Patient will improve FOTO score by at least 2 points in order to indicate improved tolerance to activity. Baseline: 71% function Goal status: IN PROGRESS  3.  Patient will demonstrate at least 25% improvement in cervical ROM in all restricted planes for improved ability to move head while driving. Baseline: see above Goal status: IN PROGRESS  4.  Patient will be able to return to all activities unrestricted for improved ability to perform household work and participate with family.  Baseline:  Goal status: IN PROGRESS  5.  Patient will demonstrate grade of 5/5 MMT grade in all tested musculature as evidence of improved strength to assist with lifting at home. Baseline: see above Goal status: IN PROGRESS       PLAN:  PT FREQUENCY: 2x/week  PT DURATION: 4 weeks  PLANNED INTERVENTIONS: Therapeutic exercises, Therapeutic activity, Neuromuscular re-education, Balance training, Gait training, Patient/Family  education, Joint manipulation, Joint mobilization, Stair training, Orthotic/Fit training, DME instructions, Aquatic Therapy, Dry  Needling, Electrical stimulation, Spinal manipulation, Spinal mobilization, Cryotherapy, Moist heat, Compression bandaging, scar mobilization, Splintting, Taping, Traction, Ultrasound, Ionotophoresis 4mg /ml Dexamethasone, and Manual therapy  PLAN FOR NEXT SESSION:  continue with postural strengthening, cervical ROM and manual to reduce pain and dysfunction.   Emeline Gins B, PTA 07/15/2023, 10:51 AM

## 2023-07-18 ENCOUNTER — Ambulatory Visit (HOSPITAL_COMMUNITY): Payer: PPO

## 2023-07-18 DIAGNOSIS — M542 Cervicalgia: Secondary | ICD-10-CM

## 2023-07-18 DIAGNOSIS — R29898 Other symptoms and signs involving the musculoskeletal system: Secondary | ICD-10-CM

## 2023-07-18 DIAGNOSIS — M6281 Muscle weakness (generalized): Secondary | ICD-10-CM

## 2023-07-18 NOTE — Therapy (Signed)
OUTPATIENT PHYSICAL THERAPY TREATMENT   Patient Name: Roger [name] MRN: [id number] DOB:06/25/1946, 77 y.o., male Today's Date: 07/18/2023  END OF SESSION:  PT End of Session - 07/18/23 0826     Visit Number 4    Number of Visits 8    Date for PT Re-Evaluation 08/05/23    Authorization Type [employer] (no auth, No VL)    PT Start Time 0815    PT Stop Time 0855    PT Time Calculation (min) 40 min    Activity Tolerance Patient tolerated treatment well    Behavior During Therapy [facility] for tasks assessed/performed             Past Medical History:  Diagnosis Date   Adenomatous polyps    CAD (coronary artery disease)    GERD (gastroesophageal reflux disease)    Hypertension    Impaired fasting glucose    Seasonal allergies    Past Surgical History:  Procedure Laterality Date   COLONOSCOPY  10/03/2005   inflamed hyperplastic polyp/adenomatous poylp, left sided diverticula   COLONOSCOPY  03/03/2012   Procedure: COLONOSCOPY;  Surgeon: Corbin Ade, MD;  [facility];  Service: Endoscopy;  Laterality: N/A;  10:15   COLONOSCOPY N/A 04/11/2017   one 5 mm polyp hepatic flexure. Sigmoid diverticulosis. Sessile serrated adenoma.   COLONOSCOPY WITH PROPOFOL N/A 08/10/2022   Procedure: COLONOSCOPY WITH PROPOFOL;  Surgeon: Corbin Ade, MD;  [facility]: [facility];  Service: Endoscopy;  Laterality: N/A;  7:30AM   CORONARY ANGIOPLASTY WITH STENT PLACEMENT  07/16/2014   "1"   KNEE ARTHROSCOPY Right ~ 1994   KNEE ARTHROSCOPY Left ~ 2001   LEFT AND RIGHT HEART CATHETERIZATION WITH CORONARY ANGIOGRAM N/A 07/16/2014   Procedure: LEFT AND RIGHT HEART CATHETERIZATION WITH CORONARY ANGIOGRAM;  Surgeon: Corky Crafts, MD;  Location: [facility] CATH LAB;  Service: Cardiovascular;  Laterality: N/A;   POLYPECTOMY  04/11/2017   Procedure: POLYPECTOMY;  Surgeon: Corbin Ade, MD;  [facility];  Service: Endoscopy;;  colon   POLYPECTOMY  08/10/2022    Procedure: POLYPECTOMY;  Surgeon: Corbin Ade, MD;  [facility]: [facility];  Service: Endoscopy;;   Patient Active Problem List   Diagnosis Date Noted   Community acquired pneumonia of left lower lobe of lung 04/10/2023   Neck pain 03/25/2023   Osteoarthritis, hand 09/25/2022   Tinea versicolor 07/04/2022   Tinnitus of both ears 10/06/2020   Stage 3 chronic kidney disease (HCC) 03/03/2020   Essential hypertension 07/17/2014   Hyperlipidemia 07/17/2014   CAD- s/p PCI + DES to mid LAD 07/16/14 07/17/2014    PCP: Everlene Other DO  REFERRING PROVIDER: Drake Leach, PA-C  REFERRING DIAG: [phone] (ICD-10-CM) - Cervical spondylosis  THERAPY DIAG:  Cervicalgia  Other symptoms and signs involving the musculoskeletal system  Muscle weakness (generalized)  Rationale for Evaluation and Treatment: Rehabilitation  ONSET DATE: "long time ago"  greater than 5 years  SUBJECTIVE:  SUBJECTIVE STATEMENT: Currently denies pain but reports of pain (3/10) when turning/looking to the R.  Evaluation: Patient states tingling in R shoulder. Catching with turn to left, tight with turn to R. Symptoms began many years ago with insidious onset. No symptoms at rest, notices with movement. Tingling in R UT region, intermittent and very short term when it comes (10-15 seconds, up to a minute) doesn't notice it much through.   PERTINENT HISTORY:  CKD 3, HTN, hyperlipidemia, and CAD with stents.  PAIN:  Are you having pain? No at rest  PRECAUTIONS: None  WEIGHT BEARING RESTRICTIONS: No  FALLS:  Has patient fallen in last 6 months? No  OCCUPATION: Retired  PLOF: Independent  PATIENT GOALS: make neck feel better  OBJECTIVE: (objective measures from initial evaluation unless otherwise dated)  PATIENT  SURVEYS:  FOTO 71% function  COGNITION: Overall cognitive status: Within functional limits for tasks assessed  SENSATION: WFL  POSTURE: rounded shoulders and forward head  PALPATION: Grossly TTP R suboccipitals and paraspinals, hyperactive R paraspinals greater in upper cervical spine; hypomobile R and L UPA in cervical spine   CERVICAL ROM:   Active ROM A/PROM eval  Flexion 0% limited  Extension 25% limited *  Right lateral flexion 50% limited *  Left lateral flexion 75% limited *  Right rotation 0% limited *  Left rotation 25% limited *    (Blank rows = not tested) *=pain/symptoms  UPPER EXTREMITY ROM: WFL for tasks assessed   UPPER EXTREMITY MMT:  MMT Right eval Left eval  Shoulder flexion 5 5  Shoulder extension    Shoulder abduction 5 5  Shoulder adduction    Shoulder extension    Shoulder internal rotation 5 5  Shoulder external rotation 4+ 4+  Middle trapezius    Lower trapezius    Elbow flexion 5 5  Elbow extension 5 5  Wrist flexion    Wrist extension    Wrist ulnar deviation    Wrist radial deviation    Wrist pronation    Wrist supination    Grip strength     (Blank rows = not tested) *=pain/symptoms     TODAY'S TREATMENT:                                                                                                                              DATE:  07/18/23 UBE level 2, 2' forward, 2' backwards (4' total) Supine: STM on B paracervicals and upper trapezius in supine x 8'  Chin tucks x 3" x 10 x 2  Cervical rotation x 3" x 10 on each side Quadruped  Thread the needle x 10 on each side Seated:  Self-SNAGs with a towel on rotation x 3" x 10 on each  Upper trapezius stretch x 30" x 3 on B   07/15/23 UBE 3' backward level 1 Standing:  RTB scap retraction 10X  RTB Rows 10X  RTB extensions 10X Seated:  cervical excursions 5X  each direction  Cervical retraction 2X10  Scapular retractions 2X10  Cervical rotation and lat flexion 10X  each  UT stretch 3X20" bilaterally  Levator scap stretch 3X20" Manual to bil cervical, scap and traps in seated Education on self trigger point release (theracane)   07/11/23 Goal review Seated:  cervical excursions 5X each direction  Cervical retraction 2X10  Scapular retractions 2X10  UT stretch 3X20" bilaterally  Levator scap stretch 3X20" Manual to bil cervical, scap and traps in seated   07/08/23  Supine cervical retraction 2 x 10  Seated scap retractions 2 x 10  Seated UT stretch 3 x 20 second holds bilateral   PATIENT EDUCATION:  Education details: Patient educated on exam findings, POC, scope of PT, HEP, and posture. Person educated: Patient Education method: Explanation, Demonstration, and Handouts Education comprehension: verbalized understanding, returned demonstration, verbal cues required, and tactile cues required  HOME EXERCISE PROGRAM: Access Code: [email or web address] URL: [email or web address] 07/18/23 - Seated Assisted Cervical Rotation with Towel  - 1-2 x daily - 7 x weekly - 1 sets - 10 reps - 3 hold - Quadruped Thoracic Rotation - Reach Under  - 1-2 x daily - 7 x weekly - 2 sets - 10 reps  Date: 07/08/2023 - Supine Chin Tuck  - 3 x daily - 7 x weekly - 2 sets - 10 reps - Seated Scapular Retraction  - 3 x daily - 7 x weekly - 2 sets - 10 reps - Seated Upper Trapezius Stretch  - 3 x daily - 7 x weekly - 3 reps - 20 second hold  ASSESSMENT:  CLINICAL IMPRESSION: Interventions today were geared towards cervical mobility and strengthening. Tolerated all activities without worsening of symptoms. Demonstrated appropriate levels of fatigue. Provided slight amount of cueing to ensure correct execution of activity with good carry-over. To date, skilled PT is required to address the impairments and improve function.   OBJECTIVE IMPAIRMENTS: decreased activity tolerance, decreased endurance, decreased mobility, decreased ROM, decreased strength, increased muscle  spasms, impaired flexibility, improper body mechanics, postural dysfunction, and pain.   ACTIVITY LIMITATIONS: carrying, lifting, bending, reach over head, hygiene/grooming, and caring for others  PARTICIPATION LIMITATIONS: meal prep, cleaning, laundry, driving, shopping, community activity, and yard work  PERSONAL FACTORS: Time since onset of injury/illness/exacerbation are also affecting patient's functional outcome.   REHAB POTENTIAL: Good  CLINICAL DECISION MAKING: Stable/uncomplicated  EVALUATION COMPLEXITY: Low   GOALS: Goals reviewed with patient? Yes  SHORT TERM GOALS: Target date: 07/22/2023    Patient will be independent with HEP in order to improve functional outcomes. Baseline: Goal status: IN PROGRESS  2.  Patient will report at least 25% improvement in symptoms for improved quality of life. Baseline:  Goal status: IN PROGRESS    LONG TERM GOALS: Target date: 08/05/2023    Patient will report at least 75% improvement in symptoms for improved quality of life. Baseline:  Goal status: IN PROGRESS  2.  Patient will improve FOTO score by at least 2 points in order to indicate improved tolerance to activity. Baseline: 71% function Goal status: IN PROGRESS  3.  Patient will demonstrate at least 25% improvement in cervical ROM in all restricted planes for improved ability to move head while driving. Baseline: see above Goal status: IN PROGRESS  4.  Patient will be able to return to all activities unrestricted for improved ability to perform household work and participate with family.  Baseline:  Goal status: IN PROGRESS  5.  Patient will demonstrate grade of 5/5 MMT  grade in all tested musculature as evidence of improved strength to assist with lifting at home. Baseline: see above Goal status: IN PROGRESS       PLAN:  PT FREQUENCY: 2x/week  PT DURATION: 4 weeks  PLANNED INTERVENTIONS: Therapeutic exercises, Therapeutic activity, Neuromuscular  re-education, Balance training, Gait training, Patient/Family education, Joint manipulation, Joint mobilization, Stair training, Orthotic/Fit training, DME instructions, Aquatic Therapy, Dry Needling, Electrical stimulation, Spinal manipulation, Spinal mobilization, Cryotherapy, Moist heat, Compression bandaging, scar mobilization, Splintting, Taping, Traction, Ultrasound, Ionotophoresis 4mg /ml Dexamethasone, and Manual therapy  PLAN FOR NEXT SESSION:  Continue POC and may progress as tolerated with emphasis on postural strengthening, cervical ROM and manual to reduce pain and dysfunction.   Tish Frederickson. Mariaceleste Herrera, PT, DPT, OCS Board-Certified Clinical Specialist in Orthopedic PT PT Compact Privilege # (): [id number] T 07/18/2023, 8:27 AM

## 2023-07-23 ENCOUNTER — Ambulatory Visit (HOSPITAL_COMMUNITY): Payer: PPO | Attending: Orthopedic Surgery

## 2023-07-23 DIAGNOSIS — M6281 Muscle weakness (generalized): Secondary | ICD-10-CM | POA: Diagnosis present

## 2023-07-23 DIAGNOSIS — M542 Cervicalgia: Secondary | ICD-10-CM | POA: Insufficient documentation

## 2023-07-23 DIAGNOSIS — R29898 Other symptoms and signs involving the musculoskeletal system: Secondary | ICD-10-CM | POA: Insufficient documentation

## 2023-07-23 NOTE — Therapy (Signed)
OUTPATIENT PHYSICAL THERAPY TREATMENT   Patient Name: Roger [name] MRN: [id number] DOB:September 20, 1946, 77 y.o., male Today's Date: 07/23/2023  END OF SESSION:  PT End of Session - 07/23/23 0818     Visit Number 5    Number of Visits 8    Date for PT Re-Evaluation 08/05/23    Authorization Type [employer] (no auth, No VL)    PT Start Time 0815    PT Stop Time 0855    PT Time Calculation (min) 40 min    Activity Tolerance Patient tolerated treatment well    Behavior During Therapy [facility] for tasks assessed/performed              Past Medical History:  Diagnosis Date   Adenomatous polyps    CAD (coronary artery disease)    GERD (gastroesophageal reflux disease)    Hypertension    Impaired fasting glucose    Seasonal allergies    Past Surgical History:  Procedure Laterality Date   COLONOSCOPY  10/03/2005   inflamed hyperplastic polyp/adenomatous poylp, left sided diverticula   COLONOSCOPY  03/03/2012   Procedure: COLONOSCOPY;  Surgeon: Corbin Ade, MD;  [facility];  Service: Endoscopy;  Laterality: N/A;  10:15   COLONOSCOPY N/A 04/11/2017   one 5 mm polyp hepatic flexure. Sigmoid diverticulosis. Sessile serrated adenoma.   COLONOSCOPY WITH PROPOFOL N/A 08/10/2022   Procedure: COLONOSCOPY WITH PROPOFOL;  Surgeon: Corbin Ade, MD;  [facility];  Service: Endoscopy;  Laterality: N/A;  7:30AM   CORONARY ANGIOPLASTY WITH STENT PLACEMENT  07/16/2014   "1"   KNEE ARTHROSCOPY Right ~ 1994   KNEE ARTHROSCOPY Left ~ 2001   LEFT AND RIGHT HEART CATHETERIZATION WITH CORONARY ANGIOGRAM N/A 07/16/2014   Procedure: LEFT AND RIGHT HEART CATHETERIZATION WITH CORONARY ANGIOGRAM;  Surgeon: Corky Crafts, MD;  Location: [facility] CATH LAB;  Service: Cardiovascular;  Laterality: N/A;   POLYPECTOMY  04/11/2017   Procedure: POLYPECTOMY;  Surgeon: Corbin Ade, MD;  [facility];  Service: Endoscopy;;  colon   POLYPECTOMY  08/10/2022    Procedure: POLYPECTOMY;  Surgeon: Corbin Ade, MD;  [facility];  Service: Endoscopy;;   Patient Active Problem List   Diagnosis Date Noted   Community acquired pneumonia of left lower lobe of lung 04/10/2023   Neck pain 03/25/2023   Osteoarthritis, hand 09/25/2022   Tinea versicolor 07/04/2022   Tinnitus of both ears 10/06/2020   Stage 3 chronic kidney disease (HCC) 03/03/2020   Essential hypertension 07/17/2014   Hyperlipidemia 07/17/2014   CAD- s/p PCI + DES to mid LAD 07/16/14 07/17/2014    PCP: Everlene Other DO  REFERRING PROVIDER: Drake Leach, PA-C  REFERRING DIAG: [phone] (ICD-10-CM) - Cervical spondylosis  THERAPY DIAG:  Cervicalgia  Other symptoms and signs involving the musculoskeletal system  Muscle weakness (generalized)  Rationale for Evaluation and Treatment: Rehabilitation  ONSET DATE: "long time ago"  greater than 5 years  SUBJECTIVE:  SUBJECTIVE STATEMENT: Currently denies pain. Patient states that he's still having difficulty turning his head to the R not because of pain but because of tightness.  Evaluation: Patient states tingling in R shoulder. Catching with turn to left, tight with turn to R. Symptoms began many years ago with insidious onset. No symptoms at rest, notices with movement. Tingling in R UT region, intermittent and very short term when it comes (10-15 seconds, up to a minute) doesn't notice it much through.   PERTINENT HISTORY:  CKD 3, HTN, hyperlipidemia, and CAD with stents.  PAIN:  Are you having pain? No at rest  PRECAUTIONS: None  WEIGHT BEARING RESTRICTIONS: No  FALLS:  Has patient fallen in last 6 months? No  OCCUPATION: Retired  PLOF: Independent  PATIENT GOALS: make neck feel better  OBJECTIVE: (objective  measures from initial evaluation unless otherwise dated)  PATIENT SURVEYS:  FOTO 71% function  COGNITION: Overall cognitive status: Within functional limits for tasks assessed  SENSATION: WFL  POSTURE: rounded shoulders and forward head  PALPATION: Grossly TTP R suboccipitals and paraspinals, hyperactive R paraspinals greater in upper cervical spine; hypomobile R and L UPA in cervical spine   CERVICAL ROM:   Active ROM A/PROM eval  Flexion 0% limited  Extension 25% limited *  Right lateral flexion 50% limited *  Left lateral flexion 75% limited *  Right rotation 0% limited *  Left rotation 25% limited *    (Blank rows = not tested) *=pain/symptoms  UPPER EXTREMITY ROM: WFL for tasks assessed   UPPER EXTREMITY MMT:  MMT Right eval Left eval  Shoulder flexion 5 5  Shoulder extension    Shoulder abduction 5 5  Shoulder adduction    Shoulder extension    Shoulder internal rotation 5 5  Shoulder external rotation 4+ 4+  Middle trapezius    Lower trapezius    Elbow flexion 5 5  Elbow extension 5 5  Wrist flexion    Wrist extension    Wrist ulnar deviation    Wrist radial deviation    Wrist pronation    Wrist supination    Grip strength     (Blank rows = not tested) *=pain/symptoms     TODAY'S TREATMENT:                                                                                                                              DATE:  07/23/23 UBE level 2, 2' forward, 2' backwards (4' total) Supine: STM on B paracervicals and upper trapezius in supine x 8'  Chin tucks with soft blue ball on occiput x 3" x 10 x 2  Cervical rotation with soft blue ball on occiput x 3" x 10 on each side Seated:  Self-SNAGs with a towel on rotation x 3" x 10 on each  Upper trapezius stretch x 30" x 3 on B  Shoulder rolls ant/post x 10 x 2 each Levator scap stretch x 30"  x 3 on B Pulleys, shoulder abd with cervical rotation x 2' Standing  Thread the needle x 10 on each  side  07/18/23 UBE level 2, 2' forward, 2' backwards (4' total) Supine: STM on B paracervicals and upper trapezius in supine x 8'  Chin tucks x 3" x 10 x 2  Cervical rotation x 3" x 10 on each side Quadruped:  Thread the needle x 10 on each side Seated:  Self-SNAGs with a towel on rotation x 3" x 10 on each  Upper trapezius stretch x 30" x 3 on B    07/15/23 UBE 3' backward level 1 Standing:  RTB scap retraction 10X  RTB Rows 10X  RTB extensions 10X Seated:  cervical excursions 5X each direction  Cervical retraction 2X10  Scapular retractions 2X10  Cervical rotation and lat flexion 10X each  UT stretch 3X20" bilaterally  Levator scap stretch 3X20" Manual to bil cervical, scap and traps in seated Education on self trigger point release (theracane)   07/11/23 Goal review Seated:  cervical excursions 5X each direction  Cervical retraction 2X10  Scapular retractions 2X10  UT stretch 3X20" bilaterally  Levator scap stretch 3X20" Manual to bil cervical, scap and traps in seated   07/08/23  Supine cervical retraction 2 x 10  Seated scap retractions 2 x 10  Seated UT stretch 3 x 20 second holds bilateral   PATIENT EDUCATION:  Education details: Written HEP, updated, provided, and reviewed. Person educated: Patient Education method: Explanation, Demonstration, and Handouts Education comprehension: verbalized understanding, returned demonstration, verbal cues required, and tactile cues required  HOME EXERCISE PROGRAM: Access Code: [email or web address] URL: [email or web address] 07/23/23 - Seated Levator Scapulae Stretch  - 1-2 x daily - 7 x weekly - 3 reps - 30 hold - Shoulder Rolls in Sitting  - 1 x daily - 7 x weekly - 2 sets - 10 reps  07/18/23 - Seated Assisted Cervical Rotation with Towel  - 1-2 x daily - 7 x weekly - 1 sets - 10 reps - 3 hold - Quadruped Thoracic Rotation - Reach Under  - 1-2 x daily - 7 x weekly - 2 sets - 10 reps  Date: 07/08/2023 - Supine Chin  Tuck  - 3 x daily - 7 x weekly - 2 sets - 10 reps - Seated Scapular Retraction  - 3 x daily - 7 x weekly - 2 sets - 10 reps - Seated Upper Trapezius Stretch  - 3 x daily - 7 x weekly - 3 reps - 20 second hold  ASSESSMENT:  CLINICAL IMPRESSION: Interventions today were geared towards cervical mobility and strengthening. Tolerated all activities including progression without worsening of symptoms except when doing cervical rotation to the L in supine. Demonstrated appropriate levels of fatigue. Provided slight amount of cueing to ensure correct execution of activity with good carry-over. To date, skilled PT is required to address the impairments and improve function.   OBJECTIVE IMPAIRMENTS: decreased activity tolerance, decreased endurance, decreased mobility, decreased ROM, decreased strength, increased muscle spasms, impaired flexibility, improper body mechanics, postural dysfunction, and pain.   ACTIVITY LIMITATIONS: carrying, lifting, bending, reach over head, hygiene/grooming, and caring for others  PARTICIPATION LIMITATIONS: meal prep, cleaning, laundry, driving, shopping, community activity, and yard work  PERSONAL FACTORS: Time since onset of injury/illness/exacerbation are also affecting patient's functional outcome.   REHAB POTENTIAL: Good  CLINICAL DECISION MAKING: Stable/uncomplicated  EVALUATION COMPLEXITY: Low   GOALS: Goals reviewed with patient? Yes  SHORT TERM GOALS: Target date: 07/22/2023  Patient will be independent with HEP in order to improve functional outcomes. Baseline: Goal status: IN PROGRESS  2.  Patient will report at least 25% improvement in symptoms for improved quality of life. Baseline:  Goal status: IN PROGRESS    LONG TERM GOALS: Target date: 08/05/2023    Patient will report at least 75% improvement in symptoms for improved quality of life. Baseline:  Goal status: IN PROGRESS  2.  Patient will improve FOTO score by at least 2 points in  order to indicate improved tolerance to activity. Baseline: 71% function Goal status: IN PROGRESS  3.  Patient will demonstrate at least 25% improvement in cervical ROM in all restricted planes for improved ability to move head while driving. Baseline: see above Goal status: IN PROGRESS  4.  Patient will be able to return to all activities unrestricted for improved ability to perform household work and participate with family.  Baseline:  Goal status: IN PROGRESS  5.  Patient will demonstrate grade of 5/5 MMT grade in all tested musculature as evidence of improved strength to assist with lifting at home. Baseline: see above Goal status: IN PROGRESS       PLAN:  PT FREQUENCY: 2x/week  PT DURATION: 4 weeks  PLANNED INTERVENTIONS: Therapeutic exercises, Therapeutic activity, Neuromuscular re-education, Balance training, Gait training, Patient/Family education, Joint manipulation, Joint mobilization, Stair training, Orthotic/Fit training, DME instructions, Aquatic Therapy, Dry Needling, Electrical stimulation, Spinal manipulation, Spinal mobilization, Cryotherapy, Moist heat, Compression bandaging, scar mobilization, Splintting, Taping, Traction, Ultrasound, Ionotophoresis 4mg /ml Dexamethasone, and Manual therapy  PLAN FOR NEXT SESSION:  Continue POC and may progress as tolerated with emphasis on postural strengthening, cervical ROM and manual to reduce pain and dysfunction.   Tish Frederickson. Avonell Lenig, PT, DPT, OCS Board-Certified Clinical Specialist in Orthopedic PT PT Compact Privilege # ([place]): [id number] T 07/23/2023, 8:31 AM

## 2023-07-25 ENCOUNTER — Ambulatory Visit (HOSPITAL_COMMUNITY): Payer: PPO

## 2023-07-25 DIAGNOSIS — R29898 Other symptoms and signs involving the musculoskeletal system: Secondary | ICD-10-CM

## 2023-07-25 DIAGNOSIS — M542 Cervicalgia: Secondary | ICD-10-CM

## 2023-07-25 DIAGNOSIS — M6281 Muscle weakness (generalized): Secondary | ICD-10-CM

## 2023-07-25 NOTE — Therapy (Addendum)
OUTPATIENT PHYSICAL THERAPY TREATMENT   Patient Name: Roger [name] MRN: [id number] DOB:02-25-1946, 77 y.o., male Today's Date: 07/25/2023  END OF SESSION:  PT End of Session - 07/25/23 0833     Visit Number 6    Number of Visits 8    Date for PT Re-Evaluation 08/05/23    Authorization Type [employer] (no auth, No VL)    PT Start Time 0815    PT Stop Time 0855    PT Time Calculation (min) 40 min    Activity Tolerance Patient tolerated treatment well    Behavior During Therapy [facility] for tasks assessed/performed            Past Medical History:  Diagnosis Date   Adenomatous polyps    CAD (coronary artery disease)    GERD (gastroesophageal reflux disease)    Hypertension    Impaired fasting glucose    Seasonal allergies    Past Surgical History:  Procedure Laterality Date   COLONOSCOPY  10/03/2005   inflamed hyperplastic polyp/adenomatous poylp, left sided diverticula   COLONOSCOPY  03/03/2012   Procedure: COLONOSCOPY;  Surgeon: Corbin Ade, MD;  Location: [facility];  Service: Endoscopy;  Laterality: N/A;  10:15   COLONOSCOPY N/A 04/11/2017   one 5 mm polyp hepatic flexure. Sigmoid diverticulosis. Sessile serrated adenoma.   COLONOSCOPY WITH PROPOFOL N/A 08/10/2022   Procedure: COLONOSCOPY WITH PROPOFOL;  Surgeon: Corbin Ade, MD;  Location: [facility];  Service: Endoscopy;  Laterality: N/A;  7:30AM   CORONARY ANGIOPLASTY WITH STENT PLACEMENT  07/16/2014   "1"   KNEE ARTHROSCOPY Right ~ 1994   KNEE ARTHROSCOPY Left ~ 2001   LEFT AND RIGHT HEART CATHETERIZATION WITH CORONARY ANGIOGRAM N/A 07/16/2014   Procedure: LEFT AND RIGHT HEART CATHETERIZATION WITH CORONARY ANGIOGRAM;  Surgeon: Corky Crafts, MD;  Location: [facility] CATH LAB;  Service: Cardiovascular;  Laterality: N/A;   POLYPECTOMY  04/11/2017   Procedure: POLYPECTOMY;  Surgeon: Corbin Ade, MD;  Location: [facility];  Service: Endoscopy;;  colon   POLYPECTOMY  08/10/2022   Procedure:  POLYPECTOMY;  Surgeon: Corbin Ade, MD;  Location: [facility];  Service: Endoscopy;;   Patient Active Problem List   Diagnosis Date Noted   Community acquired pneumonia of left lower lobe of lung 04/10/2023   Neck pain 03/25/2023   Osteoarthritis, hand 09/25/2022   Tinea versicolor 07/04/2022   Tinnitus of both ears 10/06/2020   Stage 3 chronic kidney disease (HCC) 03/03/2020   Essential hypertension 07/17/2014   Hyperlipidemia 07/17/2014   CAD- s/p PCI + DES to mid LAD 07/16/14 07/17/2014    PCP: Everlene Other DO  REFERRING PROVIDER: Drake Leach, PA-C  REFERRING DIAG: [phone] (ICD-10-CM) - Cervical spondylosis  THERAPY DIAG:  Cervicalgia  Other symptoms and signs involving the musculoskeletal system  Muscle weakness (generalized)  Rationale for Evaluation and Treatment: Rehabilitation  ONSET DATE: "long time ago"  greater than 5 years  SUBJECTIVE:  SUBJECTIVE STATEMENT: Currently denies pain but more of stiffness on the R side when he looks up and turns his neck to the R. Patient reports that it's getting better now (difficulty and stiffness 3/10 before, now 2/10)  Evaluation: Patient states tingling in R shoulder. Catching with turn to left, tight with turn to R. Symptoms began many years ago with insidious onset. No symptoms at rest, notices with movement. Tingling in R UT region, intermittent and very short term when it comes (10-15 seconds, up to a minute) doesn't notice it much through.   PERTINENT HISTORY:  CKD 3, HTN, hyperlipidemia, and CAD with stents.  PAIN:  Are you having pain? No at rest  PRECAUTIONS: None  WEIGHT BEARING RESTRICTIONS: No  FALLS:  Has patient fallen in last 6 months? No  OCCUPATION: Retired  PLOF: Independent  PATIENT GOALS: make  neck feel better  OBJECTIVE: (objective measures from initial evaluation unless otherwise dated)  PATIENT SURVEYS:  FOTO 71% function  COGNITION: Overall cognitive status: Within functional limits for tasks assessed  SENSATION: WFL  POSTURE: rounded shoulders and forward head  PALPATION: Grossly TTP R suboccipitals and paraspinals, hyperactive R paraspinals greater in upper cervical spine; hypomobile R and L UPA in cervical spine   CERVICAL ROM:   Active ROM A/PROM eval  Flexion 0% limited  Extension 25% limited *  Right lateral flexion 50% limited *  Left lateral flexion 75% limited *  Right rotation 0% limited *  Left rotation 25% limited *    (Blank rows = not tested) *=pain/symptoms  UPPER EXTREMITY ROM: WFL for tasks assessed   UPPER EXTREMITY MMT:  MMT Right eval Left eval  Shoulder flexion 5 5  Shoulder extension    Shoulder abduction 5 5  Shoulder adduction    Shoulder extension    Shoulder internal rotation 5 5  Shoulder external rotation 4+ 4+  Middle trapezius    Lower trapezius    Elbow flexion 5 5  Elbow extension 5 5  Wrist flexion    Wrist extension    Wrist ulnar deviation    Wrist radial deviation    Wrist pronation    Wrist supination    Grip strength     (Blank rows = not tested) *=pain/symptoms     TODAY'S TREATMENT:                                                                                                                              DATE:  07/25/23 UBE level 3, 2' forward, 2' backwards (4' total) Supine: STM on B paracervicals and upper trapezius in supine x 8'  Chin tucks with soft blue ball on occiput x 3" x 10 x 2  Cervical rotation with soft blue ball on occiput x 3" x 10 x 2 on each side Seated:  Self-SNAGs with a towel on rotation x 3" x 10 x 2 on each Self-SNAGs with a towel on extension x 3"  x 10 x 2 on each  Upper trapezius stretch x 30" x 3 on B Levator scap stretch x 30" x 3 on B Pulleys, shoulder abd with  cervical rotation x 2' Standing:  Thread the needle x 10 on each side  07/23/23 UBE level 2, 2' forward, 2' backwards (4' total) Supine: STM on B paracervicals and upper trapezius in supine x 8'  Chin tucks with soft blue ball on occiput x 3" x 10 x 2  Cervical rotation with soft blue ball on occiput x 3" x 10 on each side Seated:  Self-SNAGs with a towel on rotation x 3" x 10 on each  Upper trapezius stretch x 30" x 3 on B  Shoulder rolls ant/post x 10 x 2 each Levator scap stretch x 30" x 3 on B Pulleys, shoulder abd with cervical rotation x 2' Standing  Thread the needle x 10 on each side  07/18/23 UBE level 2, 2' forward, 2' backwards (4' total) Supine: STM on B paracervicals and upper trapezius in supine x 8'  Chin tucks x 3" x 10 x 2  Cervical rotation x 3" x 10 on each side Quadruped:  Thread the needle x 10 on each side Seated:  Self-SNAGs with a towel on rotation x 3" x 10 on each  Upper trapezius stretch x 30" x 3 on B    07/15/23 UBE 3' backward level 1 Standing:  RTB scap retraction 10X  RTB Rows 10X  RTB extensions 10X Seated:  cervical excursions 5X each direction  Cervical retraction 2X10  Scapular retractions 2X10  Cervical rotation and lat flexion 10X each  UT stretch 3X20" bilaterally  Levator scap stretch 3X20" Manual to bil cervical, scap and traps in seated Education on self trigger point release (theracane)   07/11/23 Goal review Seated:  cervical excursions 5X each direction  Cervical retraction 2X10  Scapular retractions 2X10  UT stretch 3X20" bilaterally  Levator scap stretch 3X20" Manual to bil cervical, scap and traps in seated   07/08/23  Supine cervical retraction 2 x 10  Seated scap retractions 2 x 10  Seated UT stretch 3 x 20 second holds bilateral   PATIENT EDUCATION:  Education details: Written HEP, updated, provided, and reviewed. Person educated: Patient Education method: Explanation, Demonstration, and Handouts Education  comprehension: verbalized understanding, returned demonstration, verbal cues required, and tactile cues required  HOME EXERCISE PROGRAM: Access Code: [email or web address] URL: [email or web address] 07/25/23 - Upper Cervical Extension SNAG with Strap  - 1-2 x daily - 7 x weekly - 2 sets - 10 reps - 3 hold - Seated Assisted Cervical Rotation with Towel  - 1-2 x daily - 7 x weekly - 2 sets - 10 reps - 3 hold  07/23/23 - Seated Levator Scapulae Stretch  - 1-2 x daily - 7 x weekly - 3 reps - 30 hold - Shoulder Rolls in Sitting  - 1 x daily - 7 x weekly - 2 sets - 10 reps  07/18/23 - Seated Assisted Cervical Rotation with Towel  - 1-2 x daily - 7 x weekly - 1 sets - 10 reps - 3 hold - Quadruped Thoracic Rotation - Reach Under  - 1-2 x daily - 7 x weekly - 2 sets - 10 reps  Date: 07/08/2023 - Supine Chin Tuck  - 3 x daily - 7 x weekly - 2 sets - 10 reps - Seated Scapular Retraction  - 3 x daily - 7 x weekly - 2 sets - 10 reps -  Seated Upper Trapezius Stretch  - 3 x daily - 7 x weekly - 3 reps - 20 second hold  ASSESSMENT:  CLINICAL IMPRESSION: Interventions today were geared towards cervical mobility and strengthening. Tolerated all activities including progression without worsening of symptoms including the progression. Demonstrated appropriate levels of fatigue. Provided slight amount of cueing to ensure correct execution of activity with good carry-over. To date, skilled PT is required to address the impairments and improve function.   OBJECTIVE IMPAIRMENTS: decreased activity tolerance, decreased endurance, decreased mobility, decreased ROM, decreased strength, increased muscle spasms, impaired flexibility, improper body mechanics, postural dysfunction, and pain.   ACTIVITY LIMITATIONS: carrying, lifting, bending, reach over head, hygiene/grooming, and caring for others  PARTICIPATION LIMITATIONS: meal prep, cleaning, laundry, driving, shopping, community activity, and yard work  PERSONAL  FACTORS: Time since onset of injury/illness/exacerbation are also affecting patient's functional outcome.   REHAB POTENTIAL: Good  CLINICAL DECISION MAKING: Stable/uncomplicated  EVALUATION COMPLEXITY: Low   GOALS: Goals reviewed with patient? Yes  SHORT TERM GOALS: Target date: 07/22/2023    Patient will be independent with HEP in order to improve functional outcomes. Baseline: Goal status: IN PROGRESS  2.  Patient will report at least 25% improvement in symptoms for improved quality of life. Baseline:  Goal status: IN PROGRESS    LONG TERM GOALS: Target date: 08/05/2023    Patient will report at least 75% improvement in symptoms for improved quality of life. Baseline:  Goal status: IN PROGRESS  2.  Patient will improve FOTO score by at least 2 points in order to indicate improved tolerance to activity. Baseline: 71% function Goal status: IN PROGRESS  3.  Patient will demonstrate at least 25% improvement in cervical ROM in all restricted planes for improved ability to move head while driving. Baseline: see above Goal status: IN PROGRESS  4.  Patient will be able to return to all activities unrestricted for improved ability to perform household work and participate with family.  Baseline:  Goal status: IN PROGRESS  5.  Patient will demonstrate grade of 5/5 MMT grade in all tested musculature as evidence of improved strength to assist with lifting at home. Baseline: see above Goal status: IN PROGRESS       PLAN:  PT FREQUENCY: 2x/week  PT DURATION: 4 weeks  PLANNED INTERVENTIONS: Therapeutic exercises, Therapeutic activity, Neuromuscular re-education, Balance training, Gait training, Patient/Family education, Joint manipulation, Joint mobilization, Stair training, Orthotic/Fit training, DME instructions, Aquatic Therapy, Dry Needling, Electrical stimulation, Spinal manipulation, Spinal mobilization, Cryotherapy, Moist heat, Compression bandaging, scar  mobilization, Splintting, Taping, Traction, Ultrasound, Ionotophoresis 4mg /ml Dexamethasone, and Manual therapy  PLAN FOR NEXT SESSION:  Continue POC and may progress as tolerated with emphasis on postural strengthening, cervical ROM and manual to reduce pain and dysfunction.   Tish Frederickson. Sears Oran, PT, DPT, OCS Board-Certified Clinical Specialist in Orthopedic PT PT Compact Privilege # ([place]): [id number] T 07/25/2023, 8:35 AM

## 2023-07-30 ENCOUNTER — Ambulatory Visit (HOSPITAL_COMMUNITY): Payer: PPO

## 2023-07-30 DIAGNOSIS — M542 Cervicalgia: Secondary | ICD-10-CM

## 2023-07-30 DIAGNOSIS — R29898 Other symptoms and signs involving the musculoskeletal system: Secondary | ICD-10-CM

## 2023-07-30 DIAGNOSIS — M6281 Muscle weakness (generalized): Secondary | ICD-10-CM

## 2023-07-30 NOTE — Therapy (Signed)
OUTPATIENT PHYSICAL THERAPY TREATMENT   Patient Name: Roger [name] MRN: [id number] DOB:08-Aug-1946, 77 y.o., male Today's Date: 07/30/2023  END OF SESSION:  PT End of Session - 07/30/23 0837     Visit Number 7    Number of Visits 8    Date for PT Re-Evaluation 08/05/23    Authorization Type [employer] (no auth, No VL)    PT Start Time 0820    PT Stop Time 0900    PT Time Calculation (min) 40 min    Activity Tolerance Patient tolerated treatment well    Behavior During Therapy [facility] for tasks assessed/performed             Past Medical History:  Diagnosis Date   Adenomatous polyps    CAD (coronary artery disease)    GERD (gastroesophageal reflux disease)    Hypertension    Impaired fasting glucose    Seasonal allergies    Past Surgical History:  Procedure Laterality Date   COLONOSCOPY  10/03/2005   inflamed hyperplastic polyp/adenomatous poylp, left sided diverticula   COLONOSCOPY  03/03/2012   Procedure: COLONOSCOPY;  Surgeon: Corbin Ade, MD;  Location: [facility];  Service: Endoscopy;  Laterality: N/A;  10:15   COLONOSCOPY N/A 04/11/2017   one 5 mm polyp hepatic flexure. Sigmoid diverticulosis. Sessile serrated adenoma.   COLONOSCOPY WITH PROPOFOL N/A 08/10/2022   Procedure: COLONOSCOPY WITH PROPOFOL;  Surgeon: Corbin Ade, MD;  Location: [facility];  Service: Endoscopy;  Laterality: N/A;  7:30AM   CORONARY ANGIOPLASTY WITH STENT PLACEMENT  07/16/2014   "1"   KNEE ARTHROSCOPY Right ~ 1994   KNEE ARTHROSCOPY Left ~ 2001   LEFT AND RIGHT HEART CATHETERIZATION WITH CORONARY ANGIOGRAM N/A 07/16/2014   Procedure: LEFT AND RIGHT HEART CATHETERIZATION WITH CORONARY ANGIOGRAM;  Surgeon: Corky Crafts, MD;  Location: [facility] CATH LAB;  Service: Cardiovascular;  Laterality: N/A;   POLYPECTOMY  04/11/2017   Procedure: POLYPECTOMY;  Surgeon: Corbin Ade, MD;  Location: [facility];  Service: Endoscopy;;  colon   POLYPECTOMY  08/10/2022    Procedure: POLYPECTOMY;  Surgeon: Corbin Ade, MD;  Location: [facility];  Service: Endoscopy;;   Patient Active Problem List   Diagnosis Date Noted   Community acquired pneumonia of left lower lobe of lung 04/10/2023   Neck pain 03/25/2023   Osteoarthritis, hand 09/25/2022   Tinea versicolor 07/04/2022   Tinnitus of both ears 10/06/2020   Stage 3 chronic kidney disease (HCC) 03/03/2020   Essential hypertension 07/17/2014   Hyperlipidemia 07/17/2014   CAD- s/p PCI + DES to mid LAD 07/16/14 07/17/2014    PCP: Everlene Other DO  REFERRING PROVIDER: Drake Leach, PA-C  REFERRING DIAG: [phone] (ICD-10-CM) - Cervical spondylosis  THERAPY DIAG:  Cervicalgia  Other symptoms and signs involving the musculoskeletal system  Muscle weakness (generalized)  Rationale for Evaluation and Treatment: Rehabilitation  ONSET DATE: "long time ago"  greater than 5 years  SUBJECTIVE:  SUBJECTIVE STATEMENT: Patient does not report of any pain today but still reports of stiffness and pain on occasion (cannot associate any aggravating/relieving factors). Patient states that pain is only short term and is rated 4/10 when it happens. Patient is 5 minutes late today. Patient hit his head on the ceiling so he has some bleeding on his scalp.  Evaluation: Patient states tingling in R shoulder. Catching with turn to left, tight with turn to R. Symptoms began many years ago with insidious onset. No symptoms at rest, notices with movement. Tingling in R UT region, intermittent and very short term when it comes (10-15 seconds, up to a minute) doesn't notice it much through.   PERTINENT HISTORY:  CKD 3, HTN, hyperlipidemia, and CAD with stents.  PAIN:  Are you having pain? No at rest  PRECAUTIONS: None  WEIGHT  BEARING RESTRICTIONS: No  FALLS:  Has patient fallen in last 6 months? No  OCCUPATION: Retired  PLOF: Independent  PATIENT GOALS: make neck feel better  OBJECTIVE: (objective measures from initial evaluation unless otherwise dated)  PATIENT SURVEYS:  FOTO 71% function  COGNITION: Overall cognitive status: Within functional limits for tasks assessed  SENSATION: WFL  POSTURE: rounded shoulders and forward head  PALPATION: Grossly TTP R suboccipitals and paraspinals, hyperactive R paraspinals greater in upper cervical spine; hypomobile R and L UPA in cervical spine   CERVICAL ROM:   Active ROM A/PROM eval  Flexion 0% limited  Extension 25% limited *  Right lateral flexion 50% limited *  Left lateral flexion 75% limited *  Right rotation 0% limited *  Left rotation 25% limited *    (Blank rows = not tested) *=pain/symptoms  UPPER EXTREMITY ROM: WFL for tasks assessed   UPPER EXTREMITY MMT:  MMT Right eval Left eval  Shoulder flexion 5 5  Shoulder extension    Shoulder abduction 5 5  Shoulder adduction    Shoulder extension    Shoulder internal rotation 5 5  Shoulder external rotation 4+ 4+  Middle trapezius    Lower trapezius    Elbow flexion 5 5  Elbow extension 5 5  Wrist flexion    Wrist extension    Wrist ulnar deviation    Wrist radial deviation    Wrist pronation    Wrist supination    Grip strength     (Blank rows = not tested) *=pain/symptoms     TODAY'S TREATMENT:                                                                                                                              DATE:  07/30/23 Supine: STM on B paracervicals and upper trapezius in supine x 8'  Chin tucks with soft blue ball on occiput x 3" x 10  Cervical rotation with soft blue ball on occiput x 3" x 10 on each side Seated:  Self-SNAGs with a towel on rotation x 3" x 10 on each Self-SNAGs  with a towel on extension x 3" x 10  Upper trapezius stretch x 30" x 2  on B Levator scap stretch x 30" x 2 on B Pulleys, shoulder abd with cervical rotation x 2' Scapular retraction with ER x RTB x 3" x 10 Standing:  Thread the needle x 10 x 2 on each side  Doorway pec stretch 60 deg abd x 30"   07/25/23 UBE level 3, 2' forward, 2' backwards (4' total) Supine: STM on B paracervicals and upper trapezius in supine x 8'  Chin tucks with soft blue ball on occiput x 3" x 10 x 2  Cervical rotation with soft blue ball on occiput x 3" x 10 x 2 on each side Seated:  Self-SNAGs with a towel on rotation x 3" x 10 x 2 on each Self-SNAGs with a towel on extension x 3" x 10 x 2 on each  Upper trapezius stretch x 30" x 3 on B Levator scap stretch x 30" x 3 on B Pulleys, shoulder abd with cervical rotation x 2' Standing:  Thread the needle x 10 on each side  07/23/23 UBE level 2, 2' forward, 2' backwards (4' total) Supine: STM on B paracervicals and upper trapezius in supine x 8'  Chin tucks with soft blue ball on occiput x 3" x 10 x 2  Cervical rotation with soft blue ball on occiput x 3" x 10 on each side Seated:  Self-SNAGs with a towel on rotation x 3" x 10 on each  Upper trapezius stretch x 30" x 3 on B  Shoulder rolls ant/post x 10 x 2 each Levator scap stretch x 30" x 3 on B Pulleys, shoulder abd with cervical rotation x 2' Standing  Thread the needle x 10 on each side  07/18/23 UBE level 2, 2' forward, 2' backwards (4' total) Supine: STM on B paracervicals and upper trapezius in supine x 8'  Chin tucks x 3" x 10 x 2  Cervical rotation x 3" x 10 on each side Quadruped:  Thread the needle x 10 on each side Seated:  Self-SNAGs with a towel on rotation x 3" x 10 on each  Upper trapezius stretch x 30" x 3 on B    07/15/23 UBE 3' backward level 1 Standing:  RTB scap retraction 10X  RTB Rows 10X  RTB extensions 10X Seated:  cervical excursions 5X each direction  Cervical retraction 2X10  Scapular retractions 2X10  Cervical rotation and lat  flexion 10X each  UT stretch 3X20" bilaterally  Levator scap stretch 3X20" Manual to bil cervical, scap and traps in seated Education on self trigger point release (theracane)   07/11/23 Goal review Seated:  cervical excursions 5X each direction  Cervical retraction 2X10  Scapular retractions 2X10  UT stretch 3X20" bilaterally  Levator scap stretch 3X20" Manual to bil cervical, scap and traps in seated   07/08/23  Supine cervical retraction 2 x 10  Seated scap retractions 2 x 10  Seated UT stretch 3 x 20 second holds bilateral   PATIENT EDUCATION:  Education details: Written HEP, updated, provided, and reviewed. Person educated: Patient Education method: Explanation, Demonstration, and Handouts Education comprehension: verbalized understanding, returned demonstration, verbal cues required, and tactile cues required  HOME EXERCISE PROGRAM: Access Code: [email or web address] URL: [email or web address] 07/30/23 - Doorway Pec Stretch at 60 Degrees Abduction with Arm Straight  - 1-2 x daily - 7 x weekly - 3 reps - 30 hold - Shoulder External Rotation and Scapular Retraction with  Resistance  - 1-2 x daily - 7 x weekly - 2 sets - 10 reps - 3 hold  07/25/23 - Upper Cervical Extension SNAG with Strap  - 1-2 x daily - 7 x weekly - 2 sets - 10 reps - 3 hold - Seated Assisted Cervical Rotation with Towel  - 1-2 x daily - 7 x weekly - 2 sets - 10 reps - 3 hold  07/23/23 - Seated Levator Scapulae Stretch  - 1-2 x daily - 7 x weekly - 3 reps - 30 hold - Shoulder Rolls in Sitting  - 1 x daily - 7 x weekly - 2 sets - 10 reps  07/18/23 - Seated Assisted Cervical Rotation with Towel  - 1-2 x daily - 7 x weekly - 1 sets - 10 reps - 3 hold - Quadruped Thoracic Rotation - Reach Under  - 1-2 x daily - 7 x weekly - 2 sets - 10 reps  Date: 07/08/2023 - Supine Chin Tuck  - 3 x daily - 7 x weekly - 2 sets - 10 reps - Seated Scapular Retraction  - 3 x daily - 7 x weekly - 2 sets - 10 reps - Seated  Upper Trapezius Stretch  - 3 x daily - 7 x weekly - 3 reps - 20 second hold  ASSESSMENT:  CLINICAL IMPRESSION: Interventions today were geared towards cervical mobility and strengthening. Tolerated all activities including progression without worsening of symptoms including the progression. Demonstrated appropriate levels of fatigue. Provided slight amount of cueing to ensure correct execution of activity with good carry-over. To date, skilled PT is required to address the impairments and improve function. Patient may also benefit from massage therapy to further help with the symptoms.   OBJECTIVE IMPAIRMENTS: decreased activity tolerance, decreased endurance, decreased mobility, decreased ROM, decreased strength, increased muscle spasms, impaired flexibility, improper body mechanics, postural dysfunction, and pain.   ACTIVITY LIMITATIONS: carrying, lifting, bending, reach over head, hygiene/grooming, and caring for others  PARTICIPATION LIMITATIONS: meal prep, cleaning, laundry, driving, shopping, community activity, and yard work  PERSONAL FACTORS: Time since onset of injury/illness/exacerbation are also affecting patient's functional outcome.   REHAB POTENTIAL: Good  CLINICAL DECISION MAKING: Stable/uncomplicated  EVALUATION COMPLEXITY: Low   GOALS: Goals reviewed with patient? Yes  SHORT TERM GOALS: Target date: 07/22/2023    Patient will be independent with HEP in order to improve functional outcomes. Baseline: Goal status: IN PROGRESS  2.  Patient will report at least 25% improvement in symptoms for improved quality of life. Baseline:  Goal status: IN PROGRESS    LONG TERM GOALS: Target date: 08/05/2023    Patient will report at least 75% improvement in symptoms for improved quality of life. Baseline:  Goal status: IN PROGRESS  2.  Patient will improve FOTO score by at least 2 points in order to indicate improved tolerance to activity. Baseline: 71% function Goal  status: IN PROGRESS  3.  Patient will demonstrate at least 25% improvement in cervical ROM in all restricted planes for improved ability to move head while driving. Baseline: see above Goal status: IN PROGRESS  4.  Patient will be able to return to all activities unrestricted for improved ability to perform household work and participate with family.  Baseline:  Goal status: IN PROGRESS  5.  Patient will demonstrate grade of 5/5 MMT grade in all tested musculature as evidence of improved strength to assist with lifting at home. Baseline: see above Goal status: IN PROGRESS  PLAN:  PT FREQUENCY: 2x/week  PT DURATION: 4 weeks  PLANNED INTERVENTIONS: Therapeutic exercises, Therapeutic activity, Neuromuscular re-education, Balance training, Gait training, Patient/Family education, Joint manipulation, Joint mobilization, Stair training, Orthotic/Fit training, DME instructions, Aquatic Therapy, Dry Needling, Electrical stimulation, Spinal manipulation, Spinal mobilization, Cryotherapy, Moist heat, Compression bandaging, scar mobilization, Splintting, Taping, Traction, Ultrasound, Ionotophoresis 4mg /ml Dexamethasone, and Manual therapy  PLAN FOR NEXT SESSION:  Continue POC and may progress as tolerated with emphasis on postural strengthening, cervical ROM and manual to reduce pain and dysfunction. Re-assess next visit.   Tish Frederickson. Kirsten Mckone, PT, DPT, OCS Board-Certified Clinical Specialist in Orthopedic PT PT Compact Privilege # ([place]): [id number] T 07/30/2023, 8:38 AM

## 2023-08-01 ENCOUNTER — Ambulatory Visit (HOSPITAL_COMMUNITY): Payer: PPO

## 2023-08-01 DIAGNOSIS — R29898 Other symptoms and signs involving the musculoskeletal system: Secondary | ICD-10-CM

## 2023-08-01 DIAGNOSIS — M542 Cervicalgia: Secondary | ICD-10-CM

## 2023-08-01 DIAGNOSIS — M6281 Muscle weakness (generalized): Secondary | ICD-10-CM

## 2023-08-01 NOTE — Therapy (Signed)
OUTPATIENT PHYSICAL THERAPY DISCHARGE/PROGRESS NOTE   Patient Name: Roger [name] MRN: [id number] DOB:1946/10/29, 77 y.o., male Today's Date: 08/01/2023 PHYSICAL THERAPY DISCHARGE SUMMARY  Visits from Start of Care: 8  Current functional level related to goals / functional outcomes: See below   Remaining deficits: See below   Education / Equipment: See below   Patient agrees to discharge. Patient goals were partially met. Patient is being discharged due to  lsignificant progress and patient's request.   Progress Note Reporting Period 07/08/23 to 08/01/23  See note below for Objective Data and Assessment of Progress/Goals.     END OF SESSION:  PT End of Session - 08/01/23 0914     Visit Number 8    Number of Visits 8    Date for PT Re-Evaluation 08/05/23    Authorization Type [employer] (no auth, No VL)    PT Start Time 0820    PT Stop Time 0900    PT Time Calculation (min) 40 min    Activity Tolerance Patient tolerated treatment well    Behavior During Therapy [facility] for tasks assessed/performed            Past Medical History:  Diagnosis Date   Adenomatous polyps    CAD (coronary artery disease)    GERD (gastroesophageal reflux disease)    Hypertension    Impaired fasting glucose    Seasonal allergies    Past Surgical History:  Procedure Laterality Date   COLONOSCOPY  10/03/2005   inflamed hyperplastic polyp/adenomatous poylp, left sided diverticula   COLONOSCOPY  03/03/2012   Procedure: COLONOSCOPY;  Surgeon: Corbin Ade, MD;  [facility];  Service: Endoscopy;  Laterality: N/A;  10:15   COLONOSCOPY N/A 04/11/2017   one 5 mm polyp hepatic flexure. Sigmoid diverticulosis. Sessile serrated adenoma.   COLONOSCOPY WITH PROPOFOL N/A 08/10/2022   Procedure: COLONOSCOPY WITH PROPOFOL;  Surgeon: Corbin Ade, MD;  [facility]: [facility];  Service: Endoscopy;  Laterality: N/A;  7:30AM   CORONARY ANGIOPLASTY WITH STENT PLACEMENT   07/16/2014   "1"   KNEE ARTHROSCOPY Right ~ 1994   KNEE ARTHROSCOPY Left ~ 2001   LEFT AND RIGHT HEART CATHETERIZATION WITH CORONARY ANGIOGRAM N/A 07/16/2014   Procedure: LEFT AND RIGHT HEART CATHETERIZATION WITH CORONARY ANGIOGRAM;  Surgeon: Corky Crafts, MD;  Location: [facility] CATH LAB;  Service: Cardiovascular;  Laterality: N/A;   POLYPECTOMY  04/11/2017   Procedure: POLYPECTOMY;  Surgeon: Corbin Ade, MD;  [facility];  Service: Endoscopy;;  colon   POLYPECTOMY  08/10/2022   Procedure: POLYPECTOMY;  Surgeon: Corbin Ade, MD;  [facility]: [facility];  Service: Endoscopy;;   Patient Active Problem List   Diagnosis Date Noted   Community acquired pneumonia of left lower lobe of lung 04/10/2023   Neck pain 03/25/2023   Osteoarthritis, hand 09/25/2022   Tinea versicolor 07/04/2022   Tinnitus of both ears 10/06/2020   Stage 3 chronic kidney disease (HCC) 03/03/2020   Essential hypertension 07/17/2014   Hyperlipidemia 07/17/2014   CAD- s/p PCI + DES to mid LAD 07/16/14 07/17/2014    PCP: Everlene Other DO  REFERRING PROVIDER: Drake Leach, PA-C  REFERRING DIAG: [phone] (ICD-10-CM) - Cervical spondylosis  THERAPY DIAG:  Cervicalgia  Other symptoms and signs involving the musculoskeletal system  Muscle weakness (generalized)  Rationale for Evaluation and Treatment: Rehabilitation  ONSET DATE: "long time ago"  greater than 5 years  SUBJECTIVE:  SUBJECTIVE STATEMENT: Patient denies having any pain today. Patient does not notice any difference with his neck condition. Patient still reports that he still has having difficulty turning his neck suddenly turning more to the R and may have pain on occasion (2-3/10). Patient wants to be d/c from PT now as he can just do his  exercises at home.  Evaluation: Patient states tingling in R shoulder. Catching with turn to left, tight with turn to R. Symptoms began many years ago with insidious onset. No symptoms at rest, notices with movement. Tingling in R UT region, intermittent and very short term when it comes (10-15 seconds, up to a minute) doesn't notice it much through.   PERTINENT HISTORY:  CKD 3, HTN, hyperlipidemia, and CAD with stents.  PAIN:  Are you having pain? No at rest  PRECAUTIONS: None  WEIGHT BEARING RESTRICTIONS: No  FALLS:  Has patient fallen in last 6 months? No  OCCUPATION: Retired  PLOF: Independent  PATIENT GOALS: make neck feel better  OBJECTIVE: (objective measures from initial evaluation unless otherwise dated)  PATIENT SURVEYS:  FOTO 72% from 71% function  COGNITION: Overall cognitive status: Within functional limits for tasks assessed  SENSATION: WFL  POSTURE: rounded shoulders and forward head  PALPATION: 08/01/23:Absent TTP R suboccipitals and paraspinals. However, still hyperactive R paraspinals greater in upper cervical spine; still hypomobile R and L UPA in cervical spine   CERVICAL ROM:   Active ROM A/PROM eval AROM 08/01/23  Flexion 0% limited 0% limited  Extension 25% limited * 25% limited  Right lateral flexion 50% limited * 50% limited  Left lateral flexion 75% limited * 75% limited  Right rotation 0% limited * 25% limited  Left rotation 25% limited *  0% limited   (Blank rows = not tested) *=pain/symptoms  UPPER EXTREMITY ROM: WFL for tasks assessed   UPPER EXTREMITY MMT:  MMT Right eval Left eval Right 08/01/23 Left 08/01/23  Shoulder flexion 5 5 5 5   Shoulder extension      Shoulder abduction 5 5 5 5   Shoulder adduction      Shoulder extension      Shoulder internal rotation 5 5 5 5   Shoulder external rotation 4+ 4+ 5 5  Middle trapezius      Lower trapezius      Elbow flexion 5 5 5 5   Elbow extension 5 5 5 5   Wrist flexion       Wrist extension      Wrist ulnar deviation      Wrist radial deviation      Wrist pronation      Wrist supination      Grip strength       (Blank rows = not tested) *=pain/symptoms     TODAY'S TREATMENT:  DATE:  08/01/23 Progress eval (FOTO, ROM, MMT) Supine: STM on B paracervicals and upper trapezius in supine x 8' Seated:  Self-SNAGs with a towel on rotation x 3" x 3 on each Self-SNAGs with a towel on extension x 3" x 5  Upper trapezius stretch x 30" x 2 on B Levator scap stretch x 30" x 2 on B   07/30/23 Supine: STM on B paracervicals and upper trapezius in supine x 8'  Chin tucks with soft blue ball on occiput x 3" x 10  Cervical rotation with soft blue ball on occiput x 3" x 10 on each side Seated:  Self-SNAGs with a towel on rotation x 3" x 10 on each Self-SNAGs with a towel on extension x 3" x 10  Upper trapezius stretch x 30" x 2 on B Levator scap stretch x 30" x 2 on B Pulleys, shoulder abd with cervical rotation x 2' Scapular retraction with ER x RTB x 3" x 10 Standing:  Thread the needle x 10 x 2 on each side  Doorway pec stretch 60 deg abd x 30"   07/25/23 UBE level 3, 2' forward, 2' backwards (4' total) Supine: STM on B paracervicals and upper trapezius in supine x 8'  Chin tucks with soft blue ball on occiput x 3" x 10 x 2  Cervical rotation with soft blue ball on occiput x 3" x 10 x 2 on each side Seated:  Self-SNAGs with a towel on rotation x 3" x 10 x 2 on each Self-SNAGs with a towel on extension x 3" x 10 x 2 on each  Upper trapezius stretch x 30" x 3 on B Levator scap stretch x 30" x 3 on B Pulleys, shoulder abd with cervical rotation x 2' Standing:  Thread the needle x 10 on each side  07/23/23 UBE level 2, 2' forward, 2' backwards (4' total) Supine: STM on B paracervicals and upper trapezius in supine x 8'  Chin tucks  with soft blue ball on occiput x 3" x 10 x 2  Cervical rotation with soft blue ball on occiput x 3" x 10 on each side Seated:  Self-SNAGs with a towel on rotation x 3" x 10 on each  Upper trapezius stretch x 30" x 3 on B  Shoulder rolls ant/post x 10 x 2 each Levator scap stretch x 30" x 3 on B Pulleys, shoulder abd with cervical rotation x 2' Standing  Thread the needle x 10 on each side  07/18/23 UBE level 2, 2' forward, 2' backwards (4' total) Supine: STM on B paracervicals and upper trapezius in supine x 8'  Chin tucks x 3" x 10 x 2  Cervical rotation x 3" x 10 on each side Quadruped:  Thread the needle x 10 on each side Seated:  Self-SNAGs with a towel on rotation x 3" x 10 on each  Upper trapezius stretch x 30" x 3 on B    07/15/23 UBE 3' backward level 1 Standing:  RTB scap retraction 10X  RTB Rows 10X  RTB extensions 10X Seated:  cervical excursions 5X each direction  Cervical retraction 2X10  Scapular retractions 2X10  Cervical rotation and lat flexion 10X each  UT stretch 3X20" bilaterally  Levator scap stretch 3X20" Manual to bil cervical, scap and traps in seated Education on self trigger point release (theracane)   07/11/23 Goal review Seated:  cervical excursions 5X each direction  Cervical retraction 2X10  Scapular retractions 2X10  UT stretch 3X20"  bilaterally  Levator scap stretch 3X20" Manual to bil cervical, scap and traps in seated   07/08/23  Supine cervical retraction 2 x 10  Seated scap retractions 2 x 10  Seated UT stretch 3 x 20 second holds bilateral   PATIENT EDUCATION:  Education details: Reviewed HEP stretches and SNAGs (08/01/23) Person educated: Patient Education method: Programmer, multimedia, Demonstration, and Handouts Education comprehension: verbalized understanding, returned demonstration, verbal cues required, and tactile cues required  HOME EXERCISE PROGRAM: Access Code: [email or web address] URL: [email or web address] 07/30/23 -  Doorway Pec Stretch at 60 Degrees Abduction with Arm Straight  - 1-2 x daily - 7 x weekly - 3 reps - 30 hold - Shoulder External Rotation and Scapular Retraction with Resistance  - 1-2 x daily - 7 x weekly - 2 sets - 10 reps - 3 hold  07/25/23 - Upper Cervical Extension SNAG with Strap  - 1-2 x daily - 7 x weekly - 2 sets - 10 reps - 3 hold - Seated Assisted Cervical Rotation with Towel  - 1-2 x daily - 7 x weekly - 2 sets - 10 reps - 3 hold  07/23/23 - Seated Levator Scapulae Stretch  - 1-2 x daily - 7 x weekly - 3 reps - 30 hold - Shoulder Rolls in Sitting  - 1 x daily - 7 x weekly - 2 sets - 10 reps  07/18/23 - Seated Assisted Cervical Rotation with Towel  - 1-2 x daily - 7 x weekly - 1 sets - 10 reps - 3 hold - Quadruped Thoracic Rotation - Reach Under  - 1-2 x daily - 7 x weekly - 2 sets - 10 reps  Date: 07/08/2023 - Supine Chin Tuck  - 3 x daily - 7 x weekly - 2 sets - 10 reps - Seated Scapular Retraction  - 3 x daily - 7 x weekly - 2 sets - 10 reps - Seated Upper Trapezius Stretch  - 3 x daily - 7 x weekly - 3 reps - 20 second hold  ASSESSMENT:  CLINICAL IMPRESSION: Patient did not show continued improvements in function as indicated by no significant improvements in FOTO. Patient still presents with deficits in ROM and mobility With this, skilled PT is not required anymore at this time to address the impairments and functional limitations listed below and patient is D/C from skilled PT services. Patient just needs to continue his HEP and may need to re-consult his referring provider for further intervention. Interventions today were geared towards cervical mobility Tolerated all activities without worsening of symptoms. Demonstrated appropriate levels of fatigue. Provided slight amount of cueing to ensure correct execution of activity with good carry-over. Patient may also benefit from massage therapy to further help with the symptoms.   OBJECTIVE IMPAIRMENTS: decreased activity  tolerance, decreased endurance, decreased mobility, decreased ROM, decreased strength, increased muscle spasms, impaired flexibility, improper body mechanics, postural dysfunction, and pain.   ACTIVITY LIMITATIONS: carrying, lifting, bending, reach over head, hygiene/grooming, and caring for others  PARTICIPATION LIMITATIONS: meal prep, cleaning, laundry, driving, shopping, community activity, and yard work  PERSONAL FACTORS: Time since onset of injury/illness/exacerbation are also affecting patient's functional outcome.   REHAB POTENTIAL: Good  CLINICAL DECISION MAKING: Stable/uncomplicated  EVALUATION COMPLEXITY: Low   GOALS: Goals reviewed with patient? Yes  SHORT TERM GOALS: Target date: 07/22/2023    Patient will be independent with HEP in order to improve functional outcomes. Baseline: Goal status: MET  2.  Patient will report at least 25% improvement in symptoms for  improved quality of life. Baseline:  Goal status: NOT MET    LONG TERM GOALS: Target date: 08/01/2023    Patient will report at least 75% improvement in symptoms for improved quality of life. Baseline:  Goal status: NOT MET  2.  Patient will improve FOTO score by at least 2 points in order to indicate improved tolerance to activity. Baseline: 71% function Goal status: NOT MET  3.  Patient will demonstrate at least 25% improvement in cervical ROM in all restricted planes for improved ability to move head while driving. Baseline: see above Goal status: IN PROGRESS  4.  Patient will be able to return to all activities unrestricted for improved ability to perform household work and participate with family.  Baseline:  Goal status: MET  5.  Patient will demonstrate grade of 5/5 MMT grade in all tested musculature as evidence of improved strength to assist with lifting at home. Baseline: see above Goal status: MET       PLAN:  PT FREQUENCY:  0  PT DURATION: other: 0  PLANNED INTERVENTIONS: D/C  from skilled PT services due to little to no improvement in condition and function.Continue with HEP and re-check with provider.   Tish Frederickson. Abbygael Curtiss, PT, DPT, OCS Board-Certified Clinical Specialist in Orthopedic PT PT Compact Privilege # ([place]): [id number] T 08/01/2023, 9:15 AM

## 2023-08-02 NOTE — Progress Notes (Unsigned)
Referring Physician:  Tommie Sams, DO [address] [place],  [place] [address]  Primary Physician:  Tommie Sams, DO  History of Present Illness: 08/02/2023 Mr. Massimiliano Forbush has a history of CKD 3, HTN, hyperlipidemia, and CAD with stents.   Last seen by me on 04/16/23 for intermittent neck pain that radiated to right shoulder. He has known cervical spondylosis C3-C7.   He was sent to PT and did 8 visits. He was discharged on 08/01/23.   He is here for follow up.   He has intermittent right sided neck pain to his shoulder that is only when he turns to look to the right. He has some tingling in the shoulder. No arm pain. No weakness in arms. No numbness. Current pain is tolerable.   He does not smoke.   Conservative measures:  Physical therapy: initial eval on 07/08/23, did 7 visits, was discharged on 08/01/23.  Multimodal medical therapy including regular antiinflammatories: prednisone, tylenol, voltaren gel Injections: No epidural steroid injections  Past Surgery: No spinal surgery.   MACKAY TEUFEL has no symptoms of cervical myelopathy.  The symptoms are causing a significant impact on the patient's life.   Review of Systems:  A 10 point review of systems is negative, except for the pertinent positives and negatives detailed in the HPI.  Past Medical History: Past Medical History:  Diagnosis Date   Adenomatous polyps    CAD (coronary artery disease)    GERD (gastroesophageal reflux disease)    Hypertension    Impaired fasting glucose    Seasonal allergies     Past Surgical History: Past Surgical History:  Procedure Laterality Date   COLONOSCOPY  10/03/2005   inflamed hyperplastic polyp/adenomatous poylp, left sided diverticula   COLONOSCOPY  03/03/2012   Procedure: COLONOSCOPY;  Surgeon: Corbin Ade, MD;  [facility];  Service: Endoscopy;  Laterality: N/A;  10:15   COLONOSCOPY N/A 04/11/2017   one 5 mm polyp hepatic flexure. Sigmoid  diverticulosis. Sessile serrated adenoma.   COLONOSCOPY WITH PROPOFOL N/A 08/10/2022   Procedure: COLONOSCOPY WITH PROPOFOL;  Surgeon: Corbin Ade, MD;  [facility];  Service: Endoscopy;  Laterality: N/A;  7:30AM   CORONARY ANGIOPLASTY WITH STENT PLACEMENT  07/16/2014   "1"   KNEE ARTHROSCOPY Right ~ 1994   KNEE ARTHROSCOPY Left ~ 2001   LEFT AND RIGHT HEART CATHETERIZATION WITH CORONARY ANGIOGRAM N/A 07/16/2014   Procedure: LEFT AND RIGHT HEART CATHETERIZATION WITH CORONARY ANGIOGRAM;  Surgeon: Corky Crafts, MD;  Location: [facility] CATH LAB;  Service: Cardiovascular;  Laterality: N/A;   POLYPECTOMY  04/11/2017   Procedure: POLYPECTOMY;  Surgeon: Corbin Ade, MD;  [facility];  Service: Endoscopy;;  colon   POLYPECTOMY  08/10/2022   Procedure: POLYPECTOMY;  Surgeon: Corbin Ade, MD;  [facility];  Service: Endoscopy;;    Allergies: Allergies as of 08/06/2023 - Review Complete 07/08/2023  Allergen Reaction Noted   Ketoconazole  01/14/2023    Medications: Outpatient Encounter Medications as of 08/06/2023  Medication Sig   acetaminophen (TYLENOL) 500 MG tablet Take 500-1,000 mg by mouth every 6 (six) hours as needed for mild pain or headache.    aspirin 81 MG tablet Take 81 mg by mouth daily.   atorvastatin (LIPITOR) 40 MG tablet TAKE 1 TABLET BY MOUTH EVERY DAY IN THE EVENING   cetirizine (ZYRTEC) 10 MG tablet Take 10 mg by mouth daily as needed for allergies.  cholecalciferol (VITAMIN D3) 25 MCG (1000 UNIT) tablet Take 1,000 Units by mouth daily.   diclofenac Sodium (VOLTAREN) 1 % GEL APPLY 2 G TOPICALLY 4 (FOUR) TIMES DAILY AS NEEDED (PAIN).   famotidine (PEPCID) 40 MG tablet TAKE 1 TABLET BY MOUTH EVERY DAY   ketoconazole (NIZORAL) 2 % cream Apply 1 Application topically daily as needed for irritation.   lisinopril (ZESTRIL) 10 MG tablet TAKE 1 TABLET BY MOUTH EVERY DAY   metoprolol tartrate (LOPRESSOR) 25 MG tablet TAKE 1/2 TABLET BY  MOUTH TWICE A DAY   nitroGLYCERIN (NITROSTAT) 0.4 MG SL tablet ONE TABLET UNDER THE TONGUE EVERY 5 MINUTES AS NEEDED FOR CHEST PAIN. MAX 3 DOSES IN 15 MIN   Zinc 50 MG TABS Take 50 mg by mouth daily.   No facility-administered encounter medications on file as of 08/06/2023.    Social History: Social History   Tobacco Use   Smoking status: Never   Smokeless tobacco: Never  Vaping Use   Vaping status: Never Used  Substance Use Topics   Alcohol use: Yes    Alcohol/week: 2.0 standard drinks of alcohol    Types: 2 Glasses of wine per week   Drug use: No    Family Medical History: Family History  Problem Relation Age of Onset   Liver cancer Father    Cancer Father        liver   Colon cancer Neg Hx     Physical Examination: There were no vitals filed for this visit.    Awake, alert, oriented to person, place, and time.  Speech is clear and fluent. Fund of knowledge is appropriate.   Cranial Nerves: Pupils equal round and reactive to light.  Facial tone is symmetric.    No posterior cervical tenderness. He has tenderness in right trapezius.   No abnormal lesions on exposed skin.   Strength: Side Biceps Triceps Deltoid Interossei Grip Wrist Ext. Wrist Flex.  R 5 5 5 5 5 5 5   L 5 5 5 5 5 5 5    Reflexes are 1+ and symmetric at the biceps and brachioradialis.  Hoffman's is absent.   Bilateral upper extremity sensation is intact to light touch.     Gait is normal.    Medical Decision Making  Imaging: none  Assessment and Plan: Mr. Mcillwain is a pleasant 77 y.o. male who has intermittent right sided neck pain to his shoulder that is only when he turns to look to the right. He has some tingling in the shoulder. No arm pain.   He has known cervical spondylosis C3-C7. His current pain is intermittent and tolerable.   Treatment options discussed with patient and following plan made:   - Continue with HEP from PT.  - Consider referral to PMR for possible TPI if he gets  worse. He declines for now.  - Follow up with me prn.   I spent a total of 15 minutes in face-to-face and non-face-to-face activities related to this patient's care today including review of outside records, review of imaging, review of symptoms, physical exam, discussion of differential diagnosis, discussion of treatment options, and documentation.   Drake Leach PA-C Dept. of Neurosurgery

## 2023-08-06 ENCOUNTER — Ambulatory Visit: Payer: PPO | Admitting: Orthopedic Surgery

## 2023-08-06 ENCOUNTER — Encounter (HOSPITAL_COMMUNITY): Payer: PPO

## 2023-08-06 ENCOUNTER — Encounter: Payer: Self-pay | Admitting: Orthopedic Surgery

## 2023-08-06 VITALS — BP 124/60 | Ht 72.0 in | Wt 203.2 lb

## 2023-08-06 DIAGNOSIS — M47812 Spondylosis without myelopathy or radiculopathy, cervical region: Secondary | ICD-10-CM | POA: Diagnosis not present

## 2023-08-08 ENCOUNTER — Encounter (HOSPITAL_COMMUNITY): Payer: PPO

## 2023-10-08 ENCOUNTER — Other Ambulatory Visit: Payer: Self-pay | Admitting: Family Medicine

## 2023-11-04 DIAGNOSIS — R739 Hyperglycemia, unspecified: Secondary | ICD-10-CM

## 2023-11-04 DIAGNOSIS — I10 Essential (primary) hypertension: Secondary | ICD-10-CM

## 2023-11-04 DIAGNOSIS — E785 Hyperlipidemia, unspecified: Secondary | ICD-10-CM

## 2023-11-19 NOTE — Telephone Encounter (Signed)
 Tommie Sams, DO    11/18/23  7:53 PM Please order CBC, CMP, Lipid, A1C, Urine microalbumin.  Thank you  Dr. Adriana Simas

## 2023-12-07 ENCOUNTER — Other Ambulatory Visit: Payer: Self-pay | Admitting: Family Medicine

## 2023-12-09 ENCOUNTER — Other Ambulatory Visit: Payer: Self-pay

## 2023-12-09 DIAGNOSIS — I10 Essential (primary) hypertension: Secondary | ICD-10-CM

## 2023-12-09 DIAGNOSIS — E785 Hyperlipidemia, unspecified: Secondary | ICD-10-CM

## 2023-12-09 MED ORDER — ATORVASTATIN CALCIUM 40 MG PO TABS: ORAL_TABLET | ORAL | 1 refills | Status: DC

## 2023-12-09 MED ORDER — LISINOPRIL 10 MG PO TABS: ORAL_TABLET | ORAL | 1 refills | Status: DC

## 2024-01-14 DIAGNOSIS — I10 Essential (primary) hypertension: Secondary | ICD-10-CM | POA: Diagnosis not present

## 2024-01-14 DIAGNOSIS — E785 Hyperlipidemia, unspecified: Secondary | ICD-10-CM | POA: Diagnosis not present

## 2024-01-14 DIAGNOSIS — R739 Hyperglycemia, unspecified: Secondary | ICD-10-CM | POA: Diagnosis not present

## 2024-01-14 NOTE — Progress Notes (Signed)
 Primary Cardiologist: Eden Emms  SUBJECTIVE:  78 y.o. f/u CAD. DES to LaD in 2015. D1 jailed at time His EF was normal by  TTE 2015 with trivial AR/MR He is on beta blocker, ACE and statin along with Baby aspirin He has CRF with baseline Cr of 1.68  He is very active with no angina Had classic angina prior to his stent Has daughter with 3 kids local and son with a child He is retired from Therapist, art to travel Buyer, retail of [employer] and big fan of school   Primary Luking checks labs  Back problems  MRI done 10/25/21 with multilevel mild/moderate degenerative dx but only  Mild stenosis L3-5 Sees Dr Romeo Apple Left leg is better now  Had nice trip to [place] last year and going to [place] this year   New nitroglycerin called in No angina   Soc Hx: He graduated from [employer] with a degree in Designer, fashion/clothing.  He worked in the [employer] for 38 years.  His sister-in-law, Drenda Freeze, is a Veterinary surgeon in [place].  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  Review of Systems: As per "subjective", otherwise negative.  Allergies  Allergen Reactions   Ketoconazole     Current Outpatient Medications  Medication Sig Dispense Refill   acetaminophen (TYLENOL) 500 MG tablet Take 500-1,000 mg by mouth every 6 (six) hours as needed for mild pain or headache.      aspirin 81 MG tablet Take 81 mg by mouth daily.     atorvastatin (LIPITOR) 40 MG tablet TAKE 1 TABLET BY MOUTH EVERY DAY IN THE EVENING 90 tablet 1   atorvastatin (LIPITOR) 40 MG tablet TAKE 1 TABLET BY MOUTH EVERY DAY IN THE EVENING 90 tablet 1   cetirizine (ZYRTEC) 10 MG tablet Take 10 mg by mouth daily as needed for allergies.     cholecalciferol (VITAMIN D3) 25 MCG (1000 UNIT) tablet Take 1,000 Units by mouth daily.     diclofenac Sodium (VOLTAREN) 1 % GEL APPLY 2 G TOPICALLY 4 (FOUR) TIMES DAILY AS NEEDED (PAIN). 100 g 0    famotidine (PEPCID) 40 MG tablet TAKE 1 TABLET BY MOUTH EVERY DAY 90 tablet 1   ketoconazole (NIZORAL) 2 % cream Apply 1 Application topically daily as needed for irritation.     lisinopril (ZESTRIL) 10 MG tablet TAKE 1 TABLET BY MOUTH EVERY DAY 90 tablet 1   lisinopril (ZESTRIL) 10 MG tablet TAKE 1 TABLET BY MOUTH EVERY DAY 90 tablet 1   metoprolol tartrate (LOPRESSOR) 25 MG tablet TAKE 1/2 TABLET BY MOUTH TWICE A DAY 90 tablet 3   nitroGLYCERIN (NITROSTAT) 0.4 MG SL tablet ONE TABLET UNDER THE TONGUE EVERY 5 MINUTES AS NEEDED FOR CHEST PAIN. MAX 3 DOSES IN 15 MIN 25 tablet 2   Zinc 50 MG TABS Take 50 mg by mouth daily.     No current facility-administered medications for this visit.    Past Medical History:  Diagnosis Date   Adenomatous polyps    CAD (coronary artery disease)    GERD (gastroesophageal reflux disease)    Hypertension    Impaired fasting glucose    Seasonal allergies     Past Surgical History:  Procedure Laterality Date   COLONOSCOPY  10/03/2005   inflamed hyperplastic polyp/adenomatous poylp, left sided diverticula   COLONOSCOPY  03/03/2012   Procedure: COLONOSCOPY;  Surgeon: Corbin Ade, MD;  [facility];  Service: Endoscopy;  Laterality: N/A;  10:15   COLONOSCOPY N/A 04/11/2017   one 5 mm polyp hepatic flexure. Sigmoid diverticulosis. Sessile serrated adenoma.   COLONOSCOPY WITH PROPOFOL N/A 08/10/2022   Procedure: COLONOSCOPY WITH PROPOFOL;  Surgeon: Corbin Ade, MD;  Location: [facility];  Service: Endoscopy;  Laterality: N/A;  7:30AM   CORONARY ANGIOPLASTY WITH STENT PLACEMENT  07/16/2014   "1"   KNEE ARTHROSCOPY Right ~ 1994   KNEE ARTHROSCOPY Left ~ 2001   LEFT AND RIGHT HEART CATHETERIZATION WITH CORONARY ANGIOGRAM N/A 07/16/2014   Procedure: LEFT AND RIGHT HEART CATHETERIZATION WITH CORONARY ANGIOGRAM;  Surgeon: Corky Crafts, MD;  Location: [facility] CATH LAB;  Service: Cardiovascular;  Laterality: N/A;   POLYPECTOMY  04/11/2017    Procedure: POLYPECTOMY;  Surgeon: Corbin Ade, MD;  [facility];  Service: Endoscopy;;  colon   POLYPECTOMY  08/10/2022   Procedure: POLYPECTOMY;  Surgeon: Corbin Ade, MD;  Location: [facility];  Service: Endoscopy;;    Social History   Socioeconomic History   Marital status: Married    Spouse name: Not on file   Number of children: Not on file   Years of education: Not on file   Highest education level: Not on file  Occupational History   Not on file  Tobacco Use   Smoking status: Never   Smokeless tobacco: Never  Vaping Use   Vaping status: Never Used  Substance and Sexual Activity   Alcohol use: Yes    Alcohol/week: 2.0 standard drinks of alcohol    Types: 2 Glasses of wine per week   Drug use: No   Sexual activity: Yes  Other Topics Concern   Not on file  Social History Narrative   Not on file   Social Drivers of Health   Financial Resource Strain: Patient Declined (03/21/2023)   Overall Financial Resource Strain (CARDIA)    Difficulty of Paying Living Expenses: Patient declined  Food Insecurity: Patient Declined (03/21/2023)   Hunger Vital Sign    Worried About Running Out of Food in the Last Year: Patient declined    Ran Out of Food in the Last Year: Patient declined  Transportation Needs: Patient Declined (03/21/2023)   PRAPARE - Administrator, Civil Service (Medical): Patient declined    Lack of Transportation (Non-Medical): Patient declined  Physical Activity: Unknown (03/21/2023)   Exercise Vital Sign    Days of Exercise per Week: Patient declined    Minutes of Exercise per Session: Not on file  Stress: Patient Declined (03/21/2023)   [employer] of Occupational Health - Occupational Stress Questionnaire    Feeling of Stress : Patient declined  Social Connections: Unknown (03/21/2023)   Social Connection and Isolation Panel [NHANES]    Frequency of Communication with Friends and Family: Patient declined    Frequency of  Social Gatherings with Friends and Family: Patient declined    Attends Religious Services: Patient declined    Active Member of Clubs or Organizations: Patient declined    Attends Banker Meetings: Not on file    Marital Status: Patient declined  Intimate Partner Violence: Not At Risk (01/18/2023)   Humiliation, Afraid, Rape, and Kick questionnaire    Fear of Current or Ex-Partner: No    Emotionally Abused: No    Physically Abused: No    Sexually Abused: No      Vitals:   01/23/24 1339  BP: (!) 152/80  Pulse: 69  SpO2: 96%  Weight: 201 lb 3.2 oz (91.3 kg)  Height: 6' (1.829 m)      Wt Readings from Last 3 Encounters:  01/23/24 201 lb 3.2 oz (91.3 kg)  08/06/23 203 lb 3.2 oz (92.2 kg)  04/16/23 203 lb (92.1 kg)     PHYSICAL EXAM  Affect appropriate Healthy:  appears stated age HEENT: normal Neck supple with no adenopathy JVP normal no bruits no thyromegaly Lungs clear with no wheezing and good diaphragmatic motion Heart:  S1/S2 no murmur, no rub, gallop or click PMI normal Abdomen: benighn, BS positve, no tenderness, no AAA no bruit.  No HSM or HJR Distal pulses intact with no bruits No edema Neuro non-focal Skin warm and dry No muscular weakness   Labs: Lab Results  Component Value Date/Time   K 4.8 01/14/2024 07:47 AM   BUN 20 01/14/2024 07:47 AM   CREATININE 1.44 (H) 01/14/2024 07:47 AM   CREATININE 1.30 10/19/2014 07:43 AM   ALT 38 01/14/2024 07:47 AM   HGB 15.4 01/14/2024 07:47 AM     Lipids: Lab Results  Component Value Date/Time   LDLCALC 63 01/14/2024 07:47 AM   CHOL 129 01/14/2024 07:47 AM   TRIG 94 01/14/2024 07:47 AM   HDL 48 01/14/2024 07:47 AM     ECG:  SR LVH  PAC otherwise normal 01/23/2024   ASSESSMENT AND PLAN:  1. CAD s/p LAD DES: 2015 Stable ischemic heart disease. Continue aspirin, metoprolol, and statin therapy.  Active with no chest pain    2.  Chronic hypertension: continue current meds    3. Hyperlipidemia:   LDL at goal, 65 12/26/22   Continue statin therapy.  4. CRF:  Bi yearly Cr with Dr Gerda Diss avoid diuretics ACE ok for now Cr 1.68  5. Sciatica:  right leg MRI not surgical may improved no need for injections or surgery    Disposition: Follow up 1 year      Charlton Haws MD [facility]

## 2024-01-16 LAB — CBC WITH DIFFERENTIAL/PLATELET
Basophils Absolute: 0.1 10*3/uL (ref 0.0–0.2)
Basos: 2 %
EOS (ABSOLUTE): 0.4 10*3/uL (ref 0.0–0.4)
Eos: 5 %
Hematocrit: 46.3 % (ref 37.5–51.0)
Hemoglobin: 15.4 g/dL (ref 13.0–17.7)
Immature Grans (Abs): 0 10*3/uL (ref 0.0–0.1)
Immature Granulocytes: 0 %
Lymphocytes Absolute: 1.7 10*3/uL (ref 0.7–3.1)
Lymphs: 23 %
MCH: 29.7 pg (ref 26.6–33.0)
MCHC: 33.3 g/dL (ref 31.5–35.7)
MCV: 89 fL (ref 79–97)
Monocytes Absolute: 0.7 10*3/uL (ref 0.1–0.9)
Monocytes: 10 %
Neutrophils Absolute: 4.5 10*3/uL (ref 1.4–7.0)
Neutrophils: 60 %
Platelets: 195 10*3/uL (ref 150–450)
RBC: 5.19 x10E6/uL (ref 4.14–5.80)
RDW: 12.9 % (ref 11.6–15.4)
WBC: 7.5 10*3/uL (ref 3.4–10.8)

## 2024-01-16 LAB — MICROALBUMIN / CREATININE URINE RATIO
Creatinine, Urine: 176.1 mg/dL
Microalb/Creat Ratio: 31 mg/g{creat} — ABNORMAL HIGH (ref 0–29)
Microalbumin, Urine: 54.1 ug/mL

## 2024-01-16 LAB — COMPREHENSIVE METABOLIC PANEL
ALT: 38 [IU]/L (ref 0–44)
AST: 27 [IU]/L (ref 0–40)
Albumin: 4.4 g/dL (ref 3.8–4.8)
Alkaline Phosphatase: 117 [IU]/L (ref 44–121)
BUN/Creatinine Ratio: 14 (ref 10–24)
BUN: 20 mg/dL (ref 8–27)
Bilirubin Total: 0.8 mg/dL (ref 0.0–1.2)
CO2: 23 mmol/L (ref 20–29)
Calcium: 9.4 mg/dL (ref 8.6–10.2)
Chloride: 103 mmol/L (ref 96–106)
Creatinine, Ser: 1.44 mg/dL — ABNORMAL HIGH (ref 0.76–1.27)
Globulin, Total: 2.4 g/dL (ref 1.5–4.5)
Glucose: 110 mg/dL — ABNORMAL HIGH (ref 70–99)
Potassium: 4.8 mmol/L (ref 3.5–5.2)
Sodium: 142 mmol/L (ref 134–144)
Total Protein: 6.8 g/dL (ref 6.0–8.5)
eGFR: 50 mL/min/{1.73_m2} — ABNORMAL LOW (ref >59)

## 2024-01-16 LAB — HEMOGLOBIN A1C
Est. average glucose Bld gHb Est-mCnc: 128 mg/dL
Hgb A1c MFr Bld: 6.1 % — ABNORMAL HIGH (ref 4.8–5.6)

## 2024-01-16 LAB — LIPID PANEL
Chol/HDL Ratio: 2.7 {ratio} (ref 0.0–5.0)
Cholesterol, Total: 129 mg/dL (ref 100–199)
HDL: 48 mg/dL (ref >39)
LDL Chol Calc (NIH): 63 mg/dL (ref 0–99)
Triglycerides: 94 mg/dL (ref 0–149)
VLDL Cholesterol Cal: 18 mg/dL (ref 5–40)

## 2024-01-19 ENCOUNTER — Encounter: Payer: Self-pay | Admitting: Family Medicine

## 2024-01-22 ENCOUNTER — Other Ambulatory Visit: Payer: Self-pay

## 2024-01-22 MED ORDER — NITROGLYCERIN 0.4 MG SL SUBL: SUBLINGUAL_TABLET | SUBLINGUAL | 2 refills | Status: AC

## 2024-01-23 ENCOUNTER — Ambulatory Visit: Payer: PPO | Attending: Cardiovascular Disease | Admitting: Cardiovascular Disease

## 2024-01-23 ENCOUNTER — Encounter: Payer: Self-pay | Admitting: Cardiovascular Disease

## 2024-01-23 VITALS — BP 152/80 | HR 69 | Ht 72.0 in | Wt 201.2 lb

## 2024-01-23 DIAGNOSIS — I2510 Atherosclerotic heart disease of native coronary artery without angina pectoris: Secondary | ICD-10-CM | POA: Diagnosis not present

## 2024-01-23 DIAGNOSIS — I10 Essential (primary) hypertension: Secondary | ICD-10-CM

## 2024-01-23 DIAGNOSIS — E785 Hyperlipidemia, unspecified: Secondary | ICD-10-CM

## 2024-01-23 NOTE — Patient Instructions (Signed)
 Medication Instructions:  Your physician recommends that you continue on your current medications as directed. Please refer to the Current Medication list given to you today.  *If you need a refill on your cardiac medications before your next appointment, please call your pharmacy*   Lab Work: NONE   If you have labs (blood work) drawn today and your tests are completely normal, you will receive your results only by: MyChart Message (if you have MyChart) OR A paper copy in the mail If you have any lab test that is abnormal or we need to change your treatment, we will call you to review the results.   Testing/Procedures: NONE    Follow-Up: At [facility], you and your health needs are our priority.  As part of our continuing mission to provide you with exceptional heart care, we have created designated Provider Care Teams.  These Care Teams include your primary Cardiologist (physician) and Advanced Practice Providers (APPs -  Physician Assistants and Nurse Practitioners) who all work together to provide you with the care you need, when you need it.  We recommend signing up for the patient portal called "MyChart".  Sign up information is provided on this After Visit Summary.  MyChart is used to connect with patients for Virtual Visits (Telemedicine).  Patients are able to view lab/test results, encounter notes, upcoming appointments, etc.  Non-urgent messages can be sent to your provider as well.   To learn more about what you can do with MyChart, go to [email or web address].    Your next appointment:   1 year(s)  Provider:   You may see Charlton Haws, MD or one of the following Advanced Practice Providers on your designated Care Team:   Randall An, PA-C  Jacolyn Reedy, PA-C     Other Instructions Thank you for choosing [facility]!

## 2024-01-24 ENCOUNTER — Ambulatory Visit (INDEPENDENT_AMBULATORY_CARE_PROVIDER_SITE_OTHER): Payer: HMO

## 2024-01-24 VITALS — Ht 72.0 in | Wt 201.0 lb

## 2024-01-24 DIAGNOSIS — Z0000 Encounter for general adult medical examination without abnormal findings: Secondary | ICD-10-CM

## 2024-01-24 NOTE — Patient Instructions (Addendum)
 Mr. Roger [name] , Thank you for taking time to come for your Medicare Wellness Visit. I appreciate your ongoing commitment to your health goals. Please review the following plan we discussed and let me know if I can assist you in the future.   Referrals/Orders/Follow-Ups/Clinician Recommendations:  Next Medicare Annual Wellness Visit:   January 29, 2025 at 9:20 am telephone visit.  Your next appointment is Dr. Adriana Simas is February 17, 2024 at 1:30pm  This is a list of the screening recommended for you and due dates:  Health Maintenance  Topic Date Due   COVID-19 Vaccine (4 - 2024-25 season) 07/21/2023   Zoster (Shingles) Vaccine (1 of 2) 03/19/2024*   Medicare Annual Wellness Visit  01/23/2025   Colon Cancer Screening  08/11/2027   DTaP/Tdap/Td vaccine (2 - Td or Tdap) 10/12/2032   Flu Shot  Completed   Hepatitis C Screening  Completed   HPV Vaccine  Aged Out   Pneumonia Vaccine  Discontinued  *Topic was postponed. The date shown is not the original due date.    Advanced directives: (In Chart) A copy of your advanced directives are scanned into your chart should your provider ever need it.  Next Medicare Annual Wellness Visit scheduled for next year: yes  Understanding Your Risk for Falls Millions of people have serious injuries from falls each year. It is important to understand your risk of falling. Talk with your health care provider about your risk and what you can do to lower it. If you do have a serious fall, make sure to tell your provider. Falling once raises your risk of falling again. How can falls affect me? Serious injuries from falls are common. These include: Broken bones, such as hip fractures. Head injuries, such as traumatic brain injuries (TBI) or concussions. A fear of falling can cause you to avoid activities and stay at home. This can make your muscles weaker and raise your risk for a fall. What can increase my risk? There are a number of risk factors that increase your  risk for falling. The more risk factors you have, the higher your risk of falling. Serious injuries from a fall happen most often to people who are older than 78 years old. Teenagers and young adults ages 2-29 are also at higher risk. Common risk factors include: Weakness in the lower body. Being generally weak or confused due to long-term (chronic) illness. Dizziness or balance problems. Poor vision. Medicines that cause dizziness or drowsiness. These may include: Medicines for your blood pressure, heart, anxiety, insomnia, or swelling (edema). Pain medicines. Muscle relaxants. Other risk factors include: Drinking alcohol. Having had a fall in the past. Having foot pain or wearing improper footwear. Working at a dangerous job. Having any of the following in your home: Tripping hazards, such as floor clutter or loose rugs. Poor lighting. Pets. Having dementia or memory loss. What actions can I take to lower my risk of falling?     Physical activity Stay physically fit. Do strength and balance exercises. Consider taking a regular class to build strength and balance. Yoga and tai chi are good options. Vision Have your eyes checked every year and your prescription for glasses or contacts updated as needed. Shoes and walking aids Wear non-skid shoes. Wear shoes that have rubber soles and low heels. Do not wear high heels. Do not walk around the house in socks or slippers. Use a cane or walker as told by your provider. Home safety Attach secure railings on both sides of  your stairs. Install grab bars for your bathtub, shower, and toilet. Use a non-skid mat in your bathtub or shower. Attach bath mats securely with double-sided, non-slip rug tape. Use good lighting in all rooms. Keep a flashlight near your bed. Make sure there is a clear path from your bed to the bathroom. Use night-lights. Do not use throw rugs. Make sure all carpeting is taped or tacked down securely. Remove all  clutter from walkways and stairways, including extension cords. Repair uneven or broken steps and floors. Avoid walking on icy or slippery surfaces. Walk on the grass instead of on icy or slick sidewalks. Use ice melter to get rid of ice on walkways in the winter. Use a cordless phone. Questions to ask your health care provider Can you help me check my risk for a fall? Do any of my medicines make me more likely to fall? Should I take a vitamin D supplement? What exercises can I do to improve my strength and balance? Should I make an appointment to have my vision checked? Do I need a bone density test to check for weak bones (osteoporosis)? Would it help to use a cane or a walker? Where to find more information Centers for Disease Control and Prevention, STEADI: TonerPromos.no Community-Based Fall Prevention Programs: TonerPromos.no [employer] on Aging: BaseRingTones.pl Contact a health care provider if: You fall at home. You are afraid of falling at home. You feel weak, drowsy, or dizzy. This information is not intended to replace advice given to you by your health care provider. Make sure you discuss any questions you have with your health care provider. Document Revised: 07/09/2022 Document Reviewed: 07/09/2022 Elsevier Patient Education  2024 [employer].

## 2024-01-24 NOTE — Progress Notes (Signed)
 Because this visit was a virtual/telehealth visit,  certain criteria was not obtained, such a blood pressure, CBG if applicable, and timed get up and go. Any medications not marked as "taking" were not mentioned during the medication reconciliation part of the visit. Any vitals not documented were not able to be obtained due to this being a telehealth visit or patient was unable to self-report a recent blood pressure reading due to a lack of equipment at home via telehealth. Vitals that have been documented are verbally provided by the patient.   Subjective:   Roger [name] is a 78 y.o. who presents for a Medicare Wellness preventive visit.  Visit Complete: Virtual I connected with  [name] on 01/24/24 by a audio enabled telemedicine application and verified that I am speaking with the correct person using two identifiers.  Patient Location: Home  Provider Location: Home Office  I discussed the limitations of evaluation and management by telemedicine. The patient expressed understanding and agreed to proceed.  Vital Signs: Because this visit was a virtual/telehealth visit, some criteria may be missing or patient reported. Any vitals not documented were not able to be obtained and vitals that have been documented are patient reported.  VideoDeclined- This patient declined Librarian, academic. Therefore the visit was completed with audio only.  AWV Questionnaire: No: Patient Medicare AWV questionnaire was not completed prior to this visit.  Cardiac Risk Factors include: advanced age (>59men, >42 women);male gender;hypertension     Objective:    Today's Vitals   01/24/24 0903  Weight: 201 lb (91.2 kg)  Height: 6' (1.829 m)   Body mass index is 27.26 kg/m.     01/24/2024    8:44 AM 07/08/2023    8:20 AM 01/18/2023    8:40 AM 08/10/2022    6:40 AM 08/08/2022    1:43 PM 04/11/2017    1:13 PM 08/26/2014   12:43 PM  Advanced Directives  Does Patient  Have a Medical Advance Directive? Yes Yes Yes No No Yes Yes  Type of Estate agent of [place];Living will  Living will;Healthcare Power of Attorney   Living will Healthcare Power of [place];Living will  Does patient want to make changes to medical advance directive? No - Patient declined No - Patient declined No - Patient declined    No - Patient declined  Copy of Healthcare Power of Attorney in Chart? Yes - validated most recent copy scanned in chart (See row information)  Yes - validated most recent copy scanned in chart (See row information)    No - copy requested  Would patient like information on creating a medical advance directive?    No - Patient declined No - Patient declined No - Patient declined     Current Medications (verified) Outpatient Encounter Medications as of 01/24/2024  Medication Sig   acetaminophen (TYLENOL) 500 MG tablet Take 500-1,000 mg by mouth every 6 (six) hours as needed for mild pain or headache.    aspirin 81 MG tablet Take 81 mg by mouth daily.   atorvastatin (LIPITOR) 40 MG tablet TAKE 1 TABLET BY MOUTH EVERY DAY IN THE EVENING   atorvastatin (LIPITOR) 40 MG tablet TAKE 1 TABLET BY MOUTH EVERY DAY IN THE EVENING   cetirizine (ZYRTEC) 10 MG tablet Take 10 mg by mouth daily as needed for allergies.   cholecalciferol (VITAMIN D3) 25 MCG (1000 UNIT) tablet Take 1,000 Units by mouth daily.   diclofenac Sodium (VOLTAREN) 1 % GEL APPLY  2 G TOPICALLY 4 (FOUR) TIMES DAILY AS NEEDED (PAIN).   famotidine (PEPCID) 40 MG tablet TAKE 1 TABLET BY MOUTH EVERY DAY   ketoconazole (NIZORAL) 2 % cream Apply 1 Application topically daily as needed for irritation.   lisinopril (ZESTRIL) 10 MG tablet TAKE 1 TABLET BY MOUTH EVERY DAY   lisinopril (ZESTRIL) 10 MG tablet TAKE 1 TABLET BY MOUTH EVERY DAY   metoprolol tartrate (LOPRESSOR) 25 MG tablet TAKE 1/2 TABLET BY MOUTH TWICE A DAY   nitroGLYCERIN (NITROSTAT) 0.4 MG SL tablet ONE TABLET UNDER THE TONGUE EVERY 5  MINUTES AS NEEDED FOR CHEST PAIN. MAX 3 DOSES IN 15 MIN   Zinc 50 MG TABS Take 50 mg by mouth daily.   No facility-administered encounter medications on file as of 01/24/2024.    Allergies (verified) Ketoconazole   History: Past Medical History:  Diagnosis Date   Adenomatous polyps    CAD (coronary artery disease)    GERD (gastroesophageal reflux disease)    Hypertension    Impaired fasting glucose    Seasonal allergies    Past Surgical History:  Procedure Laterality Date   COLONOSCOPY  10/03/2005   inflamed hyperplastic polyp/adenomatous poylp, left sided diverticula   COLONOSCOPY  03/03/2012   Procedure: COLONOSCOPY;  Surgeon: Corbin Ade, MD;  [facility]: [facility];  Service: Endoscopy;  Laterality: N/A;  10:15   COLONOSCOPY N/A 04/11/2017   one 5 mm polyp hepatic flexure. Sigmoid diverticulosis. Sessile serrated adenoma.   COLONOSCOPY WITH PROPOFOL N/A 08/10/2022   Procedure: COLONOSCOPY WITH PROPOFOL;  Surgeon: Corbin Ade, MD;  Location: [facility];  Service: Endoscopy;  Laterality: N/A;  7:30AM   CORONARY ANGIOPLASTY WITH STENT PLACEMENT  07/16/2014   "1"   KNEE ARTHROSCOPY Right ~ 1994   KNEE ARTHROSCOPY Left ~ 2001   LEFT AND RIGHT HEART CATHETERIZATION WITH CORONARY ANGIOGRAM N/A 07/16/2014   Procedure: LEFT AND RIGHT HEART CATHETERIZATION WITH CORONARY ANGIOGRAM;  Surgeon: Corky Crafts, MD;  Location: [facility] CATH LAB;  Service: Cardiovascular;  Laterality: N/A;   POLYPECTOMY  04/11/2017   Procedure: POLYPECTOMY;  Surgeon: Corbin Ade, MD;  Location: [facility];  Service: Endoscopy;;  colon   POLYPECTOMY  08/10/2022   Procedure: POLYPECTOMY;  Surgeon: Corbin Ade, MD;  Location: [facility];  Service: Endoscopy;;   Family History  Problem Relation Age of Onset   Liver cancer Father    Cancer Father        liver   Colon cancer Neg Hx    Social History   Socioeconomic History   Marital status: Married    Spouse name: Not on file    Number of children: Not on file   Years of education: Not on file   Highest education level: Not on file  Occupational History   Not on file  Tobacco Use   Smoking status: Never   Smokeless tobacco: Never  Vaping Use   Vaping status: Never Used  Substance and Sexual Activity   Alcohol use: Yes    Alcohol/week: 2.0 standard drinks of alcohol    Types: 2 Glasses of wine per week   Drug use: No   Sexual activity: Yes  Other Topics Concern   Not on file  Social History Narrative   Not on file   Social Drivers of Health   Financial Resource Strain: Patient Declined (01/24/2024)   Overall Financial Resource Strain (CARDIA)    Difficulty of Paying Living Expenses: Patient declined  Food  Insecurity: Patient Declined (01/24/2024)   Hunger Vital Sign    Worried About Running Out of Food in the Last Year: Patient declined    Ran Out of Food in the Last Year: Patient declined  Transportation Needs: Patient Declined (01/24/2024)   PRAPARE - Administrator, Civil Service (Medical): Patient declined    Lack of Transportation (Non-Medical): Patient declined  Physical Activity: Sufficiently Active (01/24/2024)   Exercise Vital Sign    Days of Exercise per Week: 5 days    Minutes of Exercise per Session: 50 min  Stress: Patient Declined (01/24/2024)   [employer] of Occupational Health - Occupational Stress Questionnaire    Feeling of Stress : Patient declined  Social Connections: Patient Declined (01/24/2024)   Social Connection and Isolation Panel [NHANES]    Frequency of Communication with Friends and Family: Patient declined    Frequency of Social Gatherings with Friends and Family: Patient declined    Attends Religious Services: Patient declined    Database administrator or Organizations: Patient declined    Attends Engineer, structural: Patient declined    Marital Status: Patient declined    Tobacco Counseling Counseling given: Yes    Clinical  Intake:  Pre-visit preparation completed: Yes  Pain : No/denies pain     BMI - recorded: 27.26 Nutritional Risks: None Diabetes: No  How often do you need to have someone help you when you read instructions, pamphlets, or other written materials from your doctor or pharmacy?: 1 - Never  Interpreter Needed?: No  Information entered by :: A Trevionne Advani, CMA   Activities of Daily Living     01/24/2024    9:04 AM  In your present state of health, do you have any difficulty performing the following activities:  Hearing? 0  Vision? 0  Difficulty concentrating or making decisions? 0  Walking or climbing stairs? 0  Dressing or bathing? 0  Doing errands, shopping? 0  Preparing Food and eating ? N  Using the Toilet? N  In the past six months, have you accidently leaked urine? N  Do you have problems with loss of bowel control? N  Managing your Medications? N  Managing your Finances? N  Housekeeping or managing your Housekeeping? N    Patient Care Team: Tommie Sams, DO as PCP - General (Family Medicine) Wendall Stade, MD as PCP - Cardiology (Cardiology) Jena Gauss Gerrit Friends, MD (Gastroenterology) [facility] (Orthopedic Surgery) Marcene Corning, MD as Consulting Physician (Orthopedic Surgery) Drake Leach, PA-C as Physician Assistant (Neurosurgery) Nita Sells, MD (Dermatology)  Indicate any recent Medical Services you may have received from other than [facility] providers in the past year (date may be approximate).     Assessment:   This is a routine wellness examination for Gionni.  Hearing/Vision screen Hearing Screening - Comments:: Patient wears hearing aids. Patient is up to date on yearly exams. [facility] Vision Screening - Comments:: Patient wears reading glasses only. Up to date with yearly exams.  Patient sees [facility] [place]   Goals Addressed             This Visit's Progress    Patient Stated       Travel  more       Depression Screen     01/24/2024    9:06 AM 04/10/2023   10:38 AM 03/22/2023    3:57 PM 01/18/2023    8:40 AM 01/04/2022    9:00 AM 10/23/2021  8:55 AM 01/03/2021    9:41 AM  PHQ 2/9 Scores  PHQ - 2 Score 0 0 0 0 0 0 0  PHQ- 9 Score 0 0 0        Fall Risk     01/24/2024    9:05 AM 01/14/2023   12:06 PM 01/04/2022    8:58 AM 12/31/2021    1:59 PM 10/23/2021    8:55 AM  Fall Risk   Falls in the past year? 0 0 0 0 0  Number falls in past yr: 0 0 0 0   Injury with Fall? 0 0 0 0   Risk for fall due to : No Fall Risks  No Fall Risks  No Fall Risks  Follow up Falls prevention discussed;Falls evaluation completed  Falls evaluation completed  Falls evaluation completed    MEDICARE RISK AT HOME:  Medicare Risk at Home Any stairs in or around the home?: Yes If so, are there any without handrails?: No Home free of loose throw rugs in walkways, pet beds, electrical cords, etc?: Yes Adequate lighting in your home to reduce risk of falls?: Yes Life alert?: No Use of a cane, walker or w/c?: No Grab bars in the bathroom?: No Shower chair or bench in shower?: No Elevated toilet seat or a handicapped toilet?: Yes  TIMED UP AND GO:  Was the test performed?  No  Cognitive Function: 6CIT completed        01/24/2024    9:06 AM 01/18/2023    8:41 AM  6CIT Screen  What Year? 0 points 0 points  What month? 0 points 0 points  What time? 0 points 0 points  Count back from 20 0 points 0 points  Months in reverse 0 points 0 points  Repeat phrase 0 points 0 points  Total Score 0 points 0 points    Immunizations Immunization History  Administered Date(s) Administered   Fluad Quad(high Dose 65+) 08/30/2020   Influenza, High Dose Seasonal PF 09/02/2018, 09/25/2021   Influenza,inj,Quad PF,6+ Mos 08/25/2015   Influenza-Unspecified 08/21/2013, 09/07/2014, 08/15/2016, 08/01/2017, 09/02/2018, 08/20/2019, 12/06/2019   Moderna SARS-COV2 Booster Vaccination 09/15/2020   Moderna  Sars-Covid-2 Vaccination 12/10/2019, 01/11/2020   Pneumococcal Conjugate-13 10/22/2014   Pneumococcal-Unspecified 10/14/2012   Tdap 10/12/2022   Zoster, Live 11/20/2011    Screening Tests Health Maintenance  Topic Date Due   COVID-19 Vaccine (4 - 2024-25 season) 07/21/2023   Zoster Vaccines- Shingrix (1 of 2) 03/19/2024 (Originally 01/18/1996)   Medicare Annual Wellness (AWV)  01/23/2025   Colonoscopy  08/11/2027   DTaP/Tdap/Td (2 - Td or Tdap) 10/12/2032   INFLUENZA VACCINE  Completed   Hepatitis C Screening  Completed   HPV VACCINES  Aged Out   Pneumonia Vaccine 101+ Years old  Discontinued    Health Maintenance  Health Maintenance Due  Topic Date Due   COVID-19 Vaccine (4 - 2024-25 season) 07/21/2023   Health Maintenance Items Addressed: Health Maintenance is up to date  Additional Screening:  Vision Screening: Recommended annual ophthalmology exams for early detection of glaucoma and other disorders of the eye.  Dental Screening: Recommended annual dental exams for proper oral hygiene  Community Resource Referral / Chronic Care Management: CRR required this visit?  No   CCM required this visit?  No     Plan:     I have personally reviewed and noted the following in the patient's chart:   Medical and social history Use of alcohol, tobacco or illicit drugs  Current medications  and supplements including opioid prescriptions. Patient is not currently taking opioid prescriptions. Functional ability and status Nutritional status Physical activity Advanced directives List of other physicians Hospitalizations, surgeries, and ER visits in previous 12 months Vitals Screenings to include cognitive, depression, and falls Referrals and appointments  In addition, I have reviewed and discussed with patient certain preventive protocols, quality metrics, and best practice recommendations. A written personalized care plan for preventive services as well as general  preventive health recommendations were provided to patient.     Jordan Hawks Jadeyn Hargett, CMA   01/24/2024   After Visit Summary: (MyChart) Due to this being a telephonic visit, the after visit summary with patients personalized plan was offered to patient via MyChart   Notes: Nothing significant to report at this time.

## 2024-02-17 ENCOUNTER — Ambulatory Visit (INDEPENDENT_AMBULATORY_CARE_PROVIDER_SITE_OTHER): Admitting: Family Medicine

## 2024-02-17 ENCOUNTER — Encounter: Payer: Self-pay | Admitting: Family Medicine

## 2024-02-17 VITALS — BP 132/80 | HR 63 | Temp 98.2°F | Ht 72.0 in | Wt 203.0 lb

## 2024-02-17 DIAGNOSIS — I2510 Atherosclerotic heart disease of native coronary artery without angina pectoris: Secondary | ICD-10-CM | POA: Diagnosis not present

## 2024-02-17 DIAGNOSIS — I10 Essential (primary) hypertension: Secondary | ICD-10-CM | POA: Diagnosis not present

## 2024-02-17 DIAGNOSIS — N1831 Chronic kidney disease, stage 3a: Secondary | ICD-10-CM

## 2024-02-17 DIAGNOSIS — K219 Gastro-esophageal reflux disease without esophagitis: Secondary | ICD-10-CM | POA: Insufficient documentation

## 2024-02-17 DIAGNOSIS — E785 Hyperlipidemia, unspecified: Secondary | ICD-10-CM

## 2024-02-17 DIAGNOSIS — R252 Cramp and spasm: Secondary | ICD-10-CM | POA: Insufficient documentation

## 2024-02-17 MED ORDER — PANTOPRAZOLE SODIUM 40 MG PO TBEC: 40.0000 mg | DELAYED_RELEASE_TABLET | Freq: Every day | ORAL | 1 refills | Status: DC

## 2024-02-17 MED ORDER — METOPROLOL TARTRATE 25 MG PO TABS: 12.5000 mg | ORAL_TABLET | Freq: Two times a day (BID) | ORAL | 3 refills | Status: AC

## 2024-02-17 MED ORDER — DICLOFENAC SODIUM 1 % EX GEL: 2.0000 g | Freq: Four times a day (QID) | CUTANEOUS | 0 refills | Status: DC | PRN

## 2024-02-17 NOTE — Assessment & Plan Note (Signed)
 Advised hydration and stretching before bed.

## 2024-02-17 NOTE — Assessment & Plan Note (Signed)
Uncontrolled. Starting Protonix.

## 2024-02-17 NOTE — Assessment & Plan Note (Signed)
 At goal. Continue statin.

## 2024-02-17 NOTE — Assessment & Plan Note (Signed)
 Stable.  Continue aspirin, statin, beta-blocker, and ACE inhibitor.

## 2024-02-17 NOTE — Assessment & Plan Note (Signed)
 Recent renal function improved.  Will continue monitor.

## 2024-02-17 NOTE — Patient Instructions (Addendum)
 Follow up in 6 months.  Stretch at night.  Stop pepcid. Start Protonix.

## 2024-02-17 NOTE — Progress Notes (Signed)
 Subjective:  Patient ID: Roger [name], male    DOB: 08-10-1946  Age: 78 y.o. MRN: [id number]  CC:   Chief Complaint  Patient presents with   Follow-up    HPI:  78 year old male with coronary artery disease, hypertension, CKD stage III, and hyperlipidemia presents for follow-up.  Patient reports that he is not having any chest pain or shortness of breath.  He states that he has been having more frequent heartburn despite Pepcid.  Will discuss today.  He notes that he has been having some nocturnal cramps in his feet.  Hypertension stable on lisinopril and metoprolol.  Hyperlipidemia has been at goal on Lipitor.  Has had recent labs.  Will review these with him today.  Patient Active Problem List   Diagnosis Date Noted   Nocturnal foot cramps 02/17/2024   GERD (gastroesophageal reflux disease) 02/17/2024   Osteoarthritis, hand 09/25/2022   Stage 3a chronic kidney disease (CKD) (HCC) 03/03/2020   Essential hypertension 07/17/2014   Hyperlipidemia 07/17/2014   CAD- s/p PCI + DES to mid LAD 07/16/14 07/17/2014    Social Hx   Social History   Socioeconomic History   Marital status: Married    Spouse name: Not on file   Number of children: Not on file   Years of education: Not on file   Highest education level: Not on file  Occupational History   Not on file  Tobacco Use   Smoking status: Never   Smokeless tobacco: Never  Vaping Use   Vaping status: Never Used  Substance and Sexual Activity   Alcohol use: Yes    Alcohol/week: 2.0 standard drinks of alcohol    Types: 2 Glasses of wine per week   Drug use: No   Sexual activity: Yes  Other Topics Concern   Not on file  Social History Narrative   Not on file   Social Drivers of Health   Financial Resource Strain: Patient Declined (01/24/2024)   Overall Financial Resource Strain (CARDIA)    Difficulty of Paying Living Expenses: Patient declined  Food Insecurity: Patient Declined (01/24/2024)   Hunger Vital Sign     Worried About Running Out of Food in the Last Year: Patient declined    Ran Out of Food in the Last Year: Patient declined  Transportation Needs: Patient Declined (01/24/2024)   PRAPARE - Administrator, Civil Service (Medical): Patient declined    Lack of Transportation (Non-Medical): Patient declined  Physical Activity: Sufficiently Active (01/24/2024)   Exercise Vital Sign    Days of Exercise per Week: 5 days    Minutes of Exercise per Session: 50 min  Stress: Patient Declined (01/24/2024)   [employer] of Occupational Health - Occupational Stress Questionnaire    Feeling of Stress : Patient declined  Social Connections: Patient Declined (01/24/2024)   Social Connection and Isolation Panel [NHANES]    Frequency of Communication with Friends and Family: Patient declined    Frequency of Social Gatherings with Friends and Family: Patient declined    Attends Religious Services: Patient declined    Database administrator or Organizations: Patient declined    Attends Engineer, structural: Patient declined    Marital Status: Patient declined    Review of Systems Per HPI  Objective:  BP 132/80   Pulse 63   Temp 98.2 F (36.8 C)   Ht 6' (1.829 m)   Wt 203 lb (92.1 kg)   SpO2 99%   BMI 27.53  kg/m      02/17/2024    2:03 PM 02/17/2024    1:22 PM 01/24/2024    9:03 AM  BP/Weight  Systolic BP 132 147 --  Diastolic BP 80 79 --  Wt. (Lbs)  203 201  BMI  27.53 kg/m2 27.26 kg/m2    Physical Exam Vitals and nursing note reviewed.  Constitutional:      General: He is not in acute distress.    Appearance: Normal appearance.  HENT:     Head: Normocephalic and atraumatic.  Eyes:     General:        Right eye: No discharge.        Left eye: No discharge.     Conjunctiva/sclera: Conjunctivae normal.  Cardiovascular:     Rate and Rhythm: Normal rate and regular rhythm.  Pulmonary:     Effort: Pulmonary effort is normal.     Breath sounds: Normal breath  sounds. No wheezing, rhonchi or rales.  Neurological:     Mental Status: He is alert.  Psychiatric:        Mood and Affect: Mood normal.        Behavior: Behavior normal.     Lab Results  Component Value Date   WBC 7.5 01/14/2024   HGB 15.4 01/14/2024   HCT 46.3 01/14/2024   PLT 195 01/14/2024   GLUCOSE 110 (H) 01/14/2024   CHOL 129 01/14/2024   TRIG 94 01/14/2024   HDL 48 01/14/2024   LDLCALC 63 01/14/2024   ALT 38 01/14/2024   AST 27 01/14/2024   NA 142 01/14/2024   K 4.8 01/14/2024   CL 103 01/14/2024   CREATININE 1.44 (H) 01/14/2024   BUN 20 01/14/2024   CO2 23 01/14/2024   PSA 0.39 10/19/2014   INR 0.98 07/14/2014   HGBA1C 6.1 (H) 01/14/2024     Assessment & Plan:  Coronary artery disease involving native heart without angina pectoris, unspecified vessel or lesion type Assessment & Plan: Stable.  Continue aspirin, statin, beta-blocker, and ACE inhibitor.  Orders: -     Metoprolol Tartrate; Take 0.5 tablets (12.5 mg total) by mouth 2 (two) times daily.  Dispense: 90 tablet; Refill: 3  Essential hypertension Assessment & Plan: BP stable.  Continue lisinopril and metoprolol.  Orders: -     Metoprolol Tartrate; Take 0.5 tablets (12.5 mg total) by mouth 2 (two) times daily.  Dispense: 90 tablet; Refill: 3  Stage 3a chronic kidney disease (CKD) (HCC) Assessment & Plan: Recent renal function improved.  Will continue monitor.   Hyperlipidemia, unspecified hyperlipidemia type Assessment & Plan: At goal.  Continue statin.   Nocturnal foot cramps Assessment & Plan: Advised hydration and stretching before bed.   Gastroesophageal reflux disease without esophagitis Assessment & Plan: Uncontrolled.  Starting Protonix.  Orders: -     Pantoprazole Sodium; Take 1 tablet (40 mg total) by mouth daily.  Dispense: 90 tablet; Refill: 1  Other orders -     Diclofenac Sodium; Apply 2 g topically 4 (four) times daily as needed (Pain).  Dispense: 100 g; Refill:  0    Follow-up:  6 months  Jackqueline Aquilar Adriana Simas DO [facility]

## 2024-02-17 NOTE — Assessment & Plan Note (Signed)
BP stable.  Continue lisinopril and metoprolol.

## 2024-03-15 ENCOUNTER — Other Ambulatory Visit: Payer: Self-pay | Admitting: Family Medicine

## 2024-03-23 ENCOUNTER — Other Ambulatory Visit: Payer: Self-pay | Admitting: Family Medicine

## 2024-06-05 ENCOUNTER — Other Ambulatory Visit: Payer: Self-pay | Admitting: Family Medicine

## 2024-06-05 DIAGNOSIS — E785 Hyperlipidemia, unspecified: Secondary | ICD-10-CM

## 2024-06-05 DIAGNOSIS — I10 Essential (primary) hypertension: Secondary | ICD-10-CM

## 2024-06-23 DIAGNOSIS — N1831 Chronic kidney disease, stage 3a: Secondary | ICD-10-CM | POA: Diagnosis not present

## 2024-06-29 DIAGNOSIS — N1832 Chronic kidney disease, stage 3b: Secondary | ICD-10-CM | POA: Diagnosis not present

## 2024-06-29 DIAGNOSIS — I129 Hypertensive chronic kidney disease with stage 1 through stage 4 chronic kidney disease, or unspecified chronic kidney disease: Secondary | ICD-10-CM | POA: Diagnosis not present

## 2024-08-07 ENCOUNTER — Other Ambulatory Visit: Payer: Self-pay | Admitting: Family Medicine

## 2024-08-07 DIAGNOSIS — K219 Gastro-esophageal reflux disease without esophagitis: Secondary | ICD-10-CM

## 2024-08-14 DIAGNOSIS — M1711 Unilateral primary osteoarthritis, right knee: Secondary | ICD-10-CM | POA: Diagnosis not present

## 2024-08-21 DIAGNOSIS — M1711 Unilateral primary osteoarthritis, right knee: Secondary | ICD-10-CM | POA: Diagnosis not present

## 2024-09-23 DIAGNOSIS — M25561 Pain in right knee: Secondary | ICD-10-CM | POA: Diagnosis not present

## 2024-10-06 ENCOUNTER — Ambulatory Visit: Admitting: Family Medicine

## 2024-10-06 VITALS — BP 158/71 | HR 72 | Temp 97.3°F | Ht 72.0 in | Wt 207.0 lb

## 2024-10-06 DIAGNOSIS — J329 Chronic sinusitis, unspecified: Secondary | ICD-10-CM | POA: Insufficient documentation

## 2024-10-06 DIAGNOSIS — J0110 Acute frontal sinusitis, unspecified: Secondary | ICD-10-CM

## 2024-10-06 MED ORDER — PROMETHAZINE-DM 6.25-15 MG/5ML PO SYRP: 5.0000 mL | ORAL_SOLUTION | Freq: Four times a day (QID) | ORAL | 0 refills | Status: AC | PRN

## 2024-10-06 MED ORDER — AMOXICILLIN-POT CLAVULANATE 875-125 MG PO TABS
1.0000 | ORAL_TABLET | Freq: Two times a day (BID) | ORAL | 0 refills | Status: AC
Start: 2024-10-06 — End: ?

## 2024-10-06 NOTE — Progress Notes (Signed)
 Subjective:  Patient ID: Roger [name], male    DOB: 1946/09/04  Age: 78 y.o. MRN: [id number]  CC:   Chief Complaint  Patient presents with   Sinus Problem   Cough    HPI:  78 year old male presents for evaluation of the above.  1.5-week history of respiratory symptoms.  Reports rhinorrhea, sinus pressure and congestion, cough.  Frontal sinus pressure and pain.  No fever.  No shortness of breath.  Constantly blowing his nose.  No relief with over-the-counter treatment.   Social Hx   Social History   Socioeconomic History   Marital status: Married    Spouse name: Not on file   Number of children: Not on file   Years of education: Not on file   Highest education level: Not on file  Occupational History   Not on file  Tobacco Use   Smoking status: Never   Smokeless tobacco: Never  Vaping Use   Vaping status: Never Used  Substance and Sexual Activity   Alcohol use: Yes    Alcohol/week: 2.0 standard drinks of alcohol    Types: 2 Glasses of wine per week   Drug use: No   Sexual activity: Yes  Other Topics Concern   Not on file  Social History Narrative   Not on file   Social Drivers of Health   Financial Resource Strain: Patient Declined (01/24/2024)   Overall Financial Resource Strain (CARDIA)    Difficulty of Paying Living Expenses: Patient declined  Food Insecurity: Patient Declined (01/24/2024)   Hunger Vital Sign    Worried About Running Out of Food in the Last Year: Patient declined    Ran Out of Food in the Last Year: Patient declined  Transportation Needs: Patient Declined (01/24/2024)   PRAPARE - Administrator, Civil Service (Medical): Patient declined    Lack of Transportation (Non-Medical): Patient declined  Physical Activity: Sufficiently Active (01/24/2024)   Exercise Vital Sign    Days of Exercise per Week: 5 days    Minutes of Exercise per Session: 50 min  Stress: Patient Declined (01/24/2024)   [employer] -  Occupational Stress Questionnaire    Feeling of Stress : Patient declined  Social Connections: Patient Declined (01/24/2024)   Social Connection and Isolation Panel    Frequency of Communication with Friends and Family: Patient declined    Frequency of Social Gatherings with Friends and Family: Patient declined    Attends Religious Services: Patient declined    Database Administrator or Organizations: Patient declined    Attends Engineer, Structural: Patient declined    Marital Status: Patient declined    Review of Systems Per HPI  Objective:  BP (!) 158/71   Pulse 72   Temp (!) 97.3 F (36.3 C)   Ht 6' (1.829 m)   Wt 207 lb (93.9 kg)   SpO2 98%   BMI 28.07 kg/m      10/06/2024   11:10 AM 02/17/2024    2:03 PM 02/17/2024    1:22 PM  BP/Weight  Systolic BP 158 132 147  Diastolic BP 71 80 79  Wt. (Lbs) 207  203  BMI 28.07 kg/m2  27.53 kg/m2    Physical Exam Vitals and nursing note reviewed.  Constitutional:      General: He is not in acute distress.    Appearance: Normal appearance.  HENT:     Head: Normocephalic and atraumatic.     Mouth/Throat:  Pharynx: Oropharynx is clear.  Eyes:     General:        Right eye: No discharge.        Left eye: No discharge.     Conjunctiva/sclera: Conjunctivae normal.  Cardiovascular:     Rate and Rhythm: Normal rate and regular rhythm.  Pulmonary:     Effort: Pulmonary effort is normal.     Breath sounds: Normal breath sounds. No wheezing, rhonchi or rales.  Neurological:     Mental Status: He is alert.  Psychiatric:        Mood and Affect: Mood normal.        Behavior: Behavior normal.     Lab Results  Component Value Date   WBC 7.5 01/14/2024   HGB 15.4 01/14/2024   HCT 46.3 01/14/2024   PLT 195 01/14/2024   GLUCOSE 110 (H) 01/14/2024   CHOL 129 01/14/2024   TRIG 94 01/14/2024   HDL 48 01/14/2024   LDLCALC 63 01/14/2024   ALT 38 01/14/2024   AST 27 01/14/2024   NA 142 01/14/2024   K 4.8  01/14/2024   CL 103 01/14/2024   CREATININE 1.44 (H) 01/14/2024   BUN 20 01/14/2024   CO2 23 01/14/2024   PSA 0.39 10/19/2014   INR 0.98 07/14/2014   HGBA1C 6.1 (H) 01/14/2024     Assessment & Plan:  Acute frontal sinusitis, recurrence not specified Assessment & Plan: Treating with Augmentin .  Promethazine DM for cough.   Other orders -     Amoxicillin -Pot Clavulanate; Take 1 tablet by mouth 2 (two) times daily.  Dispense: 14 tablet; Refill: 0 -     Promethazine-DM; Take 5 mLs by mouth 4 (four) times daily as needed for cough.  Dispense: 118 mL; Refill: 0    Follow-up:  Return if symptoms worsen or fail to improve.  Jacqulyn Ahle DO [facility]

## 2024-10-06 NOTE — Assessment & Plan Note (Signed)
Treating with Augmentin.  Promethazine DM for cough. ?

## 2024-11-11 ENCOUNTER — Encounter: Payer: Self-pay | Admitting: Family Medicine

## 2024-12-05 ENCOUNTER — Other Ambulatory Visit: Payer: Self-pay | Admitting: Family Medicine

## 2024-12-05 DIAGNOSIS — E785 Hyperlipidemia, unspecified: Secondary | ICD-10-CM

## 2024-12-05 DIAGNOSIS — I10 Essential (primary) hypertension: Secondary | ICD-10-CM

## 2025-01-21 ENCOUNTER — Ambulatory Visit: Admitting: Cardiovascular Disease

## 2025-01-29 ENCOUNTER — Ambulatory Visit

## 2025-02-02 ENCOUNTER — Encounter: Admitting: Family Medicine

## 2025-02-04 ENCOUNTER — Ambulatory Visit

## 2025-02-12 ENCOUNTER — Ambulatory Visit: Admitting: Cardiovascular Disease
